# Patient Record
Sex: Female | Born: 1965 | Race: White | Hispanic: No | Marital: Married | Smoking: Never smoker
Health system: Southern US, Community
[De-identification: ages and names within clinical notes are randomized; demographics above are authoritative.]

## PROBLEM LIST (undated history)

## (undated) DIAGNOSIS — Z8619 Personal history of other infectious and parasitic diseases: Secondary | ICD-10-CM

## (undated) DIAGNOSIS — J449 Chronic obstructive pulmonary disease, unspecified: Secondary | ICD-10-CM

## (undated) DIAGNOSIS — R17 Unspecified jaundice: Secondary | ICD-10-CM

## (undated) DIAGNOSIS — K759 Inflammatory liver disease, unspecified: Secondary | ICD-10-CM

## (undated) DIAGNOSIS — IMO0001 Reserved for inherently not codable concepts without codable children: Secondary | ICD-10-CM

## (undated) DIAGNOSIS — D649 Anemia, unspecified: Secondary | ICD-10-CM

## (undated) DIAGNOSIS — F419 Anxiety disorder, unspecified: Secondary | ICD-10-CM

## (undated) DIAGNOSIS — K219 Gastro-esophageal reflux disease without esophagitis: Secondary | ICD-10-CM

## (undated) DIAGNOSIS — R51 Headache: Secondary | ICD-10-CM

## (undated) DIAGNOSIS — R011 Cardiac murmur, unspecified: Secondary | ICD-10-CM

## (undated) DIAGNOSIS — F32A Depression, unspecified: Secondary | ICD-10-CM

## (undated) DIAGNOSIS — E785 Hyperlipidemia, unspecified: Secondary | ICD-10-CM

## (undated) DIAGNOSIS — F329 Major depressive disorder, single episode, unspecified: Secondary | ICD-10-CM

## (undated) DIAGNOSIS — A389 Scarlet fever, uncomplicated: Secondary | ICD-10-CM

## (undated) DIAGNOSIS — J189 Pneumonia, unspecified organism: Secondary | ICD-10-CM

## (undated) DIAGNOSIS — Z9889 Other specified postprocedural states: Secondary | ICD-10-CM

## (undated) DIAGNOSIS — K760 Fatty (change of) liver, not elsewhere classified: Secondary | ICD-10-CM

## (undated) DIAGNOSIS — I10 Essential (primary) hypertension: Secondary | ICD-10-CM

## (undated) DIAGNOSIS — M1990 Unspecified osteoarthritis, unspecified site: Secondary | ICD-10-CM

## (undated) DIAGNOSIS — R112 Nausea with vomiting, unspecified: Secondary | ICD-10-CM

## (undated) DIAGNOSIS — R519 Headache, unspecified: Secondary | ICD-10-CM

## (undated) HISTORY — DX: Essential (primary) hypertension: I10

## (undated) HISTORY — DX: Fatty (change of) liver, not elsewhere classified: K76.0

## (undated) HISTORY — PX: APPENDECTOMY: SHX54

## (undated) HISTORY — DX: Hyperlipidemia, unspecified: E78.5

## (undated) HISTORY — DX: Depression, unspecified: F32.A

## (undated) HISTORY — DX: Chronic obstructive pulmonary disease, unspecified: J44.9

## (undated) HISTORY — DX: Gastro-esophageal reflux disease without esophagitis: K21.9

## (undated) HISTORY — DX: Major depressive disorder, single episode, unspecified: F32.9

## (undated) HISTORY — PX: KNEE SURGERY: SHX244

## (undated) HISTORY — PX: CHOLECYSTECTOMY: SHX55

## (undated) HISTORY — DX: Anemia, unspecified: D64.9

## (undated) HISTORY — DX: Anxiety disorder, unspecified: F41.9

---
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
## 1998-02-24 HISTORY — PX: DILATION AND CURETTAGE OF UTERUS: SHX78

## 1998-10-26 ENCOUNTER — Other Ambulatory Visit: Admission: RE | Admit: 1998-10-26 | Discharge: 1998-10-26 | Payer: Self-pay | Admitting: Obstetrics and Gynecology

## 1998-11-07 ENCOUNTER — Ambulatory Visit (HOSPITAL_COMMUNITY): Admission: RE | Admit: 1998-11-07 | Discharge: 1998-11-07 | Payer: Self-pay | Admitting: Obstetrics and Gynecology

## 1998-11-07 ENCOUNTER — Encounter (INDEPENDENT_AMBULATORY_CARE_PROVIDER_SITE_OTHER): Payer: Self-pay

## 2000-06-10 ENCOUNTER — Other Ambulatory Visit: Admission: RE | Admit: 2000-06-10 | Discharge: 2000-06-10 | Payer: Self-pay | Admitting: *Deleted

## 2000-06-23 ENCOUNTER — Ambulatory Visit (HOSPITAL_COMMUNITY): Admission: RE | Admit: 2000-06-23 | Discharge: 2000-06-23 | Payer: Self-pay | Admitting: *Deleted

## 2000-06-23 ENCOUNTER — Encounter: Payer: Self-pay | Admitting: *Deleted

## 2002-11-04 ENCOUNTER — Encounter: Payer: Self-pay | Admitting: Obstetrics and Gynecology

## 2002-11-04 ENCOUNTER — Ambulatory Visit (HOSPITAL_COMMUNITY): Admission: RE | Admit: 2002-11-04 | Discharge: 2002-11-04 | Payer: Self-pay | Admitting: Obstetrics and Gynecology

## 2005-04-11 ENCOUNTER — Encounter: Admission: RE | Admit: 2005-04-11 | Discharge: 2005-04-11 | Payer: Self-pay | Admitting: Obstetrics and Gynecology

## 2006-05-12 ENCOUNTER — Ambulatory Visit (HOSPITAL_COMMUNITY): Admission: RE | Admit: 2006-05-12 | Discharge: 2006-05-12 | Payer: Self-pay | Admitting: Obstetrics and Gynecology

## 2007-05-27 ENCOUNTER — Ambulatory Visit (HOSPITAL_COMMUNITY): Admission: RE | Admit: 2007-05-27 | Discharge: 2007-05-27 | Payer: Self-pay | Admitting: Obstetrics and Gynecology

## 2010-03-17 ENCOUNTER — Encounter: Payer: Self-pay | Admitting: Obstetrics and Gynecology

## 2010-04-15 ENCOUNTER — Other Ambulatory Visit: Payer: Self-pay | Admitting: Obstetrics and Gynecology

## 2010-04-15 DIAGNOSIS — Z1231 Encounter for screening mammogram for malignant neoplasm of breast: Secondary | ICD-10-CM

## 2010-05-03 ENCOUNTER — Ambulatory Visit
Admission: RE | Admit: 2010-05-03 | Discharge: 2010-05-03 | Disposition: A | Discharge status: Home / Self Care | Payer: BC Managed Care – PPO | Source: Ambulatory Visit | Attending: Obstetrics and Gynecology | Admitting: Obstetrics and Gynecology

## 2010-05-03 DIAGNOSIS — Z1231 Encounter for screening mammogram for malignant neoplasm of breast: Secondary | ICD-10-CM

## 2010-12-25 ENCOUNTER — Other Ambulatory Visit: Payer: Self-pay | Admitting: Obstetrics and Gynecology

## 2010-12-25 DIAGNOSIS — N6324 Unspecified lump in the left breast, lower inner quadrant: Secondary | ICD-10-CM

## 2011-01-08 ENCOUNTER — Ambulatory Visit
Admission: RE | Admit: 2011-01-08 | Discharge: 2011-01-08 | Disposition: A | Discharge status: Home / Self Care | Payer: BC Managed Care – PPO | Source: Ambulatory Visit | Attending: Obstetrics and Gynecology | Admitting: Obstetrics and Gynecology

## 2011-01-08 ENCOUNTER — Other Ambulatory Visit: Payer: Self-pay | Admitting: Obstetrics and Gynecology

## 2011-01-08 DIAGNOSIS — N6324 Unspecified lump in the left breast, lower inner quadrant: Secondary | ICD-10-CM

## 2011-07-03 DIAGNOSIS — K219 Gastro-esophageal reflux disease without esophagitis: Secondary | ICD-10-CM | POA: Insufficient documentation

## 2011-08-14 DIAGNOSIS — F32A Depression, unspecified: Secondary | ICD-10-CM | POA: Insufficient documentation

## 2011-10-02 ENCOUNTER — Other Ambulatory Visit (HOSPITAL_COMMUNITY): Payer: Self-pay | Admitting: Obstetrics and Gynecology

## 2011-10-02 DIAGNOSIS — Z1231 Encounter for screening mammogram for malignant neoplasm of breast: Secondary | ICD-10-CM

## 2011-10-16 ENCOUNTER — Ambulatory Visit (HOSPITAL_COMMUNITY): Payer: BC Managed Care – PPO

## 2013-03-07 ENCOUNTER — Other Ambulatory Visit: Payer: Self-pay

## 2013-03-07 DIAGNOSIS — Z1231 Encounter for screening mammogram for malignant neoplasm of breast: Secondary | ICD-10-CM

## 2013-03-28 ENCOUNTER — Ambulatory Visit
Admission: RE | Admit: 2013-03-28 | Discharge: 2013-03-28 | Disposition: A | Discharge status: Home / Self Care | Payer: Federal, State, Local not specified - PPO | Source: Ambulatory Visit

## 2013-03-28 DIAGNOSIS — Z1231 Encounter for screening mammogram for malignant neoplasm of breast: Secondary | ICD-10-CM

## 2013-04-01 ENCOUNTER — Other Ambulatory Visit: Payer: Self-pay | Admitting: Obstetrics and Gynecology

## 2013-04-01 DIAGNOSIS — R928 Other abnormal and inconclusive findings on diagnostic imaging of breast: Secondary | ICD-10-CM

## 2013-04-07 ENCOUNTER — Ambulatory Visit
Admission: RE | Admit: 2013-04-07 | Discharge: 2013-04-07 | Disposition: A | Discharge status: Home / Self Care | Payer: Federal, State, Local not specified - PPO | Source: Ambulatory Visit | Attending: Obstetrics and Gynecology | Admitting: Obstetrics and Gynecology

## 2013-04-07 DIAGNOSIS — R928 Other abnormal and inconclusive findings on diagnostic imaging of breast: Secondary | ICD-10-CM

## 2013-11-03 ENCOUNTER — Other Ambulatory Visit: Payer: Self-pay | Admitting: Obstetrics and Gynecology

## 2013-11-03 DIAGNOSIS — N6001 Solitary cyst of right breast: Secondary | ICD-10-CM

## 2013-11-10 ENCOUNTER — Ambulatory Visit
Admission: RE | Admit: 2013-11-10 | Discharge: 2013-11-10 | Disposition: A | Discharge status: Home / Self Care | Payer: Federal, State, Local not specified - PPO | Source: Ambulatory Visit | Attending: Obstetrics and Gynecology | Admitting: Obstetrics and Gynecology

## 2013-11-10 DIAGNOSIS — N6001 Solitary cyst of right breast: Secondary | ICD-10-CM

## 2014-02-28 DIAGNOSIS — G4710 Hypersomnia, unspecified: Secondary | ICD-10-CM | POA: Insufficient documentation

## 2014-02-28 DIAGNOSIS — R0683 Snoring: Secondary | ICD-10-CM | POA: Insufficient documentation

## 2014-02-28 DIAGNOSIS — R5383 Other fatigue: Secondary | ICD-10-CM | POA: Insufficient documentation

## 2014-07-19 ENCOUNTER — Other Ambulatory Visit: Payer: Self-pay | Admitting: Obstetrics and Gynecology

## 2014-07-19 DIAGNOSIS — N63 Unspecified lump in unspecified breast: Secondary | ICD-10-CM

## 2014-07-26 ENCOUNTER — Ambulatory Visit
Admission: RE | Admit: 2014-07-26 | Discharge: 2014-07-26 | Disposition: A | Discharge status: Home / Self Care | Payer: Federal, State, Local not specified - PPO | Source: Ambulatory Visit | Attending: Obstetrics and Gynecology | Admitting: Obstetrics and Gynecology

## 2014-07-26 DIAGNOSIS — N63 Unspecified lump in unspecified breast: Secondary | ICD-10-CM

## 2014-11-13 ENCOUNTER — Ambulatory Visit: Payer: Federal, State, Local not specified - PPO | Admitting: Internal Medicine

## 2014-12-15 ENCOUNTER — Other Ambulatory Visit (INDEPENDENT_AMBULATORY_CARE_PROVIDER_SITE_OTHER): Payer: Federal, State, Local not specified - PPO

## 2014-12-15 ENCOUNTER — Encounter: Payer: Self-pay | Admitting: Internal Medicine

## 2014-12-15 ENCOUNTER — Ambulatory Visit (INDEPENDENT_AMBULATORY_CARE_PROVIDER_SITE_OTHER): Payer: Federal, State, Local not specified - PPO | Admitting: Internal Medicine

## 2014-12-15 DIAGNOSIS — I10 Essential (primary) hypertension: Secondary | ICD-10-CM | POA: Diagnosis not present

## 2014-12-15 DIAGNOSIS — Z1211 Encounter for screening for malignant neoplasm of colon: Secondary | ICD-10-CM

## 2014-12-15 LAB — COMPREHENSIVE METABOLIC PANEL
ALBUMIN: 4.2 g/dL (ref 3.5–5.2)
ALT: 17 U/L (ref 0–35)
AST: 16 U/L (ref 0–37)
Alkaline Phosphatase: 70 U/L (ref 39–117)
BUN: 11 mg/dL (ref 6–23)
CHLORIDE: 103 meq/L (ref 96–112)
CO2: 29 meq/L (ref 19–32)
CREATININE: 0.73 mg/dL (ref 0.40–1.20)
Calcium: 9.5 mg/dL (ref 8.4–10.5)
GFR: 89.66 mL/min (ref >60.00)
GLUCOSE: 97 mg/dL (ref 70–99)
POTASSIUM: 4 meq/L (ref 3.5–5.1)
SODIUM: 142 meq/L (ref 135–145)
Total Bilirubin: 0.4 mg/dL (ref 0.2–1.2)
Total Protein: 7.7 g/dL (ref 6.0–8.3)

## 2014-12-15 LAB — LIPID PANEL
CHOL/HDL RATIO: 6
CHOLESTEROL: 235 mg/dL — AB (ref 0–200)
HDL: 40.3 mg/dL (ref >39.00)
LDL CALC: 164 mg/dL — AB (ref 0–99)
NonHDL: 195.14
TRIGLYCERIDES: 158 mg/dL — AB (ref 0.0–149.0)
VLDL: 31.6 mg/dL (ref 0.0–40.0)

## 2014-12-15 LAB — CBC
HEMATOCRIT: 39 % (ref 36.0–46.0)
HEMOGLOBIN: 12.4 g/dL (ref 12.0–15.0)
MCHC: 31.9 g/dL (ref 30.0–36.0)
MCV: 82.8 fl (ref 78.0–100.0)
Platelets: 315 10*3/uL (ref 150.0–400.0)
RBC: 4.71 Mil/uL (ref 3.87–5.11)
RDW: 17.1 % — AB (ref 11.5–15.5)
WBC: 9.9 10*3/uL (ref 4.0–10.5)

## 2014-12-15 LAB — HEMOGLOBIN A1C: HEMOGLOBIN A1C: 5.9 % (ref 4.6–6.5)

## 2014-12-15 LAB — TSH: TSH: 2.99 u[IU]/mL (ref 0.35–4.50)

## 2014-12-15 MED ORDER — SERTRALINE HCL 50 MG PO TABS: 50.0000 mg | ORAL_TABLET | Freq: Every day | ORAL | Status: DC

## 2014-12-15 NOTE — Progress Notes (Signed)
Pre visit review using our clinic review tool, if applicable. No additional management support is needed unless otherwise documented below in the visit note. 

## 2014-12-15 NOTE — Progress Notes (Signed)
   Subjective:    Patient ID: Christine Ramirez, female    DOB: Jun 21, 1965, 49 y.o.   MRN: [id number]  HPI The patient is a new 49 YO female coming in for her morbid obesity. This is limiting her ADLS (cannot tie her shoes) and her mobility (no energy to do anything and legs weak and not well able to support her). She has been told in the past that she had borderline sugars (have not been checked in almost a year). She does eat poorly and does not feel able to resist cravings for sodas and foods. She does work with limited access to food preparation and is forced to eat foods that are not good for her at work. Her husband has recently had cancer which caused a lot of stress and stress eating.   Review of Systems  Constitutional: Positive for activity change, appetite change and unexpected weight change. Negative for fever, chills and fatigue.  HENT: Negative.   Eyes: Negative.   Respiratory: Positive for shortness of breath. Negative for cough, chest tightness and wheezing.   Cardiovascular: Negative for chest pain, palpitations and leg swelling.  Gastrointestinal: Negative for abdominal pain, diarrhea, constipation, blood in stool and abdominal distention.  Musculoskeletal: Positive for myalgias, back pain, arthralgias and gait problem.  Skin: Negative.   Neurological: Negative.   Psychiatric/Behavioral: Negative.       Objective:   Physical Exam  Constitutional: She is oriented to person, place, and time. She appears well-developed and well-nourished.  Overweight  HENT:  Head: Normocephalic and atraumatic.  Eyes: EOM are normal.  Neck: Normal range of motion.  Cardiovascular: Normal rate and regular rhythm.   Pulmonary/Chest: Effort normal. No respiratory distress. She has no wheezes. She has no rales.  Decreased sounds due to habitus  Abdominal: Soft. Bowel sounds are normal. She exhibits no distension. There is no tenderness. There is no rebound.  Musculoskeletal: She exhibits no  edema.  Neurological: She is alert and oriented to person, place, and time. Coordination normal.  Skin: Skin is warm and dry.  Psychiatric: She has a normal mood and affect.   Filed Vitals:   12/15/14 1535  BP: 134/82  Pulse: 74  Temp: 98.1 F (36.7 C)  TempSrc: Oral  Resp: 20  Height: 5\' 1"  (1.549 m)  Weight: 296 lb 1.9 oz (134.319 kg)  SpO2: 98%      Assessment & Plan:

## 2014-12-15 NOTE — Patient Instructions (Signed)
We will check the blood work and have given you the information about the bariatric clinic. Their website is on the bottom of the page.

## 2014-12-15 NOTE — Assessment & Plan Note (Signed)
Currently controlled on lisinopril 5 mg daily and checking labs today. Adjust as needed.

## 2014-12-15 NOTE — Assessment & Plan Note (Signed)
Given information about the bariatric center at [facility]. Checking labs today but complicated by osteoarthritis and likely OSA (has not had sleep study but suspected, not treated), and at least impaired fasting sugars. She does not feel able to start the journey since she has so far to go. Talked to her about more activity to build up her strength. She has thought about bariatric surgery in the past.

## 2014-12-18 LAB — T4, FREE: Free T4: 0.68 ng/dL (ref 0.60–1.60)

## 2014-12-18 LAB — VITAMIN B12: Vitamin B-12: 133 pg/mL — ABNORMAL LOW (ref 211–911)

## 2014-12-27 ENCOUNTER — Telehealth: Payer: Self-pay | Admitting: Internal Medicine

## 2014-12-27 NOTE — Telephone Encounter (Signed)
Her referral was placed for colonoscopy at her visit and it can take several weeks to hear back about scheduling. We did check for thyroid conditions and they were normal. Would recommend taking over the counter B12 1000 mcg daily to help with low B12 level.

## 2014-12-27 NOTE — Telephone Encounter (Signed)
Patient requesting referral for a colonoscopy.  She is also wanting to know if Dr. Okey Duprerawford tested for Thyroid conditions and if so what the results were.  Also, Dr. Okey Duprerawford stated that her B12 was low. Does she need to do or take anything in regard to this or not worry about it at this time?

## 2014-12-27 NOTE — Telephone Encounter (Signed)
Patient aware and will wait to hear about her colonoscopy and will go pick up the OTC B12 .

## 2015-01-12 ENCOUNTER — Encounter: Payer: Self-pay | Admitting: Gastroenterology

## 2015-01-26 ENCOUNTER — Other Ambulatory Visit (INDEPENDENT_AMBULATORY_CARE_PROVIDER_SITE_OTHER): Payer: Federal, State, Local not specified - PPO

## 2015-01-26 ENCOUNTER — Encounter: Payer: Self-pay | Admitting: Internal Medicine

## 2015-01-26 ENCOUNTER — Ambulatory Visit (INDEPENDENT_AMBULATORY_CARE_PROVIDER_SITE_OTHER): Payer: Federal, State, Local not specified - PPO | Admitting: Internal Medicine

## 2015-01-26 VITALS — BP 126/82 | HR 79 | Temp 97.3°F | Resp 20 | Ht 62.0 in | Wt 302.2 lb

## 2015-01-26 DIAGNOSIS — J209 Acute bronchitis, unspecified: Secondary | ICD-10-CM | POA: Diagnosis not present

## 2015-01-26 DIAGNOSIS — R1011 Right upper quadrant pain: Secondary | ICD-10-CM

## 2015-01-26 LAB — CBC WITH DIFFERENTIAL/PLATELET
BASOS ABS: 0 10*3/uL (ref 0.0–0.1)
Basophils Relative: 0.4 % (ref 0.0–3.0)
EOS ABS: 0.2 10*3/uL (ref 0.0–0.7)
Eosinophils Relative: 2.5 % (ref 0.0–5.0)
HEMATOCRIT: 38.2 % (ref 36.0–46.0)
HEMOGLOBIN: 12.3 g/dL (ref 12.0–15.0)
LYMPHS PCT: 20.9 % (ref 12.0–46.0)
Lymphs Abs: 2 10*3/uL (ref 0.7–4.0)
MCHC: 32.3 g/dL (ref 30.0–36.0)
MCV: 82.7 fl (ref 78.0–100.0)
MONOS PCT: 7.1 % (ref 3.0–12.0)
Monocytes Absolute: 0.7 10*3/uL (ref 0.1–1.0)
NEUTROS ABS: 6.5 10*3/uL (ref 1.4–7.7)
Neutrophils Relative %: 69.1 % (ref 43.0–77.0)
Platelets: 330 10*3/uL (ref 150.0–400.0)
RBC: 4.61 Mil/uL (ref 3.87–5.11)
RDW: 17.2 % — ABNORMAL HIGH (ref 11.5–15.5)
WBC: 9.3 10*3/uL (ref 4.0–10.5)

## 2015-01-26 LAB — COMPREHENSIVE METABOLIC PANEL
ALBUMIN: 3.9 g/dL (ref 3.5–5.2)
ALT: 17 U/L (ref 0–35)
AST: 14 U/L (ref 0–37)
Alkaline Phosphatase: 65 U/L (ref 39–117)
BUN: 10 mg/dL (ref 6–23)
CALCIUM: 9.2 mg/dL (ref 8.4–10.5)
CHLORIDE: 105 meq/L (ref 96–112)
CO2: 27 mEq/L (ref 19–32)
CREATININE: 0.84 mg/dL (ref 0.40–1.20)
GFR: 76.22 mL/min (ref >60.00)
Glucose, Bld: 94 mg/dL (ref 70–99)
POTASSIUM: 4.2 meq/L (ref 3.5–5.1)
SODIUM: 139 meq/L (ref 135–145)
Total Bilirubin: 0.4 mg/dL (ref 0.2–1.2)
Total Protein: 7.4 g/dL (ref 6.0–8.3)

## 2015-01-26 LAB — URINALYSIS, ROUTINE W REFLEX MICROSCOPIC
Bilirubin Urine: NEGATIVE
KETONES UR: NEGATIVE
Leukocytes, UA: NEGATIVE
NITRITE: NEGATIVE
SPECIFIC GRAVITY, URINE: 1.02 (ref 1.000–1.030)
TOTAL PROTEIN, URINE-UPE24: NEGATIVE
URINE GLUCOSE: NEGATIVE
UROBILINOGEN UA: 0.2 (ref 0.0–1.0)
pH: 6 (ref 5.0–8.0)

## 2015-01-26 MED ORDER — DOXYCYCLINE HYCLATE 100 MG PO TABS: 100.0000 mg | ORAL_TABLET | Freq: Two times a day (BID) | ORAL | Status: DC

## 2015-01-26 NOTE — Progress Notes (Signed)
Pre visit review using our clinic review tool, if applicable. No additional management support is needed unless otherwise documented below in the visit note. 

## 2015-01-26 NOTE — Patient Instructions (Addendum)
   Test(s) ordered today. Your results will be released to MyChart (or called to you) after review, usually within 72hours after test completion. If any changes need to be made, you will be notified at that same time.  An antibiotic was sent to your pharmacy for possible bronchitis/pneumonia.  If your symptoms do not improve, call or return.   An ultrasound was ordered and we will call you to schedule.

## 2015-01-26 NOTE — Progress Notes (Signed)
Subjective:    Patient ID: Christine Ramirez, female    DOB: [birth year], 49 y.o.   MRN: [id number]  HPI She is here for an acute visit for abdominal pain, cough and shortness of breath.  Her abdominal pain started a month or so ago.  She noticed it more when she is more active and the past couple of days it has been more constant.  She denies radiation.  There is no relation to eating or type of food.  Nothing seems to help.   She has also been getting sob with activity.  She has chronic sob which she knows is likely related to her weight, but the past week it has been worse.  She has been coughing.   The cough is productive, but there is minimal phlegm.  She has heard some wheezing, but it is mild.  She has had bronchitis in the past and is concerned about the possibility of pneumonia, especially with the RUQ pain that could be at her lung base.       Medications and allergies reviewed with patient and not updated - she is currently also taking buprofen 800 mg daily to every other day for generalized pain.  Patient Active Problem List   Diagnosis Date Noted  . Morbid obesity (HCC) 12/15/2014  . Essential hypertension 12/15/2014    Current Outpatient Prescriptions on File Prior to Visit  Medication Sig Dispense Refill  . lisinopril (PRINIVIL,ZESTRIL) 5 MG tablet TAKE 1 TABLET BY MOUTH EVERY DAY    . sertraline (ZOLOFT) 50 MG tablet Take 1 tablet (50 mg total) by mouth daily. 90 tablet 3   No current facility-administered medications on file prior to visit.    Past Medical History  Diagnosis Date  . Depression   . Anxiety   . Anemia   . COPD (chronic obstructive pulmonary disease) (HCC)   . GERD (gastroesophageal reflux disease)   . Hyperlipidemia   . Hypertension     Past Surgical History  Procedure Laterality Date  . Appendectomy    . Knee surgery Right     Social History   Social History  . Marital Status: Married    Spouse Name: N/A  . Number of Children: N/A   . Years of Education: N/A   Social History Main Topics  . Smoking status: Never Smoker   . Smokeless tobacco: None  . Alcohol Use: No  . Drug Use: No  . Sexual Activity: Not Asked   Other Topics Concern  . None   Social History Narrative    Review of Systems  Constitutional: Positive for fatigue. Negative for fever and appetite change.  HENT: Positive for trouble swallowing (occasionally). Negative for congestion, ear pain, sinus pressure and sore throat.   Respiratory: Positive for cough, shortness of breath and wheezing.   Gastrointestinal: Positive for abdominal pain (dull, non radiated in RUQ) and blood in stool (likley hemorhoidal ). Negative for nausea, vomiting, diarrhea and constipation.       GERD 2-3 / month  Genitourinary: Negative for dysuria, frequency and hematuria.  Neurological: Positive for headaches. Negative for light-headedness.       Objective:   Filed Vitals:   01/26/15 1622  BP: 126/82  Pulse: 79  Temp: 97.3 F (36.3 C)  Resp: 20   Filed Weights   01/26/15 1622  Weight: 302 lb 4 oz (137.1 kg)   Body mass index is 55.27 kg/(m^2).   Physical Exam  Constitutional: She appears well-developed and well-nourished.  HENT:  Head: Normocephalic and atraumatic.  Right Ear: External ear normal.  Left Ear: External ear normal.  Mouth/Throat: Oropharynx is clear and moist.  Eyes: Conjunctivae are normal.  Neck: Neck supple. No tracheal deviation present. No thyromegaly present.  Cardiovascular: Normal rate, regular rhythm and normal heart sounds.   No murmur heard. Pulmonary/Chest: Effort normal and breath sounds normal. No stridor. No respiratory distress. She has no wheezes. She has no rales.  Moderate sob with exertion  - somewhat weight related  Abdominal: Soft. There is tenderness (RUQ). There is no rebound and no guarding.  Morbidly obese  Musculoskeletal: She exhibits edema (trace).  Lymphadenopathy:    She has no cervical adenopathy.         Assessment & Plan:   RUQ pain, sob (acute on chronic), wheeze, cough Her lung exam is normal and I do not think she has PNA, but she may have some bronchitis - we will start doxycyline today Her RUQ pain needs to be evaluated further  - she is mildly tender on exam Check urine for infection and blood to rule out UTI, kidney stone Check abdominal US to rule out fatty liver, gallstones or other liver pathology Check basic blood work Depending on how she feels after the medication and her above results we will determine if further evaluation is needed

## 2015-01-28 ENCOUNTER — Telehealth: Payer: Self-pay | Admitting: Internal Medicine

## 2015-01-28 DIAGNOSIS — R3129 Other microscopic hematuria: Secondary | ICD-10-CM

## 2015-01-28 LAB — URINE CULTURE

## 2015-01-28 NOTE — Telephone Encounter (Signed)
Let her know her urine shows some blood, but no obvious infection.  I would like her to give a repeat sample to recheck for blood.  (ordered).  Her kidney and liver tests are normal.  Her blood counts are normal.  Please see how she is feeling.

## 2015-01-29 NOTE — Telephone Encounter (Signed)
Called pt to inform. Pt will be coming in this week for UA. Pt is still having pain, she has scheduled the US for 02/08/15

## 2015-02-01 ENCOUNTER — Other Ambulatory Visit (INDEPENDENT_AMBULATORY_CARE_PROVIDER_SITE_OTHER): Payer: Federal, State, Local not specified - PPO

## 2015-02-01 DIAGNOSIS — R3129 Other microscopic hematuria: Secondary | ICD-10-CM

## 2015-02-01 LAB — URINALYSIS, ROUTINE W REFLEX MICROSCOPIC
BILIRUBIN URINE: NEGATIVE
Ketones, ur: NEGATIVE
NITRITE: NEGATIVE
PH: 6 (ref 5.0–8.0)
Specific Gravity, Urine: 1.025 (ref 1.000–1.030)
TOTAL PROTEIN, URINE-UPE24: NEGATIVE
URINE GLUCOSE: NEGATIVE
UROBILINOGEN UA: 0.2 (ref 0.0–1.0)

## 2015-02-02 LAB — URINE CULTURE: Colony Count: 30000

## 2015-02-04 ENCOUNTER — Telehealth: Payer: Self-pay | Admitting: Internal Medicine

## 2015-02-04 NOTE — Telephone Encounter (Signed)
Urine shows a little blood, it was not a great sample, but there was no obvious infection.  Any urinary symptoms now?  Is she still having the abdominal discomfort?

## 2015-02-05 NOTE — Telephone Encounter (Signed)
Spoke with pt. Pt stated that she has finished the antibiotic as of today and has an ultra sound scheduled for 02/08/15. Pt is still having abdominal pain. I instructed pt to call back if it became worse before her ultrasound appt.

## 2015-02-08 ENCOUNTER — Encounter (INDEPENDENT_AMBULATORY_CARE_PROVIDER_SITE_OTHER): Payer: Self-pay

## 2015-02-08 ENCOUNTER — Ambulatory Visit
Admission: RE | Admit: 2015-02-08 | Discharge: 2015-02-08 | Disposition: A | Discharge status: Home / Self Care | Payer: Federal, State, Local not specified - PPO | Source: Ambulatory Visit | Attending: Internal Medicine | Admitting: Internal Medicine

## 2015-02-08 DIAGNOSIS — R1011 Right upper quadrant pain: Secondary | ICD-10-CM

## 2015-02-09 ENCOUNTER — Telehealth: Payer: Self-pay | Admitting: Internal Medicine

## 2015-02-09 ENCOUNTER — Telehealth: Payer: Self-pay | Admitting: Emergency Medicine

## 2015-02-09 DIAGNOSIS — K8020 Calculus of gallbladder without cholecystitis without obstruction: Secondary | ICD-10-CM

## 2015-02-09 MED ORDER — HYDROCODONE-ACETAMINOPHEN 5-325 MG PO TABS: 1.0000 | ORAL_TABLET | ORAL | Status: DC | PRN

## 2015-02-09 NOTE — Telephone Encounter (Signed)
Pt aware that rx is ready for pick up  

## 2015-02-09 NOTE — Telephone Encounter (Signed)
Pt called into office okaying RX to be picked up by brother.

## 2015-02-09 NOTE — Telephone Encounter (Signed)
See result note.  Will refer to surgery - urgent referral given pain.  rx for vicodin printed.

## 2015-02-14 ENCOUNTER — Telehealth: Payer: Self-pay | Admitting: Emergency Medicine

## 2015-02-14 NOTE — Telephone Encounter (Signed)
Pt called regarding general surgery referral. I informed pt of appt date and time. Pt also requesting that a refill be sent in for her Lisinopril

## 2015-02-21 ENCOUNTER — Other Ambulatory Visit: Payer: Self-pay | Admitting: Emergency Medicine

## 2015-02-21 MED ORDER — LISINOPRIL 5 MG PO TABS: 5.0000 mg | ORAL_TABLET | Freq: Every day | ORAL | Status: DC

## 2015-03-01 ENCOUNTER — Telehealth: Payer: Self-pay

## 2015-03-01 NOTE — Telephone Encounter (Signed)
Plan on calling pt later in the morning.  If her wt is still up high enough to maintain BMI >50, would you like her to be a direct hospital case or OV first?  Thank you, Marylene Landngela

## 2015-03-01 NOTE — Telephone Encounter (Signed)
Office visit first would be good. thanks

## 2015-03-02 NOTE — Telephone Encounter (Signed)
Called pt.Informed pt we would need to schedule an office visit to discuss her colonoscopy due to elevated BMI.Pt states she has been having some medical problems with blood in her urine ,abd pain and she has gallstones. She is scheduled to see a surgeon next week to discuss surgery.  The pt wants to  take care of these other medical problems before scheduling a colonoscopy or office visit.She will call back to schedule an office visit with the doctor once she gets medical clearance.The pre-visit on 03/06/15 was cancelled.The pt understood.

## 2015-03-12 ENCOUNTER — Ambulatory Visit: Payer: Self-pay | Admitting: General Surgery

## 2015-03-12 NOTE — H&P (Signed)
Christine Ramirez 03/01/2015 9:52 AM [facility] Patient #: [id number] DOB: 08/10/1965 Married / Language: English / Race: White Female   History of Present Illness (Sheridan Hew M. Monroe Qin MD; 03/01/2015 10:24 AM) Patient words: GB.  The patient is a 49 year old female who presents with symptomatic choledocholithiasis. She is referred by Dr. Burns to discuss upper abdominal pain. She states that she has had several months of epigastric and right upper quadrant pain radiating to her back. It was initially intermittent but now it is more frequent and almost constant. It is now affecting her ability to work. She states she just hasn't felt good. She denies any fevers or chills. She denies any nausea or vomiting. She denies any jaundice. She has been taking some narcotics that her primary care physician gave her. Interestingly she states the pain is worse with activity. She does report that eating can also increase the discomfort. She also feels bloated. She has had a prior upper midline incision. She states that she had exploratory surgery when she was 50 years old because she had scarlet fever and hepatitis. She states that she had infection around her gallbladder at that time but they did not remove her gallbladder. They treated it with antibiotics. She does have shortness of breath with activity. She states that she probably has sleep apnea but was unable to attend the sleep study due to inclement weather   Problem List/Past Medical (Kevon Tench M Mikisha Roseland, MD; 03/01/2015 10:24 AM) SYMPTOMATIC CHOLELITHIASIS (K80.20)  Other Problems (Lucyann Romano M Jeani Fassnacht, MD; 03/01/2015 10:24 AM) Anxiety Disorder Arthritis Back Pain Cholelithiasis Chronic Obstructive Lung Disease Depression Gastroesophageal Reflux Disease Hepatitis High blood pressure Hypercholesterolemia Other disease, cancer, significant illness Sleep Apnea  Past Surgical History (Ammie Eversole, LPN; 03/01/2015  9:52 AM) Appendectomy Knee Surgery Right.  Diagnostic Studies History (Ammie Eversole, LPN; 03/01/2015 9:52 AM) Colonoscopy never Mammogram within last year Pap Smear 1-5 years ago  Allergies (Ammie Eversole, LPN; 03/01/2015 9:53 AM) No Known Drug Allergies01/06/2015  Medication History (Ammie Eversole, LPN; 03/01/2015 9:53 AM) Norco (5-325MG Tablet, Oral) Active. Lisinopril (5MG Tablet, Oral) Active. Zoloft (50MG Tablet, Oral) Active. Medications Reconciled  Social History (Ammie Eversole, LPN; 03/01/2015 9:52 AM) Alcohol use Occasional alcohol use. Caffeine use Carbonated beverages, Tea. No drug use Tobacco use Never smoker.  Family History (Ammie Eversole, LPN; 03/01/2015 9:52 AM) Alcohol Abuse Family Members In General. Arthritis Father, Mother. Bleeding disorder Family Members In General. Breast Cancer Family Members In General, Mother. Cancer Family Members In General. Melanoma Family Members In General, Mother.  Pregnancy / Birth History (Ammie Eversole, LPN; 03/01/2015 9:52 AM) Age at menarche 11 years. Gravida 1 Irregular periods Maternal age 31-35 Para 0    Review of Systems (Ammie Eversole LPN; 03/01/2015 9:52 AM) General Present- Appetite Loss, Chills, Fatigue and Night Sweats. Not Present- Fever, Weight Gain and Weight Loss. Skin Present- Dryness. Not Present- Change in Wart/Mole, Hives, Jaundice, New Lesions, Non-Healing Wounds, Rash and Ulcer. HEENT Present- Wears glasses/contact lenses. Not Present- Earache, Hearing Loss, Hoarseness, Nose Bleed, Oral Ulcers, Ringing in the Ears, Seasonal Allergies, Sinus Pain, Sore Throat, Visual Disturbances and Yellow Eyes. Respiratory Present- Snoring and Wheezing. Not Present- Bloody sputum, Chronic Cough and Difficulty Breathing. Cardiovascular Present- Leg Cramps, Shortness of Breath and Swelling of Extremities. Not Present- Chest Pain, Difficulty Breathing Lying Down, Palpitations and Rapid Heart  Rate. Gastrointestinal Present- Abdominal Pain, Bloating, Excessive gas, Gets full quickly at meals, Indigestion and Nausea. Not Present- Bloody Stool, Change in Bowel Habits, Chronic   diarrhea, Constipation, Difficulty Swallowing, Hemorrhoids, Rectal Pain and Vomiting. Female Genitourinary Present- Urgency. Not Present- Frequency, Nocturia, Painful Urination and Pelvic Pain. Musculoskeletal Present- Back Pain, Joint Pain, Joint Stiffness, Muscle Pain, Muscle Weakness and Swelling of Extremities. Neurological Present- Headaches, Numbness, Tingling and Weakness. Not Present- Decreased Memory, Fainting, Seizures, Tremor and Trouble walking. Psychiatric Not Present- Anxiety, Bipolar, Change in Sleep Pattern, Depression, Fearful and Frequent crying. Endocrine Not Present- Cold Intolerance, Excessive Hunger, Hair Changes, Heat Intolerance, Hot flashes and New Diabetes. Hematology Not Present- Easy Bruising, Excessive bleeding, Gland problems, HIV and Persistent Infections.  Vitals (Ammie Eversole LPN; 03/01/2015 9:52 AM) 03/01/2015 9:52 AM Weight: 303.2 lb Height: 61in Body Surface Area: 2.25 m Body Mass Index: 57.29 kg/m  Temp.: 97.6F(Oral)  Pulse: 72 (Regular)  BP: 124/74 (Sitting, Left Arm, Standard)       Physical Exam (Reily Ilic M. Deneisha Dade MD; 03/01/2015 10:21 AM) General Mental Status-Alert. General Appearance-Consistent with stated age. Hydration-Well hydrated. Voice-Normal. Note: morbidly obese   Head and Neck Head-normocephalic, atraumatic with no lesions or palpable masses. Trachea-midline. Thyroid Gland Characteristics - normal size and consistency.  Eye Eyeball - Bilateral-Extraocular movements intact. Sclera/Conjunctiva - Bilateral-No scleral icterus.  Chest and Lung Exam Chest and lung exam reveals -quiet, even and easy respiratory effort with no use of accessory muscles and on auscultation, normal breath sounds, no adventitious sounds and  normal vocal resonance. Inspection Chest Wall - Normal. Back - normal.  Breast - Did not examine.  Cardiovascular Cardiovascular examination reveals -normal heart sounds, regular rate and rhythm with no murmurs and normal pedal pulses bilaterally.  Abdomen Inspection Inspection of the abdomen reveals - No Hernias. Skin - Scar - Note: upper midline scar. Palpation/Percussion Palpation and Percussion of the abdomen reveal - Soft, Non Tender, No Rebound tenderness, No Rigidity (guarding) and No hepatosplenomegaly. Auscultation Auscultation of the abdomen reveals - Bowel sounds normal.  Peripheral Vascular Upper Extremity Palpation - Pulses bilaterally normal.  Neurologic Neurologic evaluation reveals -alert and oriented x 3 with no impairment of recent or remote memory. Mental Status-Normal.  Neuropsychiatric The patient's mood and affect are described as -normal. Judgment and Insight-insight is appropriate concerning matters relevant to self.  Musculoskeletal Normal Exam - Left-Upper Extremity Strength Normal and Lower Extremity Strength Normal. Normal Exam - Right-Upper Extremity Strength Normal and Lower Extremity Strength Normal.  Lymphatic Head & Neck  General Head & Neck Lymphatics: Bilateral - Description - Normal. Axillary - Did not examine. Femoral & Inguinal - Did not examine.    Assessment & Plan (Denzel Etienne M. Brennen Camper MD; 03/01/2015 10:24 AM) SYMPTOMATIC CHOLELITHIASIS (K80.20) Impression: I believe the patient's symptoms are consistent with gallbladder disease.  We discussed gallbladder disease. The patient was given educational material. We discussed non-operative and operative management. We discussed the signs & symptoms of acute cholecystitis  I discussed laparoscopic cholecystectomy with IOC in detail. The patient was given educational material as well as diagrams detailing the procedure. We discussed the risks and benefits of a laparoscopic  cholecystectomy including, but not limited to bleeding, infection, injury to surrounding structures such as the intestine or liver, bile leak, retained gallstones, need to convert to an open procedure, prolonged diarrhea, blood clots such as DVT, common bile duct injury, anesthesia risks, and possible need for additional procedures. We did discuss because of her prior upper midline incision that she is slightly higher risk for conversion to open as well as injury to surrounding structures. We discussed the typical post-operative recovery course. I explained that the likelihood of improvement   of their symptoms is good.  The patient has elected to proceed with surgery. I did encourage her to follow with her PCP about getting rescheduled for sleep study Current Plans You are being scheduled for surgery - Our schedulers will call you.  You should hear from our office's scheduling department within 5 working days about the location, date, and time of surgery. We try to make accommodations for patient's preferences in scheduling surgery, but sometimes the OR schedule or the surgeon's schedule prevents us from making those accommodations.  If you have not heard from our office ([phone]) in 5 working days, call the office and ask for your surgeon's nurse.  If you have other questions about your diagnosis, plan, or surgery, call the office and ask for your surgeon's nurse.  Pt Education - Pamphlet Given - Laparoscopic Gallbladder Surgery: discussed with patient and provided information. Pt Education - [facility] Laparosopic Post Op HCI ([place])  Shahir Karen M. Maximino Cozzolino, MD, FACS General, Bariatric, & Minimally Invasive Surgery [facility]  

## 2015-03-13 NOTE — Patient Instructions (Addendum)
DAISIE HAFT  03/13/2015   Your procedure is scheduled on: Monday 03/19/2015  Report to [facility] Main  Entrance take [place]  elevators to 3rd floor to  Short Stay Center at  0745  AM.  Call this number if you have problems the morning of surgery [phone]   Remember: ONLY 1 PERSON MAY GO WITH YOU TO SHORT STAY TO GET  READY MORNING OF YOUR SURGERY.   Do not eat food or drink liquids :After Midnight.     Take these medicines the morning of surgery with A SIP OF WATER: none                               You may not have any metal on your body including hair pins and              piercings  Do not wear jewelry, make-up, lotions, powders or perfumes, deodorant             Do not wear nail polish.  Do not shave  48 hours prior to surgery.              Men may shave face and neck.   Do not bring valuables to the hospital. [facility] IS NOT             RESPONSIBLE   FOR VALUABLES.  Contacts, dentures or bridgework may not be worn into surgery.  Leave suitcase in the car. After surgery it may be brought to your room.                   Please read over the following fact sheets you were given: _____________________________________________________________________             [facility] - Preparing for Surgery Before surgery, you can play an important role.  Because skin is not sterile, your skin needs to be as free of germs as possible.  You can reduce the number of germs on your skin by washing with CHG (chlorahexidine gluconate) soap before surgery.  CHG is an antiseptic cleaner which kills germs and bonds with the skin to continue killing germs even after washing. Please DO NOT use if you have an allergy to CHG or antibacterial soaps.  If your skin becomes reddened/irritated stop using the CHG and inform your nurse when you arrive at Short Stay. Do not shave (including legs and underarms) for at least 48 hours prior to the first CHG shower.  You may  shave your face/neck. Please follow these instructions carefully:  1.  Shower with CHG Soap the night before surgery and the  morning of Surgery.  2.  If you choose to wash your hair, wash your hair first as usual with your  normal  shampoo.  3.  After you shampoo, rinse your hair and body thoroughly to remove the  shampoo.                           4.  Use CHG as you would any other liquid soap.  You can apply chg directly  to the skin and wash                       Gently with a scrungie or clean washcloth.  5.  Apply the CHG Soap to your body ONLY FROM THE NECK DOWN.   Do not use on face/ open                           Wound or open sores. Avoid contact with eyes, ears mouth and genitals (private parts).                       Wash face,  Genitals (private parts) with your normal soap.             6.  Wash thoroughly, paying special attention to the area where your surgery  will be performed.  7.  Thoroughly rinse your body with warm water from the neck down.  8.  DO NOT shower/wash with your normal soap after using and rinsing off  the CHG Soap.                9.  Pat yourself dry with a clean towel.            10.  Wear clean pajamas.            11.  Place clean sheets on your bed the night of your first shower and do not  sleep with pets. Day of Surgery : Do not apply any lotions/deodorants the morning of surgery.  Please wear clean clothes to the hospital/surgery center.  FAILURE TO FOLLOW THESE INSTRUCTIONS MAY RESULT IN THE CANCELLATION OF YOUR SURGERY PATIENT SIGNATURE_________________________________  NURSE SIGNATURE__________________________________  ________________________________________________________________________

## 2015-03-14 ENCOUNTER — Encounter (HOSPITAL_COMMUNITY): Payer: Self-pay

## 2015-03-14 ENCOUNTER — Ambulatory Visit (HOSPITAL_COMMUNITY)
Admission: RE | Admit: 2015-03-14 | Discharge: 2015-03-14 | Disposition: A | Discharge status: Home / Self Care | Payer: Federal, State, Local not specified - PPO | Source: Ambulatory Visit | Attending: Anesthesiology | Admitting: Anesthesiology

## 2015-03-14 ENCOUNTER — Encounter (HOSPITAL_COMMUNITY)
Admission: RE | Admit: 2015-03-14 | Discharge: 2015-03-14 | Disposition: A | Discharge status: Home / Self Care | Payer: Federal, State, Local not specified - PPO | Source: Ambulatory Visit | Attending: General Surgery | Admitting: General Surgery

## 2015-03-14 DIAGNOSIS — R918 Other nonspecific abnormal finding of lung field: Secondary | ICD-10-CM | POA: Insufficient documentation

## 2015-03-14 DIAGNOSIS — R0602 Shortness of breath: Secondary | ICD-10-CM

## 2015-03-14 DIAGNOSIS — I10 Essential (primary) hypertension: Secondary | ICD-10-CM | POA: Diagnosis not present

## 2015-03-14 HISTORY — DX: Reserved for inherently not codable concepts without codable children: IMO0001

## 2015-03-14 HISTORY — DX: Personal history of other infectious and parasitic diseases: Z86.19

## 2015-03-14 HISTORY — DX: Pneumonia, unspecified organism: J18.9

## 2015-03-14 HISTORY — DX: Unspecified jaundice: R17

## 2015-03-14 HISTORY — DX: Inflammatory liver disease, unspecified: K75.9

## 2015-03-14 HISTORY — DX: Scarlet fever, uncomplicated: A38.9

## 2015-03-14 LAB — CBC
HCT: 35 % — ABNORMAL LOW (ref 36.0–46.0)
HEMOGLOBIN: 10.8 g/dL — AB (ref 12.0–15.0)
MCH: 26.5 pg (ref 26.0–34.0)
MCHC: 30.9 g/dL (ref 30.0–36.0)
MCV: 86 fL (ref 78.0–100.0)
Platelets: 280 10*3/uL (ref 150–400)
RBC: 4.07 MIL/uL (ref 3.87–5.11)
RDW: 15.3 % (ref 11.5–15.5)
WBC: 6.6 10*3/uL (ref 4.0–10.5)

## 2015-03-14 LAB — BASIC METABOLIC PANEL
ANION GAP: 8 (ref 5–15)
BUN: 9 mg/dL (ref 6–20)
CHLORIDE: 108 mmol/L (ref 101–111)
CO2: 25 mmol/L (ref 22–32)
CREATININE: 0.59 mg/dL (ref 0.44–1.00)
Calcium: 9.1 mg/dL (ref 8.9–10.3)
GFR calc non Af Amer: >60 mL/min (ref >60)
GLUCOSE: 94 mg/dL (ref 65–99)
Potassium: 4.1 mmol/L (ref 3.5–5.1)
Sodium: 141 mmol/L (ref 135–145)

## 2015-03-14 LAB — HCG, SERUM, QUALITATIVE: Preg, Serum: NEGATIVE

## 2015-03-14 NOTE — Progress Notes (Signed)
   03/14/15 1338  OBSTRUCTIVE SLEEP APNEA  Have you ever been diagnosed with sleep apnea through a sleep study? No  Do you snore loudly (loud enough to be heard through closed doors)?  1  Do you often feel tired, fatigued, or sleepy during the daytime (such as falling asleep during driving or talking to someone)? 1  Has anyone observed you stop breathing during your sleep? 0  Do you have, or are you being treated for high blood pressure? 1  BMI more than 35 kg/m2? 1  Age > 50 (1-yes) 0  Neck circumference greater than:Female 16 inches or larger, Female 17inches or larger? 1  Female Gender (Yes=1) 0  Obstructive Sleep Apnea Score 5  Score 5 or greater  Results sent to PCP

## 2015-03-16 NOTE — Anesthesia Preprocedure Evaluation (Addendum)
Anesthesia Evaluation  Patient identified by MRN, date of birth, ID band Patient awake    Reviewed: Allergy & Precautions, NPO status , Patient's Chart, lab work & pertinent test results  Airway Mallampati: II   Neck ROM: Full    Dental  (+) Teeth Intact, Dental Advisory Given,    Pulmonary neg pulmonary ROS, shortness of breath, COPD,    breath sounds clear to auscultation       Cardiovascular hypertension, Pt. on medications negative cardio ROS   Rhythm:Regular  EKG 02/2015 OK   Neuro/Psych Anxiety Depression negative neurological ROS  negative psych ROS   GI/Hepatic negative GI ROS, Neg liver ROS, GERD  ,LFT normal, albumin 3.9   Endo/Other  negative endocrine ROSMorbid obesityBMI 58  Renal/GU negative Renal ROS  negative genitourinary   Musculoskeletal negative musculoskeletal ROS (+)   Abdominal (+) + obese,   Peds negative pediatric ROS (+)  Hematology negative hematology ROS (+) 11/35   Anesthesia Other Findings   Reproductive/Obstetrics negative OB ROS                          Anesthesia Physical Anesthesia Plan  ASA: IV  Anesthesia Plan: General   Post-op Pain Management:    Induction: Intravenous, Rapid sequence and Cricoid pressure planned  Airway Management Planned: Oral ETT and Video Laryngoscope Planned  Additional Equipment:   Intra-op Plan:   Post-operative Plan: Extubation in OR  Informed Consent: I have reviewed the patients History and Physical, chart, labs and discussed the procedure including the risks, benefits and alternatives for the proposed anesthesia with the patient or authorized representative who has indicated his/her understanding and acceptance.     Plan Discussed with:   Anesthesia Plan Comments: (Modified RS Induction, Multi modal pain RX, limit narcotics, Ofirmev OK,  extubate sand recover 45 degree head up, nasal trumpet after induction)        Anesthesia Quick Evaluation

## 2015-03-18 DIAGNOSIS — G8929 Other chronic pain: Secondary | ICD-10-CM | POA: Insufficient documentation

## 2015-03-19 ENCOUNTER — Encounter: Payer: Federal, State, Local not specified - PPO | Admitting: Gastroenterology

## 2015-03-19 ENCOUNTER — Encounter (HOSPITAL_COMMUNITY)
Admission: RE | Disposition: A | Discharge status: Home / Self Care | Payer: Self-pay | Source: Ambulatory Visit | Attending: General Surgery

## 2015-03-19 ENCOUNTER — Ambulatory Visit (HOSPITAL_COMMUNITY): Payer: Federal, State, Local not specified - PPO | Admitting: Anesthesiology

## 2015-03-19 ENCOUNTER — Ambulatory Visit (HOSPITAL_COMMUNITY)
Admission: RE | Admit: 2015-03-19 | Discharge: 2015-03-19 | Disposition: A | Discharge status: Home / Self Care | Payer: Federal, State, Local not specified - PPO | Source: Ambulatory Visit | Attending: General Surgery | Admitting: General Surgery

## 2015-03-19 ENCOUNTER — Encounter (HOSPITAL_COMMUNITY): Payer: Self-pay | Admitting: *Deleted

## 2015-03-19 ENCOUNTER — Ambulatory Visit (HOSPITAL_COMMUNITY): Payer: Federal, State, Local not specified - PPO

## 2015-03-19 DIAGNOSIS — Z6841 Body Mass Index (BMI) 40.0 and over, adult: Secondary | ICD-10-CM | POA: Diagnosis not present

## 2015-03-19 DIAGNOSIS — J449 Chronic obstructive pulmonary disease, unspecified: Secondary | ICD-10-CM | POA: Diagnosis not present

## 2015-03-19 DIAGNOSIS — K660 Peritoneal adhesions (postprocedural) (postinfection): Secondary | ICD-10-CM | POA: Insufficient documentation

## 2015-03-19 DIAGNOSIS — K759 Inflammatory liver disease, unspecified: Secondary | ICD-10-CM | POA: Diagnosis not present

## 2015-03-19 DIAGNOSIS — E7800 Pure hypercholesterolemia, unspecified: Secondary | ICD-10-CM | POA: Insufficient documentation

## 2015-03-19 DIAGNOSIS — I10 Essential (primary) hypertension: Secondary | ICD-10-CM | POA: Insufficient documentation

## 2015-03-19 DIAGNOSIS — K760 Fatty (change of) liver, not elsewhere classified: Secondary | ICD-10-CM | POA: Insufficient documentation

## 2015-03-19 DIAGNOSIS — Z79891 Long term (current) use of opiate analgesic: Secondary | ICD-10-CM | POA: Diagnosis not present

## 2015-03-19 DIAGNOSIS — Q441 Other congenital malformations of gallbladder: Secondary | ICD-10-CM | POA: Insufficient documentation

## 2015-03-19 DIAGNOSIS — K219 Gastro-esophageal reflux disease without esophagitis: Secondary | ICD-10-CM | POA: Insufficient documentation

## 2015-03-19 DIAGNOSIS — F329 Major depressive disorder, single episode, unspecified: Secondary | ICD-10-CM | POA: Insufficient documentation

## 2015-03-19 DIAGNOSIS — G4730 Sleep apnea, unspecified: Secondary | ICD-10-CM | POA: Diagnosis not present

## 2015-03-19 DIAGNOSIS — M1990 Unspecified osteoarthritis, unspecified site: Secondary | ICD-10-CM | POA: Insufficient documentation

## 2015-03-19 DIAGNOSIS — Z419 Encounter for procedure for purposes other than remedying health state, unspecified: Secondary | ICD-10-CM

## 2015-03-19 DIAGNOSIS — K8010 Calculus of gallbladder with chronic cholecystitis without obstruction: Secondary | ICD-10-CM | POA: Diagnosis not present

## 2015-03-19 DIAGNOSIS — R1011 Right upper quadrant pain: Secondary | ICD-10-CM | POA: Diagnosis present

## 2015-03-19 DIAGNOSIS — Z79899 Other long term (current) drug therapy: Secondary | ICD-10-CM | POA: Insufficient documentation

## 2015-03-19 HISTORY — PX: CHOLECYSTECTOMY: SHX55

## 2015-03-19 SURGERY — LAPAROSCOPIC CHOLECYSTECTOMY WITH INTRAOPERATIVE CHOLANGIOGRAM
Anesthesia: General

## 2015-03-19 MED ORDER — MIDAZOLAM HCL 5 MG/5ML IJ SOLN
INTRAMUSCULAR | Status: DC | PRN
  Administered 2015-03-19: 2 mg via INTRAVENOUS

## 2015-03-19 MED ORDER — MORPHINE SULFATE (PF) 10 MG/ML IV SOLN: 1.0000 mg | INTRAVENOUS | Status: DC | PRN

## 2015-03-19 MED ORDER — ACETAMINOPHEN 325 MG PO TABS: 650.0000 mg | ORAL_TABLET | ORAL | Status: DC | PRN

## 2015-03-19 MED ORDER — FENTANYL CITRATE (PF) 250 MCG/5ML IJ SOLN
INTRAMUSCULAR | Status: AC
  Filled 2015-03-19: qty 5

## 2015-03-19 MED ORDER — LIDOCAINE HCL (CARDIAC) 20 MG/ML IV SOLN
INTRAVENOUS | Status: DC | PRN
  Administered 2015-03-19: 100 mg via INTRAVENOUS

## 2015-03-19 MED ORDER — 0.9 % SODIUM CHLORIDE (POUR BTL) OPTIME
TOPICAL | Status: DC | PRN
  Administered 2015-03-19: 1000 mL

## 2015-03-19 MED ORDER — OXYCODONE HCL 5 MG PO TABS: 5.0000 mg | ORAL_TABLET | ORAL | Status: DC | PRN

## 2015-03-19 MED ORDER — KETOROLAC TROMETHAMINE 30 MG/ML IJ SOLN
INTRAMUSCULAR | Status: AC
  Filled 2015-03-19: qty 1

## 2015-03-19 MED ORDER — FENTANYL CITRATE (PF) 100 MCG/2ML IJ SOLN
25.0000 ug | INTRAMUSCULAR | Status: DC | PRN
  Administered 2015-03-19: 50 ug via INTRAVENOUS

## 2015-03-19 MED ORDER — OXYCODONE HCL 5 MG PO TABS
5.0000 mg | ORAL_TABLET | ORAL | Status: DC | PRN
  Administered 2015-03-19: 5 mg via ORAL
  Filled 2015-03-19: qty 1

## 2015-03-19 MED ORDER — FENTANYL CITRATE (PF) 100 MCG/2ML IJ SOLN
INTRAMUSCULAR | Status: AC
  Filled 2015-03-19: qty 2

## 2015-03-19 MED ORDER — CHLORHEXIDINE GLUCONATE 4 % EX LIQD: 1.0000 "application " | Freq: Once | CUTANEOUS | Status: DC

## 2015-03-19 MED ORDER — HEPARIN SODIUM (PORCINE) 5000 UNIT/ML IJ SOLN
5000.0000 [IU] | Freq: Once | INTRAMUSCULAR | Status: AC
  Administered 2015-03-19: 5000 [IU] via SUBCUTANEOUS
  Filled 2015-03-19: qty 1

## 2015-03-19 MED ORDER — HEMOSTATIC AGENTS (NO CHARGE) OPTIME
TOPICAL | Status: DC | PRN
  Administered 2015-03-19: 1

## 2015-03-19 MED ORDER — SODIUM CHLORIDE 0.9 % IJ SOLN: 3.0000 mL | Freq: Two times a day (BID) | INTRAMUSCULAR | Status: DC

## 2015-03-19 MED ORDER — DEXAMETHASONE SODIUM PHOSPHATE 10 MG/ML IJ SOLN
INTRAMUSCULAR | Status: DC | PRN
  Administered 2015-03-19: 10 mg via INTRAVENOUS

## 2015-03-19 MED ORDER — ONDANSETRON HCL 4 MG/2ML IJ SOLN
INTRAMUSCULAR | Status: AC
  Filled 2015-03-19: qty 2

## 2015-03-19 MED ORDER — LIDOCAINE HCL (CARDIAC) 20 MG/ML IV SOLN
INTRAVENOUS | Status: AC
  Filled 2015-03-19: qty 5

## 2015-03-19 MED ORDER — SODIUM CHLORIDE 0.9 % IV SOLN: INTRAVENOUS | Status: DC

## 2015-03-19 MED ORDER — MEPERIDINE HCL 50 MG/ML IJ SOLN: 6.2500 mg | INTRAMUSCULAR | Status: DC | PRN

## 2015-03-19 MED ORDER — CEFOTETAN DISODIUM-DEXTROSE 2-2.08 GM-% IV SOLR
INTRAVENOUS | Status: AC
  Filled 2015-03-19: qty 50

## 2015-03-19 MED ORDER — ACETAMINOPHEN 10 MG/ML IV SOLN
INTRAVENOUS | Status: AC
  Filled 2015-03-19: qty 100

## 2015-03-19 MED ORDER — KETOROLAC TROMETHAMINE 30 MG/ML IJ SOLN
30.0000 mg | Freq: Once | INTRAMUSCULAR | Status: AC
  Administered 2015-03-19: 30 mg via INTRAVENOUS

## 2015-03-19 MED ORDER — SODIUM CHLORIDE 0.9 % IV SOLN: 250.0000 mL | INTRAVENOUS | Status: DC | PRN

## 2015-03-19 MED ORDER — SODIUM CHLORIDE 0.9 % IJ SOLN: 3.0000 mL | INTRAMUSCULAR | Status: DC | PRN

## 2015-03-19 MED ORDER — LACTATED RINGERS IV SOLN
INTRAVENOUS | Status: DC | PRN
  Administered 2015-03-19: 10:00:00 via INTRAVENOUS
  Administered 2015-03-19: 1000 mL

## 2015-03-19 MED ORDER — PROPOFOL 10 MG/ML IV BOLUS
INTRAVENOUS | Status: AC
  Filled 2015-03-19: qty 20

## 2015-03-19 MED ORDER — ROCURONIUM BROMIDE 100 MG/10ML IV SOLN
INTRAVENOUS | Status: DC | PRN
  Administered 2015-03-19: 10 mg via INTRAVENOUS
  Administered 2015-03-19: 50 mg via INTRAVENOUS

## 2015-03-19 MED ORDER — MIDAZOLAM HCL 2 MG/2ML IJ SOLN
INTRAMUSCULAR | Status: AC
  Filled 2015-03-19: qty 2

## 2015-03-19 MED ORDER — ACETAMINOPHEN 10 MG/ML IV SOLN
1000.0000 mg | Freq: Once | INTRAVENOUS | Status: AC
  Administered 2015-03-19: 1000 mg via INTRAVENOUS

## 2015-03-19 MED ORDER — GLYCOPYRROLATE 0.2 MG/ML IJ SOLN
INTRAMUSCULAR | Status: AC
  Filled 2015-03-19: qty 3

## 2015-03-19 MED ORDER — CEFOTETAN DISODIUM-DEXTROSE 2-2.08 GM-% IV SOLR
2.0000 g | INTRAVENOUS | Status: AC
  Administered 2015-03-19: 2 g via INTRAVENOUS

## 2015-03-19 MED ORDER — GLYCOPYRROLATE 0.2 MG/ML IJ SOLN
INTRAMUSCULAR | Status: DC | PRN
Start: 2015-03-19 — End: 2015-03-19
  Administered 2015-03-19: 0.6 mg via INTRAVENOUS

## 2015-03-19 MED ORDER — ACETAMINOPHEN 650 MG RE SUPP
650.0000 mg | RECTAL | Status: DC | PRN
  Filled 2015-03-19: qty 1

## 2015-03-19 MED ORDER — PROMETHAZINE HCL 25 MG/ML IJ SOLN: 6.2500 mg | INTRAMUSCULAR | Status: DC | PRN

## 2015-03-19 MED ORDER — FENTANYL CITRATE (PF) 100 MCG/2ML IJ SOLN
INTRAMUSCULAR | Status: DC | PRN
  Administered 2015-03-19 (×5): 50 ug via INTRAVENOUS

## 2015-03-19 MED ORDER — EPHEDRINE SULFATE 50 MG/ML IJ SOLN
INTRAMUSCULAR | Status: DC | PRN
  Administered 2015-03-19: 10 mg via INTRAVENOUS

## 2015-03-19 MED ORDER — PROPOFOL 10 MG/ML IV BOLUS
INTRAVENOUS | Status: DC | PRN
  Administered 2015-03-19: 200 mg via INTRAVENOUS

## 2015-03-19 MED ORDER — NEOSTIGMINE METHYLSULFATE 10 MG/10ML IV SOLN
INTRAVENOUS | Status: DC | PRN
  Administered 2015-03-19: 4 mg via INTRAVENOUS

## 2015-03-19 MED ORDER — BUPIVACAINE-EPINEPHRINE (PF) 0.25% -1:200000 IJ SOLN
INTRAMUSCULAR | Status: AC
  Filled 2015-03-19: qty 30

## 2015-03-19 MED ORDER — LACTATED RINGERS IR SOLN
Status: DC | PRN
  Administered 2015-03-19: 1000 mL

## 2015-03-19 MED ORDER — IOHEXOL 300 MG/ML  SOLN
INTRAMUSCULAR | Status: DC | PRN
  Administered 2015-03-19: 7 mL

## 2015-03-19 MED ORDER — BUPIVACAINE-EPINEPHRINE 0.25% -1:200000 IJ SOLN
INTRAMUSCULAR | Status: DC | PRN
  Administered 2015-03-19: 30 mL

## 2015-03-19 MED ORDER — ONDANSETRON HCL 4 MG/2ML IJ SOLN
INTRAMUSCULAR | Status: DC | PRN
  Administered 2015-03-19: 4 mg via INTRAVENOUS

## 2015-03-19 MED ORDER — ROCURONIUM BROMIDE 100 MG/10ML IV SOLN
INTRAVENOUS | Status: AC
  Filled 2015-03-19: qty 1

## 2015-03-19 MED ORDER — NEOSTIGMINE METHYLSULFATE 10 MG/10ML IV SOLN
INTRAVENOUS | Status: AC
  Filled 2015-03-19: qty 1

## 2015-03-19 SURGICAL SUPPLY — 45 items
ADH SKN CLS APL DERMABOND .7 (GAUZE/BANDAGES/DRESSINGS) ×1
APL SRG 38 LTWT LNG FL B (MISCELLANEOUS) ×1
APPLICATOR ARISTA FLEXITIP XL (MISCELLANEOUS) ×2 IMPLANT
APPLIER CLIP 5 13 M/L LIGAMAX5 (MISCELLANEOUS) ×2
APPLIER CLIP ROT 10 11.4 M/L (STAPLE)
APR CLP MED LRG 11.4X10 (STAPLE)
APR CLP MED LRG 5 ANG JAW (MISCELLANEOUS) ×1
BAG SPEC RTRVL 10 TROC 200 (ENDOMECHANICALS) ×1
CABLE HIGH FREQUENCY MONO STRZ (ELECTRODE) ×2 IMPLANT
CHLORAPREP W/TINT 26ML (MISCELLANEOUS) ×2 IMPLANT
CLIP APPLIE 5 13 M/L LIGAMAX5 (MISCELLANEOUS) ×1 IMPLANT
CLIP APPLIE ROT 10 11.4 M/L (STAPLE) IMPLANT
COVER MAYO STAND STRL (DRAPES) ×1 IMPLANT
DERMABOND ADVANCED (GAUZE/BANDAGES/DRESSINGS) ×1
DERMABOND ADVANCED .7 DNX12 (GAUZE/BANDAGES/DRESSINGS) ×1 IMPLANT
DEVICE TROCAR PUNCTURE CLOSURE (ENDOMECHANICALS) ×2 IMPLANT
DRAPE C-ARM 42X120 X-RAY (DRAPES) ×1 IMPLANT
DRAPE LAPAROSCOPIC ABDOMINAL (DRAPES) ×2 IMPLANT
ELECT PENCIL ROCKER SW 15FT (MISCELLANEOUS) ×2 IMPLANT
ELECT REM PT RETURN 9FT ADLT (ELECTROSURGICAL) ×2
ELECTRODE REM PT RTRN 9FT ADLT (ELECTROSURGICAL) ×1 IMPLANT
GLOVE BIOGEL M STRL SZ7.5 (GLOVE) ×2 IMPLANT
GLOVE BIOGEL PI IND STRL 7.0 (GLOVE) ×1 IMPLANT
GLOVE BIOGEL PI INDICATOR 7.0 (GLOVE) ×1
GOWN STRL REUS W/TWL XL LVL3 (GOWN DISPOSABLE) ×6 IMPLANT
HEMOSTAT ARISTA ABSORB 3G PWDR (MISCELLANEOUS) ×1 IMPLANT
HEMOSTAT SNOW SURGICEL 2X4 (HEMOSTASIS) ×2 IMPLANT
KIT BASIN OR (CUSTOM PROCEDURE TRAY) ×2 IMPLANT
L-HOOK LAP DISP 36CM (ELECTROSURGICAL) ×2
LHOOK LAP DISP 36CM (ELECTROSURGICAL) ×1 IMPLANT
MARKER SKIN DUAL TIP RULER LAB (MISCELLANEOUS) ×2 IMPLANT
NS IRRIG 1000ML POUR BTL (IV SOLUTION) ×2 IMPLANT
POUCH RETRIEVAL ECOSAC 10 (ENDOMECHANICALS) IMPLANT
POUCH RETRIEVAL ECOSAC 10MM (ENDOMECHANICALS) ×1
SCISSORS LAP 5X35 DISP (ENDOMECHANICALS) ×2 IMPLANT
SET IRRIG TUBING LAPAROSCOPIC (IRRIGATION / IRRIGATOR) ×2 IMPLANT
SLEEVE XCEL OPT CAN 5 100 (ENDOMECHANICALS) ×5 IMPLANT
SUT MNCRL AB 4-0 PS2 18 (SUTURE) ×2 IMPLANT
SUT VICRYL 0 UR6 27IN ABS (SUTURE) ×2 IMPLANT
TOWEL OR 17X26 10 PK STRL BLUE (TOWEL DISPOSABLE) ×2 IMPLANT
TOWEL OR NON WOVEN STRL DISP B (DISPOSABLE) ×2 IMPLANT
TRAY LAPAROSCOPIC (CUSTOM PROCEDURE TRAY) ×2 IMPLANT
TROCAR BLADELESS OPT 5 100 (ENDOMECHANICALS) ×2 IMPLANT
TROCAR XCEL BLUNT TIP 100MML (ENDOMECHANICALS) ×2 IMPLANT
TROCAR XCEL NON-BLD 11X100MML (ENDOMECHANICALS) IMPLANT

## 2015-03-19 NOTE — Anesthesia Postprocedure Evaluation (Signed)
Anesthesia Post Note  Patient: KURSTIN DIMARZO  Procedure(s) Performed: Procedure(s) (LRB): LAPAROSCOPIC CHOLECYSTECTOMY WITH INTRAOPERATIVE CHOLANGIOGRAM (N/A)  Patient location during evaluation: PACU Anesthesia Type: General Level of consciousness: awake and alert Pain management: pain level controlled Vital Signs Assessment: post-procedure vital signs reviewed and stable Respiratory status: spontaneous breathing, nonlabored ventilation, respiratory function stable and patient connected to nasal cannula oxygen Cardiovascular status: blood pressure returned to baseline and stable Postop Assessment: no signs of nausea or vomiting Anesthetic complications: no    Last Vitals:  Filed Vitals:   03/19/15 1220 03/19/15 1222  BP:    Pulse: 65 64  Temp:    Resp: 14 8    Last Pain:  Filed Vitals:   03/19/15 1222  PainSc: 4                  Rehanna Oloughlin

## 2015-03-19 NOTE — Discharge Instructions (Signed)
[facility] °LAPAROSCOPIC SURGERY: POST OP INSTRUCTIONS °Always review your discharge instruction sheet given to you by the facility where your surgery was performed. °IF YOU HAVE DISABILITY OR FAMILY LEAVE FORMS, YOU MUST BRING THEM TO THE OFFICE FOR PROCESSING.   °DO NOT GIVE THEM TO YOUR DOCTOR. ° °1. A prescription for pain medication may be given to you upon discharge.  Take your pain medication as prescribed, if needed.  If narcotic pain medicine is not needed, then you may take acetaminophen (Tylenol) or ibuprofen (Advil) as needed. °2. Take your usually prescribed medications unless otherwise directed. °3. If you need a refill on your pain medication, please contact your pharmacy.  They will contact our office to request authorization. Prescriptions will not be filled after 5pm or on week-ends. °4. You should follow a light diet the first few days after arrival home, such as soup and crackers, etc.  Be sure to include lots of fluids daily. °5. Most patients will experience some swelling and bruising in the area of the incisions.  Ice packs will help.  Swelling and bruising can take several days to resolve.  °6. It is common to experience some constipation if taking pain medication after surgery.  Increasing fluid intake and taking a stool softener (such as Colace) will usually help or prevent this problem from occurring.  A mild laxative (Milk of Magnesia or Miralax) should be taken according to package instructions if there are no bowel movements after 48 hours. °7.  If your surgeon used skin glue on the incision, you may shower in 24 hours.  The glue will flake off over the next 2-3 weeks.  Any sutures or staples will be removed at the office during your follow-up visit. °8. ACTIVITIES:  You may resume regular (light) daily activities beginning the next day--such as daily self-care, walking, climbing stairs--gradually increasing activities as tolerated.  You may have sexual  intercourse when it is comfortable.  Refrain from any heavy lifting or straining until approved by your doctor. °a. You may drive when you are no longer taking prescription pain medication, you can comfortably wear a seatbelt, and you can safely maneuver your car and apply brakes. °9. You should see your doctor in the office for a follow-up appointment approximately 2-3 weeks after your surgery.  Make sure that you call for this appointment within a day or two after you arrive home to insure a convenient appointment time. °10. OTHER INSTRUCTIONS:DO NOT LIFT, PUSH, OR PULL ANYTHING GREATER THAN 10 POUNDS FOR 2 WEEKS  °WHEN TO CALL YOUR DOCTOR: °1. Fever over 101.0 °2. Inability to urinate °3. Continued bleeding from incision. °4. Increased pain, redness, or drainage from the incision. °5. Increasing abdominal pain ° °The clinic staff is available to answer your questions during regular business hours.  Please don’t hesitate to call and ask to speak to one of the nurses for clinical concerns.  If you have a medical emergency, go to the nearest emergency room or call 911.  A surgeon from [facility] is always on call at the hospital. °[address], [address], [place], [place]  [address] ? [address] [address], [place], [place]   [address] °[phone] ? [phone] ? FAX [phone] °Web site: [email or web address] ° °

## 2015-03-19 NOTE — Anesthesia Procedure Notes (Signed)
Procedure Name: Intubation Date/Time: 03/19/2015 9:47 AM Performed by: Thornell Mule Pre-anesthesia Checklist: Patient identified, Emergency Drugs available, Suction available and Patient being monitored Patient Re-evaluated:Patient Re-evaluated prior to inductionOxygen Delivery Method: Circle System Utilized Preoxygenation: Pre-oxygenation with 100% oxygen Intubation Type: IV induction Ventilation: Mask ventilation without difficulty Laryngoscope Size: Miller and 3 Grade View: Grade I Tube type: Oral Tube size: 7.0 mm Number of attempts: 1 Airway Equipment and Method: Stylet and Oral airway Placement Confirmation: ETT inserted through vocal cords under direct vision,  positive ETCO2 and breath sounds checked- equal and bilateral Secured at: 20 cm Tube secured with: Tape Dental Injury: Teeth and Oropharynx as per pre-operative assessment

## 2015-03-19 NOTE — Transfer of Care (Signed)
Immediate Anesthesia Transfer of Care Note  Patient: Christine Ramirez  Procedure(s) Performed: Procedure(s): LAPAROSCOPIC CHOLECYSTECTOMY WITH INTRAOPERATIVE CHOLANGIOGRAM (N/A)  Patient Location: PACU  Anesthesia Type:General  Level of Consciousness: awake, alert  and oriented  Airway & Oxygen Therapy: Patient Spontanous Breathing and Patient connected to face mask oxygen  Post-op Assessment: Report given to RN and Post -op Vital signs reviewed and stable  Post vital signs: Reviewed and stable  Last Vitals:  Filed Vitals:   03/19/15 0752  BP: 131/73  Pulse: 81  Temp: 36.7 C  Resp: 18    Complications: No apparent anesthesia complications

## 2015-03-19 NOTE — Op Note (Signed)
Christine Ramirez [id number] Sep 15, 1965 03/19/2015  Laparoscopic Cholecystectomy with IOC Procedure Note  Indications: This patient presents with symptomatic gallbladder disease and will undergo laparoscopic cholecystectomy.  Pre-operative Diagnosis: symptomatic cholelithiasis  Post-operative Diagnosis: Same, fatty liver  Surgeon: Atilano Ina   Assistants: Luretha Murphy MD FACS  Anesthesia: General endotracheal anesthesia  Procedure Details  The patient was seen again in the Holding Room. The risks, benefits, complications, treatment options, and expected outcomes were discussed with the patient. The possibilities of reaction to medication, pulmonary aspiration, perforation of viscus, bleeding, recurrent infection, finding a normal gallbladder, the need for additional procedures, failure to diagnose a condition, the possible need to convert to an open procedure, and creating a complication requiring transfusion or operation were discussed with the patient. The likelihood of improving the patient's symptoms with return to their baseline status is good.  The patient and/or family concurred with the proposed plan, giving informed consent. The site of surgery properly noted. The patient was taken to Operating Room, identified as Christine Ramirez and the procedure verified as Laparoscopic Cholecystectomy with Intraoperative Cholangiogram. A Time Out was held and the above information confirmed. Antibiotic prophylaxis was administered.   Prior to the induction of general anesthesia, antibiotic prophylaxis was administered. General endotracheal anesthesia was then administered and tolerated well. After the induction, the abdomen was prepped with Chloraprep and draped in the sterile fashion. The patient was positioned in the supine position.  She had an old upper midline incision so therefore I decided to gain entry into the abdominal cavity using Optivew technique. Access to the abdomen was  achieved using a 5 mm 0 laparoscope thru a 5 mm trocar In the left upper Quadrant 2 fingerbreadths below the left subcostal margin using the Optiview technique. Pneumoperitoneum was smoothly established up to 15 mm of mercury. The laparoscope was advanced and the abdominal cavity was surveilled. There were omental adhesions in the upper midline.  sthetic agent was injected into the skin near the umbilicus and an incision made. We dissected down to the abdominal fascia with blunt dissection.  The fascia was incised vertically and we entered the peritoneal cavity bluntly.  Two 5-mm ports were placed in the right upper quadrant. All skin incisions were infiltrated with a local anesthetic agent before making the incision and placing the trocars.  The omentum was taken down bluntly and with endoshears. Then a 11 mm trocar was placed in the supraumbilical position in her old scar under direct visualization. She had a fatty liver.  We positioned the patient in reverse Trendelenburg, tilted slightly to the patient's left.  The gallbladder was identified, the fundus grasped and retracted cephalad. Adhesions were lysed bluntly and with the electrocautery where indicated, taking care not to injure any adjacent organs or viscus. The infundibulum was grasped and retracted laterally, exposing the peritoneum overlying the triangle of Calot. This was then divided and exposed in a blunt fashion. A critical view of the cystic duct and cystic artery was obtained.  The cystic duct was clearly identified and bluntly dissected circumferentially. The cystic duct was ligated with a clip distally.   An incision was made in the cystic duct and the [facility] cholangiogram catheter introduced. The catheter was secured using a clip. A cholangiogram was then obtained which showed good visualization of the distal and proximal biliary tree with no sign of filling defects or obstruction.  Contrast flowed easily into the duodenum. The catheter was  then removed.   The cystic duct was then ligated  with clips and divided. The cystic artery which had been identified & dissected free was ligated with clips and divided as well.   The gallbladder was dissected from the liver bed in retrograde fashion with the electrocautery. There was some spillage of bile from the gallbladder but no stones. The gallbladder was removed and placed in an Ecco sac.  The gallbladder and Ecco sac were then removed through the umbilical port site. The liver bed was irrigated and inspected. Hemostasis was achieved with the electrocautery. Copious irrigation was utilized and was repeatedly aspirated until clear. Arista was placed in the gallbladder fossa. The umbilical fascia was closed with 2 interrupted 0 vicryl sutures using the endoclose suture device. The LUQ was visualized and no evidence of injury to surrounding structures.     We again inspected the right upper quadrant for hemostasis.  The umbilical closure was inspected and there was no air leak and nothing trapped within the closure. Pneumoperitoneum was released as we removed the trocars.  4-0 Monocryl was used to close the skin.   Dermabond was applied. The patient was then extubated and brought to the recovery room in stable condition. Instrument, sponge, and needle counts were correct at closure and at the conclusion of the case.   Findings: Slightly intrahepatic gallbladder, omental adhesions in midline. +critical view; normal IOC; fatty liver  Estimated Blood Loss: Minimal         Drains: none         Specimens: Gallbladder           Complications: None; patient tolerated the procedure well.         Disposition: PACU - hemodynamically stable.         Condition: stable  Christine Ramirez. Christine Campanile, MD, FACS General, Bariatric, & Minimally Invasive Surgery [facility], [place]

## 2015-03-19 NOTE — H&P (View-Only) (Signed)
Johann CapersKimberly O. Ferd GlassingFarrell 03/01/2015 9:52 AM Location: [facility] Patient #: [id number] DOB: 1965-09-21 Married / Language: [place] / Race: White Female   History of Present Illness Minerva Areola(Alexsia Klindt M. Daishia Fetterly MD; 03/01/2015 10:24 AM) Patient words: GB.  The patient is a 50 year old female who presents with symptomatic choledocholithiasis. She is referred by Dr. Lawerance BachBurns to discuss upper abdominal pain. She states that she has had several months of epigastric and right upper quadrant pain radiating to her back. It was initially intermittent but now it is more frequent and almost constant. It is now affecting her ability to work. She states she just hasn't felt good. She denies any fevers or chills. She denies any nausea or vomiting. She denies any jaundice. She has been taking some narcotics that her primary care physician gave her. Interestingly she states the pain is worse with activity. She does report that eating can also increase the discomfort. She also feels bloated. She has had a prior upper midline incision. She states that she had exploratory surgery when she was 50 years old because she had scarlet fever and hepatitis. She states that she had infection around her gallbladder at that time but they did not remove her gallbladder. They treated it with antibiotics. She does have shortness of breath with activity. She states that she probably has sleep apnea but was unable to attend the sleep study due to inclement weather   Problem List/Past Medical Atilano Ina(Kedar Sedano M Kamani Magnussen, MD; 03/01/2015 10:24 AM) SYMPTOMATIC CHOLELITHIASIS (K80.20)  Other Problems Atilano Ina(Kathia Covington M Sha Burling, MD; 03/01/2015 10:24 AM) Anxiety Disorder Arthritis Back Pain Cholelithiasis Chronic Obstructive Lung Disease Depression Gastroesophageal Reflux Disease Hepatitis High blood pressure Hypercholesterolemia Other disease, cancer, significant illness Sleep Apnea  Past Surgical History (Ammie Eversole, LPN; 0/4/54091/06/2015  8:119:52 AM) Appendectomy Knee Surgery Right.  Diagnostic Studies History (Ammie Eversole, LPN; 9/1/47821/06/2015 9:569:52 AM) Colonoscopy never Mammogram within last year Pap Smear 1-5 years ago  Allergies (Ammie Eversole, LPN; 2/1/30861/06/2015 5:789:53 AM) No Known Drug Allergies01/06/2015  Medication History (Ammie Eversole, LPN; 4/6/96291/06/2015 5:289:53 AM) Norco (5-325MG  Tablet, Oral) Active. Lisinopril (5MG  Tablet, Oral) Active. Zoloft (50MG  Tablet, Oral) Active. Medications Reconciled  Social History (Ammie Eversole, LPN; 4/1/32441/06/2015 0:109:52 AM) Alcohol use Occasional alcohol use. Caffeine use Carbonated beverages, Tea. No drug use Tobacco use Never smoker.  Family History (Ammie Eversole, LPN; 2/7/25361/06/2015 6:449:52 AM) Alcohol Abuse Family Members In General. Arthritis Father, Mother. Bleeding disorder Family Members In General. Breast Cancer Family Members In General, Mother. Cancer Family Members In General. Melanoma Family Members In General, Mother.  Pregnancy / Birth History Deon Pilling(Ammie Eversole, LPN; 0/3/47421/06/2015 5:959:52 AM) Age at menarche 11 years. Gravida 1 Irregular periods Maternal age 51-35 Para 0    Review of Systems (Ammie Eversole LPN; 6/3/87561/06/2015 4:339:52 AM) General Present- Appetite Loss, Chills, Fatigue and Night Sweats. Not Present- Fever, Weight Gain and Weight Loss. Skin Present- Dryness. Not Present- Change in Wart/Mole, Hives, Jaundice, New Lesions, Non-Healing Wounds, Rash and Ulcer. HEENT Present- Wears glasses/contact lenses. Not Present- Earache, Hearing Loss, Hoarseness, Nose Bleed, Oral Ulcers, Ringing in the Ears, Seasonal Allergies, Sinus Pain, Sore Throat, Visual Disturbances and Yellow Eyes. Respiratory Present- Snoring and Wheezing. Not Present- Bloody sputum, Chronic Cough and Difficulty Breathing. Cardiovascular Present- Leg Cramps, Shortness of Breath and Swelling of Extremities. Not Present- Chest Pain, Difficulty Breathing Lying Down, Palpitations and Rapid Heart  Rate. Gastrointestinal Present- Abdominal Pain, Bloating, Excessive gas, Gets full quickly at meals, Indigestion and Nausea. Not Present- Bloody Stool, Change in Bowel Habits, Chronic  diarrhea, Constipation, Difficulty Swallowing, Hemorrhoids, Rectal Pain and Vomiting. Female Genitourinary Present- Urgency. Not Present- Frequency, Nocturia, Painful Urination and Pelvic Pain. Musculoskeletal Present- Back Pain, Joint Pain, Joint Stiffness, Muscle Pain, Muscle Weakness and Swelling of Extremities. Neurological Present- Headaches, Numbness, Tingling and Weakness. Not Present- Decreased Memory, Fainting, Seizures, Tremor and Trouble walking. Psychiatric Not Present- Anxiety, Bipolar, Change in Sleep Pattern, Depression, Fearful and Frequent crying. Endocrine Not Present- Cold Intolerance, Excessive Hunger, Hair Changes, Heat Intolerance, Hot flashes and New Diabetes. Hematology Not Present- Easy Bruising, Excessive bleeding, Gland problems, HIV and Persistent Infections.  Vitals (Ammie Eversole LPN; 03/01/1094 0:45 AM) 03/01/2015 9:52 AM Weight: 303.2 lb Height: 61in Body Surface Area: 2.25 m Body Mass Index: 57.29 kg/m  Temp.: 97.68F(Oral)  Pulse: 72 (Regular)  BP: 124/74 (Sitting, Left Arm, Standard)       Physical Exam Minerva Areola M. Jasiri Hanawalt MD; 03/01/2015 10:21 AM) General Mental Status-Alert. General Appearance-Consistent with stated age. Hydration-Well hydrated. Voice-Normal. Note: morbidly obese   Head and Neck Head-normocephalic, atraumatic with no lesions or palpable masses. Trachea-midline. Thyroid Gland Characteristics - normal size and consistency.  Eye Eyeball - Bilateral-Extraocular movements intact. Sclera/Conjunctiva - Bilateral-No scleral icterus.  Chest and Lung Exam Chest and lung exam reveals -quiet, even and easy respiratory effort with no use of accessory muscles and on auscultation, normal breath sounds, no adventitious sounds and  normal vocal resonance. Inspection Chest Wall - Normal. Back - normal.  Breast - Did not examine.  Cardiovascular Cardiovascular examination reveals -normal heart sounds, regular rate and rhythm with no murmurs and normal pedal pulses bilaterally.  Abdomen Inspection Inspection of the abdomen reveals - No Hernias. Skin - Scar - Note: upper midline scar. Palpation/Percussion Palpation and Percussion of the abdomen reveal - Soft, Non Tender, No Rebound tenderness, No Rigidity (guarding) and No hepatosplenomegaly. Auscultation Auscultation of the abdomen reveals - Bowel sounds normal.  Peripheral Vascular Upper Extremity Palpation - Pulses bilaterally normal.  Neurologic Neurologic evaluation reveals -alert and oriented x 3 with no impairment of recent or remote memory. Mental Status-Normal.  Neuropsychiatric The patient's mood and affect are described as -normal. Judgment and Insight-insight is appropriate concerning matters relevant to self.  Musculoskeletal Normal Exam - Left-Upper Extremity Strength Normal and Lower Extremity Strength Normal. Normal Exam - Right-Upper Extremity Strength Normal and Lower Extremity Strength Normal.  Lymphatic Head & Neck  General Head & Neck Lymphatics: Bilateral - Description - Normal. Axillary - Did not examine. Femoral & Inguinal - Did not examine.    Assessment & Plan Minerva Areola M. Iman Orourke MD; 03/01/2015 10:24 AM) SYMPTOMATIC CHOLELITHIASIS (K80.20) Impression: I believe the patient's symptoms are consistent with gallbladder disease.  We discussed gallbladder disease. The patient was given Agricultural engineer. We discussed non-operative and operative management. We discussed the signs & symptoms of acute cholecystitis  I discussed laparoscopic cholecystectomy with IOC in detail. The patient was given educational material as well as diagrams detailing the procedure. We discussed the risks and benefits of a laparoscopic  cholecystectomy including, but not limited to bleeding, infection, injury to surrounding structures such as the intestine or liver, bile leak, retained gallstones, need to convert to an open procedure, prolonged diarrhea, blood clots such as DVT, common bile duct injury, anesthesia risks, and possible need for additional procedures. We did discuss because of her prior upper midline incision that she is slightly higher risk for conversion to open as well as injury to surrounding structures. We discussed the typical post-operative recovery course. I explained that the likelihood of improvement  of their symptoms is good.  The patient has elected to proceed with surgery. I did encourage her to follow with her PCP about getting rescheduled for sleep study Current Plans You are being scheduled for surgery - Our schedulers will call you.  You should hear from our office's scheduling department within 5 working days about the location, date, and time of surgery. We try to make accommodations for patient's preferences in scheduling surgery, but sometimes the OR schedule or the surgeon's schedule prevents [place] from making those accommodations.  If you have not heard from our office [phone]) in 5 working days, call the office and ask for your surgeon's nurse.  If you have other questions about your diagnosis, plan, or surgery, call the office and ask for your surgeon's nurse.  Pt Education - Pamphlet Given - Laparoscopic Gallbladder Surgery: discussed with patient and provided information. Pt Education - [facility] Laparosopic Post Op HCI ([place])  [name]. Andrey Campanile, MD, FACS General, Bariatric, & Minimally Invasive Surgery [facility], [place]

## 2015-03-19 NOTE — Interval H&P Note (Signed)
History and Physical Interval Note:  03/19/2015 8:56 AM  Christine Ramirez  has presented today for surgery, with the diagnosis of SYMPTOMATIC CHOLELITHIASIS  The various methods of treatment have been discussed with the patient and family. After consideration of risks, benefits and other options for treatment, the patient has consented to  Procedure(s): LAPAROSCOPIC CHOLECYSTECTOMY WITH INTRAOPERATIVE CHOLANGIOGRAM (N/A) as a surgical intervention .  The patient's history has been reviewed, patient examined, no change in status, stable for surgery.  I have reviewed the patient's chart and labs.  Questions were answered to the patient's satisfaction.    [name]. Andrey Campanile, MD, FACS General, Bariatric, & Minimally Invasive Surgery [facility], [place]   [facility]

## 2015-03-20 ENCOUNTER — Encounter (HOSPITAL_COMMUNITY): Payer: Self-pay | Admitting: General Surgery

## 2015-03-22 ENCOUNTER — Encounter (HOSPITAL_COMMUNITY): Payer: Self-pay | Admitting: General Surgery

## 2015-05-28 ENCOUNTER — Other Ambulatory Visit (HOSPITAL_COMMUNITY): Payer: Self-pay | Admitting: General Surgery

## 2015-05-31 ENCOUNTER — Encounter: Payer: Self-pay | Admitting: Family

## 2015-05-31 ENCOUNTER — Ambulatory Visit (INDEPENDENT_AMBULATORY_CARE_PROVIDER_SITE_OTHER): Payer: Federal, State, Local not specified - PPO | Admitting: Family

## 2015-05-31 VITALS — BP 128/92 | HR 61 | Temp 98.5°F | Resp 18 | Ht 62.0 in | Wt 295.0 lb

## 2015-05-31 DIAGNOSIS — A084 Viral intestinal infection, unspecified: Secondary | ICD-10-CM | POA: Diagnosis not present

## 2015-05-31 NOTE — Progress Notes (Signed)
Subjective:    Patient ID: Christine Ramirez, female    DOB: March 26, 1965, 50 y.o.   MRN: [id number]  Chief Complaint  Patient presents with  . Emesis    has had vomiting and diarrhea since monday night     HPI:  Christine Ramirez is a 50 y.o. female who  has a past medical history of Depression; Anxiety; Anemia; COPD (chronic obstructive pulmonary disease) (HCC); GERD (gastroesophageal reflux disease); Hyperlipidemia; Hypertension; Shortness of breath dyspnea; Pneumonia; Scarlet fever; Hepatitis; Jaundice; and H/O typhoid fever. and presents today for an acute office visit.   This is a new problem. Associated symptom diarrhea started about 1 week ago that was adequately controlled with Immodium. About 4 days ago she started feeling "really sick" and started vomiting that same evening. Has not vomited in over 2 days and has had diarrhea that has improved starting this morning. Describes that her diarrhea has been green on occasion. Continues to have some abdominal discomfort which she relates to from vomiting. States that she has been "feverish".  Overall she feels much better today.   No Known Allergies   Current Outpatient Prescriptions on File Prior to Visit  Medication Sig Dispense Refill  . acetaminophen (TYLENOL) 500 MG tablet Take 500 mg by mouth every 6 (six) hours as needed.    Marland Kitchen lisinopril (PRINIVIL,ZESTRIL) 5 MG tablet Take 1 tablet (5 mg total) by mouth daily. (Patient taking differently: Take 5 mg by mouth at bedtime. ) 90 tablet 1  . sertraline (ZOLOFT) 50 MG tablet Take 1 tablet (50 mg total) by mouth daily. (Patient taking differently: Take 50 mg by mouth at bedtime. ) 90 tablet 3   No current facility-administered medications on file prior to visit.     Past Surgical History  Procedure Laterality Date  . Appendectomy    . Knee surgery Right   . Abdominal exploration surgery  1975    age 45 at [facility] procedure  . Dilation and curettage of uterus   2000  . Cholecystectomy N/A 03/19/2015    Procedure: LAPAROSCOPIC CHOLECYSTECTOMY WITH INTRAOPERATIVE CHOLANGIOGRAM;  Surgeon: Gaynelle Adu, MD;  Location: [facility];  Service: General;  Laterality: N/A;    Review of Systems  Constitutional: Positive for fatigue. Negative for fever (Feverish) and chills.  Respiratory: Negative for chest tightness and shortness of breath.   Cardiovascular: Negative for chest pain, palpitations and leg swelling.  Gastrointestinal: Positive for nausea and vomiting.  Neurological: Positive for dizziness and headaches.      Objective:    BP 128/92 mmHg  Pulse 61  Temp(Src) 98.5 F (36.9 C) (Oral)  Resp 18  Ht  (1.575 m)  Wt 295 lb (133.811 kg)  BMI 53.94 kg/m2  SpO2 96% Nursing note and vital signs reviewed.  Physical Exam  Constitutional: She is oriented to person, place, and time. She appears well-developed and well-nourished. No distress.  Cardiovascular: Normal rate, regular rhythm, normal heart sounds and intact distal pulses.   Pulmonary/Chest: Effort normal and breath sounds normal.  Abdominal: Soft. Bowel sounds are normal. She exhibits no distension and no mass. There is no tenderness. There is no rebound and no guarding.  Neurological: She is alert and oriented to person, place, and time.  Skin: Skin is warm and dry.  Psychiatric: She has a normal mood and affect. Her behavior is normal. Judgment and thought content normal.      Assessment & Plan:   Problem List Items Addressed This Visit  Digestive   Viral gastroenteritis - Primary    Symptoms and exam consistent with viral gastroenteritis. Continue nonalcoholic/non-caffeinated beverages and advance diet as tolerated. Follow-up if symptoms worsen or do not continue to improve.         I have discontinued Ms. Bergman's ibuprofen and oxyCODONE. I am also having her maintain her sertraline, lisinopril, and acetaminophen.   Follow-up: Return if symptoms worsen or fail to  improve.  Jeanine Luzalone, Oluwatosin Bracy, FNP

## 2015-05-31 NOTE — Patient Instructions (Signed)
Thank you for choosing [facility].  Summary/Instructions:  Continue to drink plenty of fluids and advance diet as tolerate.   If your symptoms worsen or fail to improve, please contact our office for further instruction, or in case of emergency go directly to the emergency room at the closest medical facility.   Viral Gastroenteritis Viral gastroenteritis is also known as stomach flu. This condition affects the stomach and intestinal tract. It can cause sudden diarrhea and vomiting. The illness typically lasts 3 to 8 days. Most people develop an immune response that eventually gets rid of the virus. While this natural response develops, the virus can make you quite ill. CAUSES  Many different viruses can cause gastroenteritis, such as rotavirus or noroviruses. You can catch one of these viruses by consuming contaminated food or water. You may also catch a virus by sharing utensils or other personal items with an infected person or by touching a contaminated surface. SYMPTOMS  The most common symptoms are diarrhea and vomiting. These problems can cause a severe loss of body fluids (dehydration) and a body salt (electrolyte) imbalance. Other symptoms may include:  Fever.  Headache.  Fatigue.  Abdominal pain. DIAGNOSIS  Your caregiver can usually diagnose viral gastroenteritis based on your symptoms and a physical exam. A stool sample may also be taken to test for the presence of viruses or other infections. TREATMENT  This illness typically goes away on its own. Treatments are aimed at rehydration. The most serious cases of viral gastroenteritis involve vomiting so severely that you are not able to keep fluids down. In these cases, fluids must be given through an intravenous line (IV). HOME CARE INSTRUCTIONS   Drink enough fluids to keep your urine clear or pale yellow. Drink small amounts of fluids frequently and increase the amounts as tolerated.  Ask your caregiver for specific  rehydration instructions.  Avoid:  Foods high in sugar.  Alcohol.  Carbonated drinks.  Tobacco.  Juice.  Caffeine drinks.  Extremely hot or cold fluids.  Fatty, greasy foods.  Too much intake of anything at one time.  Dairy products until 24 to 48 hours after diarrhea stops.  You may consume probiotics. Probiotics are active cultures of beneficial bacteria. They may lessen the amount and number of diarrheal stools in adults. Probiotics can be found in yogurt with active cultures and in supplements.  Wash your hands well to avoid spreading the virus.  Only take over-the-counter or prescription medicines for pain, discomfort, or fever as directed by your caregiver. Do not give aspirin to children. Antidiarrheal medicines are not recommended.  Ask your caregiver if you should continue to take your regular prescribed and over-the-counter medicines.  Keep all follow-up appointments as directed by your caregiver. SEEK IMMEDIATE MEDICAL CARE IF:   You are unable to keep fluids down.  You do not urinate at least once every 6 to 8 hours.  You develop shortness of breath.  You notice blood in your stool or vomit. This may look like coffee grounds.  You have abdominal pain that increases or is concentrated in one small area (localized).  You have persistent vomiting or diarrhea.  You have a fever.  The patient is a child younger than 3 months, and he or she has a fever.  The patient is a child older than 3 months, and he or she has a fever and persistent symptoms.  The patient is a child older than 3 months, and he or she has a fever and symptoms  suddenly get worse.  The patient is a baby, and he or she has no tears when crying. MAKE SURE YOU:   Understand these instructions.  Will watch your condition.  Will get help right away if you are not doing well or get worse.   This information is not intended to replace advice given to you by your health care provider.  Make sure you discuss any questions you have with your health care provider.   Document Released: 02/10/2005 Document Revised: 05/05/2011 Document Reviewed: 11/27/2010 Elsevier Interactive Patient Education [employer]2016 [employer] Inc.

## 2015-05-31 NOTE — Progress Notes (Signed)
Pre visit review using our clinic review tool, if applicable. No additional management support is needed unless otherwise documented below in the visit note. 

## 2015-05-31 NOTE — Assessment & Plan Note (Signed)
Symptoms and exam consistent with viral gastroenteritis. Continue nonalcoholic/non-caffeinated beverages and advance diet as tolerated. Follow-up if symptoms worsen or do not continue to improve.

## 2015-06-13 ENCOUNTER — Ambulatory Visit: Payer: Federal, State, Local not specified - PPO | Admitting: Dietician

## 2015-06-18 ENCOUNTER — Ambulatory Visit: Payer: Federal, State, Local not specified - PPO | Admitting: Internal Medicine

## 2015-06-18 DIAGNOSIS — Z0289 Encounter for other administrative examinations: Secondary | ICD-10-CM

## 2015-06-19 ENCOUNTER — Other Ambulatory Visit: Payer: Self-pay

## 2015-06-19 DIAGNOSIS — Z1231 Encounter for screening mammogram for malignant neoplasm of breast: Secondary | ICD-10-CM

## 2015-06-20 ENCOUNTER — Telehealth: Payer: Self-pay | Admitting: Internal Medicine

## 2015-06-20 NOTE — Telephone Encounter (Signed)
Patient no showed for 6 month fu on 4/24.  Please advise.  °

## 2015-06-21 NOTE — Telephone Encounter (Signed)
Fine

## 2015-06-22 ENCOUNTER — Encounter (HOSPITAL_COMMUNITY): Payer: Self-pay | Admitting: *Deleted

## 2015-06-22 ENCOUNTER — Inpatient Hospital Stay (HOSPITAL_COMMUNITY): Admit: 2015-06-22 | Payer: Federal, State, Local not specified - PPO

## 2015-06-22 ENCOUNTER — Encounter (HOSPITAL_COMMUNITY): Payer: Federal, State, Local not specified - PPO

## 2015-06-22 ENCOUNTER — Emergency Department (HOSPITAL_COMMUNITY)
Admission: EM | Admit: 2015-06-22 | Discharge: 2015-06-22 | Disposition: A | Discharge status: Home / Self Care | Payer: Federal, State, Local not specified - PPO | Attending: Emergency Medicine | Admitting: Emergency Medicine

## 2015-06-22 DIAGNOSIS — Z79899 Other long term (current) drug therapy: Secondary | ICD-10-CM | POA: Insufficient documentation

## 2015-06-22 DIAGNOSIS — M79604 Pain in right leg: Secondary | ICD-10-CM | POA: Diagnosis not present

## 2015-06-22 DIAGNOSIS — K219 Gastro-esophageal reflux disease without esophagitis: Secondary | ICD-10-CM | POA: Diagnosis not present

## 2015-06-22 DIAGNOSIS — E785 Hyperlipidemia, unspecified: Secondary | ICD-10-CM | POA: Insufficient documentation

## 2015-06-22 DIAGNOSIS — I10 Essential (primary) hypertension: Secondary | ICD-10-CM | POA: Diagnosis not present

## 2015-06-22 DIAGNOSIS — J449 Chronic obstructive pulmonary disease, unspecified: Secondary | ICD-10-CM | POA: Diagnosis not present

## 2015-06-22 DIAGNOSIS — F329 Major depressive disorder, single episode, unspecified: Secondary | ICD-10-CM | POA: Insufficient documentation

## 2015-06-22 LAB — BASIC METABOLIC PANEL
ANION GAP: 10 (ref 5–15)
BUN: 12 mg/dL (ref 6–20)
CO2: 26 mmol/L (ref 22–32)
Calcium: 9.1 mg/dL (ref 8.9–10.3)
Chloride: 107 mmol/L (ref 101–111)
Creatinine, Ser: 0.75 mg/dL (ref 0.44–1.00)
GFR calc Af Amer: >60 mL/min (ref >60)
Glucose, Bld: 127 mg/dL — ABNORMAL HIGH (ref 65–99)
POTASSIUM: 3.5 mmol/L (ref 3.5–5.1)
SODIUM: 143 mmol/L (ref 135–145)

## 2015-06-22 LAB — CBC WITH DIFFERENTIAL/PLATELET
BASOS ABS: 0 10*3/uL (ref 0.0–0.1)
BASOS PCT: 0 %
EOS PCT: 3 %
Eosinophils Absolute: 0.3 10*3/uL (ref 0.0–0.7)
HEMATOCRIT: 36.8 % (ref 36.0–46.0)
Hemoglobin: 11.6 g/dL — ABNORMAL LOW (ref 12.0–15.0)
LYMPHS PCT: 22 %
Lymphs Abs: 2 10*3/uL (ref 0.7–4.0)
MCH: 26.4 pg (ref 26.0–34.0)
MCHC: 31.5 g/dL (ref 30.0–36.0)
MCV: 83.8 fL (ref 78.0–100.0)
Monocytes Absolute: 0.6 10*3/uL (ref 0.1–1.0)
Monocytes Relative: 6 %
NEUTROS ABS: 6 10*3/uL (ref 1.7–7.7)
Neutrophils Relative %: 69 %
PLATELETS: 252 10*3/uL (ref 150–400)
RBC: 4.39 MIL/uL (ref 3.87–5.11)
RDW: 16.4 % — ABNORMAL HIGH (ref 11.5–15.5)
WBC: 8.8 10*3/uL (ref 4.0–10.5)

## 2015-06-22 LAB — I-STAT TROPONIN, ED: Troponin i, poc: 0 ng/mL (ref 0.00–0.08)

## 2015-06-22 LAB — D-DIMER, QUANTITATIVE: D-Dimer, Quant: <0.27 ug/mL-FEU (ref 0.00–0.50)

## 2015-06-22 NOTE — ED Notes (Addendum)
Pt states she is having pain behind her rt knee, Painful to walk, Concerned it may be a DVT, swelling noted bil feet

## 2015-06-22 NOTE — Discharge Instructions (Signed)

## 2015-06-22 NOTE — ED Provider Notes (Signed)
CSN: [id number]     Arrival date & time 06/22/15  1548 History   First MD Initiated Contact with Patient 06/22/15 1657     Chief Complaint  Patient presents with  . Leg Pain     The history is provided by the patient. No language interpreter was used.   Christine Ramirez is a 50 y.o. female who presents to the Emergency Department complaining of leg pain.  She reports 2 days of Pain into the right lower extremity. She has pain at the knee and shin. Pain is worse with ambulation. She has some cramping in her right calf. She has chronic shortness of breath that is slightly worse than baseline and a slight discomfort in her left chest. She had gallbladder surgery in January. Symptoms are mild to moderate, constant, worsening.Denies fevers, cough, abdominal pain, vomiting, diarrhea.  Past Medical History  Diagnosis Date  . Depression   . Anxiety   . Anemia   . COPD (chronic obstructive pulmonary disease) (HCC)   . GERD (gastroesophageal reflux disease)   . Hyperlipidemia   . Hypertension   . Shortness of breath dyspnea   . Pneumonia     in past  . Scarlet fever     age 89  . Hepatitis     age 48  . Jaundice     age 89  . H/O typhoid fever     age 82  . Fatty liver    Past Surgical History  Procedure Laterality Date  . Appendectomy    . Knee surgery Right   . Abdominal exploration surgery  1975    age 32 at [facility] procedure  . Dilation and curettage of uterus  2000  . Cholecystectomy N/A 03/19/2015    Procedure: LAPAROSCOPIC CHOLECYSTECTOMY WITH INTRAOPERATIVE CHOLANGIOGRAM;  Surgeon: Gaynelle Adu, MD;  Location: [facility];  Service: General;  Laterality: N/A;   Family History  Problem Relation Age of Onset  . Cancer Mother   . Melanoma Mother   . Arthritis Father   . Cancer Maternal Grandmother   . Heart disease Maternal Grandmother   . Cancer Maternal Grandfather   . Heart disease Maternal Grandfather   . Depression Paternal Grandmother   . Alcohol abuse  Paternal Grandfather    Social History  Substance Use Topics  . Smoking status: Never Smoker   . Smokeless tobacco: None  . Alcohol Use: 0.0 oz/week    0 Standard drinks or equivalent per week     Comment: rarely   OB History    No data available     Review of Systems  All other systems reviewed and are negative.     Allergies  Review of patient's allergies indicates no known allergies.  Home Medications   Prior to Admission medications   Medication Sig Start Date End Date Taking? Authorizing Provider  acetaminophen (TYLENOL) 500 MG tablet Take 1,000 mg by mouth every 6 (six) hours as needed for moderate pain.    Yes Historical Provider, MD  Cyanocobalamin (VITAMIN B 12 PO) Take 5,000 Units by mouth daily.   Yes Historical Provider, MD  lisinopril (PRINIVIL,ZESTRIL) 5 MG tablet Take 1 tablet (5 mg total) by mouth daily. Patient taking differently: Take 5 mg by mouth at bedtime.  02/21/15  Yes Myrlene Broker, MD  Probiotic Product (PROBIOTIC PO) Take 1 tablet by mouth daily.   Yes Historical Provider, MD  sertraline (ZOLOFT) 50 MG tablet Take 1 tablet (50 mg total) by mouth daily. Patient  taking differently: Take 50 mg by mouth at bedtime.  12/15/14  Yes Myrlene BrokerElizabeth A Crawford, MD   BP 114/67 mmHg  Pulse 73  Temp(Src) 98.2 F (36.8 C)  Resp 19  Ht 5\' 1"  (1.549 m)  Wt 300 lb (136.079 kg)  BMI 56.71 kg/m2  SpO2 98%  LMP 03/04/2015 Physical Exam  Constitutional: She is oriented to person, place, and time. She appears well-developed and well-nourished.  HENT:  Head: Normocephalic and atraumatic.  Cardiovascular: Normal rate and regular rhythm.   No murmur heard. Pulmonary/Chest: Effort normal and breath sounds normal. No respiratory distress.  Abdominal: Soft. There is no tenderness. There is no rebound and no guarding.  Musculoskeletal:  2+ DP pulses in bilateral lower extremities. No significant swelling to bilateral lower extremities. Mild right calf  tenderness.  Neurological: She is alert and oriented to person, place, and time.  Skin: Skin is warm and dry.  Psychiatric: She has a normal mood and affect. Her behavior is normal.  Nursing note and vitals reviewed.   ED Course  Procedures (including critical care time) Labs Review Labs Reviewed  BASIC METABOLIC PANEL - Abnormal; Notable for the following:    Glucose, Bld 127 (*)    All other components within normal limits  CBC WITH DIFFERENTIAL/PLATELET - Abnormal; Notable for the following:    Hemoglobin 11.6 (*)    RDW 16.4 (*)    All other components within normal limits  D-DIMER, QUANTITATIVE (NOT AT [facility])  I-STAT TROPOININ, ED    Imaging Review No results found. I have personally reviewed and evaluated these images and lab results as part of my medical decision-making.   EKG Interpretation   Date/Time:  Friday June 22 2015 17:36:50 EDT Ventricular Rate:  74 PR Interval:  156 QRS Duration: 91 QT Interval:  388 QTC Calculation: 430 R Axis:   45 Text Interpretation:  Sinus rhythm Low voltage, precordial leads Confirmed  by Lincoln Brighamees, Liz [phone]([id number]) on 06/22/2015 6:06:54 PM      MDM   Final diagnoses:  Right leg pain    Patient here for evaluation of leg pain and chronic shortness of breath. D-dimer is negative. She is low-risk for DVT and PE. No additional imaging oriented at this time. Returns of shortness of breath this is chronic in nature with no respiratory distress on examination. No evidence of pneumonia, CHF. Presentation is not consistent with ACS. Discussed home care for leg pain with anti-inflammatories, outpatient follow-up, return precautions.    Tilden FossaElizabeth Orry Sigl, MD 06/23/15 0020

## 2015-06-26 ENCOUNTER — Ambulatory Visit (HOSPITAL_COMMUNITY)
Admission: RE | Admit: 2015-06-26 | Discharge: 2015-06-26 | Disposition: A | Discharge status: Home / Self Care | Payer: Federal, State, Local not specified - PPO | Source: Ambulatory Visit | Attending: General Surgery | Admitting: General Surgery

## 2015-06-26 ENCOUNTER — Telehealth: Payer: Self-pay | Admitting: Internal Medicine

## 2015-06-26 ENCOUNTER — Ambulatory Visit (INDEPENDENT_AMBULATORY_CARE_PROVIDER_SITE_OTHER): Payer: Federal, State, Local not specified - PPO | Admitting: Cardiovascular Disease

## 2015-06-26 ENCOUNTER — Encounter: Payer: Self-pay | Admitting: Cardiovascular Disease

## 2015-06-26 VITALS — BP 130/80 | HR 75 | Ht 61.0 in | Wt 296.0 lb

## 2015-06-26 DIAGNOSIS — I10 Essential (primary) hypertension: Secondary | ICD-10-CM

## 2015-06-26 DIAGNOSIS — Z6841 Body Mass Index (BMI) 40.0 and over, adult: Secondary | ICD-10-CM | POA: Insufficient documentation

## 2015-06-26 DIAGNOSIS — Z01818 Encounter for other preprocedural examination: Secondary | ICD-10-CM | POA: Diagnosis not present

## 2015-06-26 NOTE — Telephone Encounter (Signed)
Called patient left vm to schedule an appt .  Placed paper on amy's desk

## 2015-06-26 NOTE — Patient Instructions (Signed)
Medication Instructions:  Your physician recommends that you continue on your current medications as directed. Please refer to the Current Medication list given to you today.   Labwork: None Ordered   Testing/Procedures: None Ordered   Follow-Up: Your physician recommends that you schedule a follow-up appointment in: as needed with Dr. Nahser   If you need a refill on your cardiac medications before your next appointment, please call your pharmacy.   Thank you for choosing [facility]! Artrice Kraker, RN [phone]    

## 2015-06-26 NOTE — Telephone Encounter (Signed)
Return Office visit is required, forwarding to [place] to schedule appointment  Thanks!

## 2015-06-26 NOTE — Telephone Encounter (Signed)
Patient walkedi n to drop off paperwork. She is requesting to get surgical clearance for bariatric surgery. Sending to dahlia for completion. Please call patient to advise.

## 2015-06-26 NOTE — Progress Notes (Signed)
Cardiology Office Note   Date:  06/26/2015   ID:  Christine Ramirez, DOB 11-07-1965, MRN [id number]  PCP:  Myrlene BrokerElizabeth A Crawford, MD  Cardiologist:   Elease HashimotoNahser, Deloris PingPhilip J, MD   Chief Complaint  Patient presents with  . New Evaluation    surgical clearance for weigh loss surgery  . Shortness of Breath   Problem List 1. Morbid obesity 2. Essential HTN 3. DOE  4.  . Hyperlipidemia 5. cholesctomy    History of Present Illness: Christine Ramirez is a 50 y.o. female who presents for pre - op visit prior to bariatric surgery .  No CP .  Went to the ER for leg pain on April 28.  Did not have a DVT Had some pain under her left breast -  Has not had any other chest pain   Able to climb up 1 flight of stairs - is limited by her knee pain .    Past Medical History  Diagnosis Date  . Depression   . Anxiety   . Anemia   . COPD (chronic obstructive pulmonary disease) (HCC)   . GERD (gastroesophageal reflux disease)   . Hyperlipidemia   . Hypertension   . Shortness of breath dyspnea   . Pneumonia     in past  . Scarlet fever     age 89  . Hepatitis     age 89  . Jaundice     age 89  . H/O typhoid fever     age 89  . Fatty liver     Past Surgical History  Procedure Laterality Date  . Appendectomy    . Knee surgery Right   . Abdominal exploration surgery  1975    age 68 [facility]  . Dilation and curettage of uterus  2000  . Cholecystectomy N/A 03/19/2015    Procedure: LAPAROSCOPIC CHOLECYSTECTOMY WITH INTRAOPERATIVE CHOLANGIOGRAM;  Surgeon: Gaynelle AduEric Wilson, MD;  Location: [facility];  Service: General;  Laterality: N/A;     Current Outpatient Prescriptions  Medication Sig Dispense Refill  . acetaminophen (TYLENOL) 500 MG tablet Take 1,000 mg by mouth every 6 (six) hours as needed for moderate pain.     . Cyanocobalamin (VITAMIN B 12 PO) Take 5,000 Units by mouth daily.    Marland Kitchen. lisinopril (PRINIVIL,ZESTRIL) 5 MG tablet Take 1 tablet (5 mg total) by mouth  daily. (Patient taking differently: Take 5 mg by mouth at bedtime. ) 90 tablet 1  . Probiotic Product (PROBIOTIC PO) Take 1 tablet by mouth daily.    . sertraline (ZOLOFT) 50 MG tablet Take 1 tablet (50 mg total) by mouth daily. (Patient taking differently: Take 50 mg by mouth at bedtime. ) 90 tablet 3   No current facility-administered medications for this visit.    Allergies:   Review of patient's allergies indicates no known allergies.    Social History:  The patient  reports that she has never smoked. She does not have any smokeless tobacco history on file. She reports that she drinks alcohol. She reports that she does not use illicit drugs.   Family History:  The patient's family history includes Alcohol abuse in her paternal grandfather; Arthritis in her father; Cancer in her maternal grandfather, maternal grandmother, and mother; Depression in her paternal grandmother; Heart disease in her maternal grandfather and maternal grandmother; Melanoma in her mother.    ROS:  Please see the history of present illness.    Review of Systems: Constitutional:  denies fever,  chills, diaphoresis, appetite change and fatigue.  She does have night sweats   HEENT: denies photophobia, eye pain, redness, hearing loss, ear pain, congestion, sore throat, rhinorrhea, sneezing, neck pain, neck stiffness and tinnitus.  Respiratory: admits to SOB, DOE,  Cardiovascular: denies chest pain, palpitations and leg swelling.  Gastrointestinal: denies nausea, vomiting, abdominal pain, diarrhea, constipation, blood in stool.  Genitourinary: denies dysuria, urgency, frequency, hematuria, flank pain and difficulty urinating.  Musculoskeletal: denies  myalgias, back pain, joint swelling, arthralgias and gait problem.   Skin: denies pallor, rash and wound.  Neurological: denies dizziness, seizures, syncope, weakness, light-headedness, numbness and headaches.   Hematological: denies adenopathy, easy bruising, personal  or family bleeding history.  Psychiatric/ Behavioral: denies suicidal ideation, mood changes, confusion, nervousness, sleep disturbance and agitation.       All other systems are reviewed and negative.    PHYSICAL EXAM: VS:  BP 130/80 mmHg  Pulse 75  Ht  (1.549 m)  Wt 296 lb (134.265 kg)  BMI 55.96 kg/m2  SpO2 97%  LMP 03/04/2015 , BMI Body mass index is 55.96 kg/(m^2). GEN: Well nourished, well developed, in no acute distress HEENT: normal Neck: no JVD, carotid bruits, or masses Cardiac: RRR; no murmurs, rubs, or gallops,no edema  Respiratory:  clear to auscultation bilaterally, normal work of breathing GI: soft, nontender, nondistended, + BS MS: no deformity or atrophy Skin: warm and dry, no rash Neuro:  Strength and sensation are intact Psych: normal   EKG:  EKG is not ordered today. The ekg ordered 06/22/15 :    demonstrates NSR at 74.  No ST or T wave changes.    Recent Labs: 12/15/2014: TSH 2.99 01/26/2015: ALT 17 06/22/2015: BUN 12; Creatinine, Ser 0.75; Hemoglobin 11.6*; Platelets 252; Potassium 3.5; Sodium 143    Lipid Panel    Component Value Date/Time   CHOL 235* 12/15/2014 1647   TRIG 158.0* 12/15/2014 1647   HDL 40.30 12/15/2014 1647   CHOLHDL 6 12/15/2014 1647   VLDL 31.6 12/15/2014 1647   LDLCALC 164* 12/15/2014 1647      Wt Readings from Last 3 Encounters:  06/26/15 296 lb (134.265 kg)  06/22/15 300 lb (136.079 kg)  05/31/15 295 lb (133.811 kg)      Other studies Reviewed: Additional studies/ records that were reviewed today include: . Review of the above records demonstrates:    ASSESSMENT AND PLAN:  Records from [place] [facility] were reviewed.   1.  Pre-op visit :   Has DOE which is likely due to her weight. I do not have any significant concerns that she has any cardiac disease. She is at low risk for her upcoming bariatric surgery . Will see her on an as needed basis   2. HTN:  Continue Lisinopril    Current  medicines are reviewed at length with the patient today.  The patient does not have concerns regarding medicines.  The following changes have been made:  no change  Labs/ tests ordered today include:  No orders of the defined types were placed in this encounter.     Disposition:   FU with me as needed      Nahser, Deloris Ping, MD  06/26/2015 10:05 AM    [facility] [address] [place], [place], [place]  [address] Phone: [phone]; Fax: [phone]

## 2015-07-13 ENCOUNTER — Ambulatory Visit (HOSPITAL_BASED_OUTPATIENT_CLINIC_OR_DEPARTMENT_OTHER): Payer: Federal, State, Local not specified - PPO | Attending: General Surgery | Admitting: Internal Medicine

## 2015-07-13 VITALS — Ht 61.0 in | Wt 300.0 lb

## 2015-07-13 DIAGNOSIS — R0683 Snoring: Secondary | ICD-10-CM

## 2015-07-13 DIAGNOSIS — G4733 Obstructive sleep apnea (adult) (pediatric): Secondary | ICD-10-CM | POA: Diagnosis not present

## 2015-07-18 ENCOUNTER — Encounter: Payer: Federal, State, Local not specified - PPO | Attending: General Surgery | Admitting: Skilled Nursing Facility1

## 2015-07-18 ENCOUNTER — Encounter: Payer: Self-pay | Admitting: Skilled Nursing Facility1

## 2015-07-18 DIAGNOSIS — Z6841 Body Mass Index (BMI) 40.0 and over, adult: Secondary | ICD-10-CM | POA: Diagnosis not present

## 2015-07-18 NOTE — Progress Notes (Signed)
  Pre-Op Assessment Visit:  Pre-Operative Sleeve Gastrectomy Surgery  Medical Nutrition Therapy:  Appt start time: 1500   End time:  1600.  Patient was seen on 07/18/2015 for Pre-Operative Nutrition Assessment. Assessment and letter of approval [facility] coordinator on 07/18/2015.  Pt states she Might wait until January 2018 to have the surgery. 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 Pt states she is not "big" on vegetables. Pt states she cut out regular soda 3 months ago.   Preferred Learning Style:   Auditory   Learning Readiness:   Ready  Handouts given during visit include:  Pre-Op Goals Bariatric Surgery Protein Shakes   During the appointment today the following Pre-Op Goals were reviewed with the patient: Maintain or lose weight as instructed by your surgeon Make healthy food choices Begin to limit portion sizes Limited concentrated sugars and fried foods Keep fat/sugar in the single digits per serving on   food labels Practice CHEWING your food  (aim for 30 chews per bite or until applesauce consistency) Practice not drinking 15 minutes before, during, and 30 minutes after each meal/snack Avoid all carbonated beverages  Avoid/limit caffeinated beverages  Avoid all sugar-sweetened beverages Consume 3 meals per day; eat every 3-5 hours Make a list of non-food related activities Aim for 64-100 ounces of FLUID daily  Aim for at least 60-80 grams of PROTEIN daily Look for a liquid protein source that contain ?15 g protein and ?5 g carbohydrate  (ex: shakes, drinks, shots)  Patient-Centered Goals: 10/10 specific/non-scale and confidence/importance scale 1-10  Demonstrated degree of understanding via:  Teach Back  Teaching Method Utilized:  Visual Auditory Hands on  Barriers to learning/adherence to lifestyle change:  None identified   Patient to call the [facility] to enroll in Pre-Op and Post-Op Nutrition Education when surgery date is scheduled.

## 2015-07-18 NOTE — Patient Instructions (Addendum)
Follow Pre-Op Goals Try Protein Shakes Call [facility] at [phone] when surgery is scheduled to enroll in Pre-Op Class  Things to remember:  Please always be honest with us. We want to support you!  If you have any questions or concerns in between appointments, please call or email Jennette BankerLiz, Leslie, or Jacki ConesLaurie.  The diet after surgery will be high protein and low in carbohydrate.  Vitamins and calcium need to be taken for the rest of your life.  Feel free to include support people in any classes or appointments.  -Try to set your alarm for every 90 minutes at work to get up from your desk   Supplement recommendations:  Complete" Multivitamin: Sleeve Gastrectomy and RYGB patients take a double dose of MVI. LAGB patients take single dose as it is written on the package. Vitamin must be liquid or chewable but not gummy. Examples of these include Flintstones Complete and Centrum Complete. If the vitamin is bariatric-specific, take 1 dose as it is already formulated for bariatric surgery patients. Examples of these are Bariatric Advantage, Celebrate, and Tyson FoodsWellesse. These can be found at the [facility] and/or online.     Calcium citrate: 1500 mg/day of Calcium citrate (also chewable or liquid) is recommended for all procedures. The body is only able to absorb 500-600 mg of Calcium at one time so 3 daily doses of 500 mg are recommended. Calcium doses must be taken a minimum of 2 hours apart. Additionally, Calcium must be taken 2 hours apart from iron-containing MVI. Examples of brands include Celebrate, Bariatric Advantage, and Wellesse. These brands must be purchased online or at the [facility]. Citracal Petites is the only Calcium citrate supplement found in general grocery stores and pharmacies. This is in tablet form and may be recommended for patients who do not tolerate chewable Calcium.  Continued or added Vitamin D supplementation based on individual needs.     Vitamin B12: 300-500 mcg/day for Sleeve Gastrectomy and RYGB. Optional for LAGB patients as stomach remains fully intact. Must be taken intramuscularly, sublingually, or inhaled nasally. Oral route is not recommended.

## 2015-07-19 DIAGNOSIS — F509 Eating disorder, unspecified: Secondary | ICD-10-CM | POA: Diagnosis not present

## 2015-07-25 ENCOUNTER — Other Ambulatory Visit (HOSPITAL_BASED_OUTPATIENT_CLINIC_OR_DEPARTMENT_OTHER): Payer: Self-pay

## 2015-07-25 DIAGNOSIS — R0683 Snoring: Secondary | ICD-10-CM

## 2015-07-25 DIAGNOSIS — G4733 Obstructive sleep apnea (adult) (pediatric): Secondary | ICD-10-CM

## 2015-07-28 NOTE — Procedures (Signed)
  Patient Name: Christine Ramirez, Christine Ramirez Study Date: 07/13/2015 Gender: Female D.O.B: 1965/10/17 Age (years): 49 Referring Provider: Gaynelle AduEric Wilson Height (inches): 61 Interpreting Physician: Jetty Duhamellinton Young MD, ABSM Weight (lbs): 300 RPSGT: Elaina Patteeeeriemer, Holly BMI: 57 MRN: [id number] Neck Size: 16.25 CLINICAL INFORMATION Sleep Study Type: NPSG Indication for sleep study: Obesity, Snoring Epworth Sleepiness Score: 9  SLEEP STUDY TECHNIQUE As per the AASM Manual for the Scoring of Sleep and Associated Events v2.3 (April 2016) with a hypopnea requiring 4% desaturations. The channels recorded and monitored were frontal, central and occipital EEG, electrooculogram (EOG), submentalis EMG (chin), nasal and oral airflow, thoracic and abdominal wall motion, anterior tibialis EMG, snore microphone, electrocardiogram, and pulse oximetry.  MEDICATIONS Patient's medications include: charted for review. Medications self-administered by patient during sleep study : sertraline, lisinopril  SLEEP ARCHITECTURE The study was initiated at 10:29:03 PM and ended at 4:45:10 AM. Sleep onset time was 2.7 minutes and the sleep efficiency was 83.3%. The total sleep time was 313.4 minutes. Stage REM latency was 288.5 minutes. The patient spent 13.72% of the night in stage N1 sleep, 70.17% in stage N2 sleep, 6.54% in stage N3 and 9.57% in REM. Alpha intrusion was absent. Supine sleep was 0.07%. Wake after sleep onset 60 minutes  RESPIRATORY PARAMETERS The overall apnea/hypopnea index (AHI) was 6.5 per hour. There were 3 total apneas, including 3 obstructive, 0 central and 0 mixed apneas. There were 31 hypopneas and 41 RERAs. The AHI during Stage REM sleep was 32.0 per hour. AHI while supine was 0.0 per hour. The mean oxygen saturation was 95.07%. The minimum SpO2 during sleep was 82.00%. Loud snoring was noted during this study.  CARDIAC DATA The 2 lead EKG demonstrated sinus rhythm. The mean heart rate was 75.14  beats per minute. Other EKG findings include: None.  LEG MOVEMENT DATA The total PLMS were 156 with a resulting PLMS index of 29.87. Associated arousal with leg movement index was 1.1 .  IMPRESSIONS - Mild obstructive sleep apnea occurred during this study (AHI = 6.5/h). - There were not enough early events to meet protocol requirements for split CPAP titration. - No significant central sleep apnea occurred during this study (CAI = 0.0/h). - Mild oxygen desaturation was noted during this study (Min O2 = 82.00%). - The patient snored with Loud snoring volume. - No cardiac abnormalities were noted during this study. - Moderate periodic limb movements of sleep occurred during the study. No significant associated arousals.  DIAGNOSIS - Obstructive Sleep Apnea (327.23 [G47.33 ICD-10])  RECOMMENDATIONS - Very mild obstructive sleep apnea. Return to discuss treatment options. - Avoid alcohol, sedatives and other CNS depressants that may worsen sleep apnea and disrupt normal sleep architecture. - Sleep hygiene should be reviewed to assess factors that may improve sleep quality. - Weight management and regular exercise should be initiated or continued if appropriate.  Waymon BudgeYOUNG,CLINTON D Diplomate, American Board of Sleep Medicine  ELECTRONICALLY SIGNED ON:  07/28/2015, 10:43 AM [facility] PH: [phone]   FX: [phone] ACCREDITED BY THE AMERICAN ACADEMY OF SLEEP MEDICINE

## 2015-07-31 ENCOUNTER — Ambulatory Visit: Payer: Federal, State, Local not specified - PPO

## 2015-08-28 ENCOUNTER — Other Ambulatory Visit: Payer: Self-pay | Admitting: Internal Medicine

## 2015-09-03 ENCOUNTER — Other Ambulatory Visit: Payer: Self-pay | Admitting: Internal Medicine

## 2015-09-03 ENCOUNTER — Ambulatory Visit
Admission: RE | Admit: 2015-09-03 | Discharge: 2015-09-03 | Disposition: A | Discharge status: Home / Self Care | Payer: Federal, State, Local not specified - PPO | Source: Ambulatory Visit

## 2015-09-03 DIAGNOSIS — Z1231 Encounter for screening mammogram for malignant neoplasm of breast: Secondary | ICD-10-CM

## 2015-09-05 DIAGNOSIS — F509 Eating disorder, unspecified: Secondary | ICD-10-CM | POA: Diagnosis not present

## 2015-09-10 ENCOUNTER — Encounter: Payer: Self-pay | Admitting: Internal Medicine

## 2015-09-10 ENCOUNTER — Ambulatory Visit (INDEPENDENT_AMBULATORY_CARE_PROVIDER_SITE_OTHER): Payer: Federal, State, Local not specified - PPO | Admitting: Internal Medicine

## 2015-09-10 VITALS — BP 110/70 | HR 72 | Temp 98.1°F | Resp 16 | Ht 61.0 in | Wt 298.1 lb

## 2015-09-10 DIAGNOSIS — M150 Primary generalized (osteo)arthritis: Secondary | ICD-10-CM | POA: Diagnosis not present

## 2015-09-10 DIAGNOSIS — I10 Essential (primary) hypertension: Secondary | ICD-10-CM | POA: Diagnosis not present

## 2015-09-10 DIAGNOSIS — M159 Polyosteoarthritis, unspecified: Secondary | ICD-10-CM

## 2015-09-10 DIAGNOSIS — M8949 Other hypertrophic osteoarthropathy, multiple sites: Secondary | ICD-10-CM

## 2015-09-10 NOTE — Progress Notes (Signed)
Pre visit review using our clinic review tool, if applicable. No additional management support is needed unless otherwise documented below in the visit note. 

## 2015-09-10 NOTE — Progress Notes (Signed)
   Subjective:    Patient ID: Christine Ramirez, female    DOB: March 13, 1965, 50 y.o.   MRN: [id number]  HPI Has tried dieting since 8120s (first at age 50), has had some limited success for a short time. She has complications with blood pressure and arthritis in her joints. She has tried [employer], low carb, [employer] watchers, more exercise. She is ready to do the surgery and wants to proceed.   Review of Systems  Constitutional: Positive for activity change, appetite change and unexpected weight change. Negative for fever, chills and fatigue.  HENT: Negative.   Eyes: Negative.   Respiratory: Positive for shortness of breath. Negative for cough, chest tightness and wheezing.   Cardiovascular: Negative for chest pain, palpitations and leg swelling.  Gastrointestinal: Negative for abdominal pain, diarrhea, constipation, blood in stool and abdominal distention.  Musculoskeletal: Positive for myalgias, back pain, arthralgias and gait problem.  Skin: Negative.   Neurological: Negative.   Psychiatric/Behavioral: Negative.       Objective:   Physical Exam  Constitutional: She is oriented to person, place, and time. She appears well-developed and well-nourished.  Overweight  HENT:  Head: Normocephalic and atraumatic.  Eyes: EOM are normal.  Neck: Normal range of motion.  Cardiovascular: Normal rate and regular rhythm.   Pulmonary/Chest: Effort normal. No respiratory distress. She has no wheezes. She has no rales.  Decreased sounds due to habitus  Abdominal: Soft. Bowel sounds are normal. She exhibits no distension. There is no tenderness. There is no rebound.  Musculoskeletal: She exhibits no edema.  Neurological: She is alert and oriented to person, place, and time. Coordination normal.  Skin: Skin is warm and dry.  Psychiatric: She has a normal mood and affect.   Filed Vitals:   09/10/15 1537  BP: 110/70  Pulse: 72  Temp: 98.1 F (36.7 C)  TempSrc: Oral  Resp: 16  Height: 5\' 1"   (1.549 m)  Weight: 298 lb 1.9 oz (135.226 kg)  SpO2: 98%      Assessment & Plan:

## 2015-09-10 NOTE — Patient Instructions (Signed)
We will get the letter done and sent to Dr. Tawana ScaleWilson's office within the next day.   Good luck with the weight loss!

## 2015-09-11 DIAGNOSIS — M1990 Unspecified osteoarthritis, unspecified site: Secondary | ICD-10-CM | POA: Insufficient documentation

## 2015-09-11 NOTE — Assessment & Plan Note (Signed)
BP at goal on her lisinopril. Continue to monitor.

## 2015-09-11 NOTE — Assessment & Plan Note (Signed)
Feel that this is medically necessary and will fill out papers for her for the surgery based on the information.

## 2015-09-11 NOTE — Assessment & Plan Note (Signed)
Taking OTC meds but if her weight is not improved she will need joint replacements in the future.

## 2015-09-13 ENCOUNTER — Telehealth: Payer: Self-pay | Admitting: Geriatric Medicine

## 2015-09-13 NOTE — Telephone Encounter (Signed)
Left message for patient to call back. I need to find out what she thinks her weight was in 2013 and 2014 in order to complete forms [facility].

## 2015-09-24 NOTE — Telephone Encounter (Signed)
Pt stated her weight 2013 was about 267 lb and 2014 was about 275 lb. Please give our office a call if this date and time is not good for you at [phone]. Please call pt back once its done.

## 2015-09-28 DIAGNOSIS — N938 Other specified abnormal uterine and vaginal bleeding: Secondary | ICD-10-CM | POA: Diagnosis not present

## 2015-10-02 NOTE — Telephone Encounter (Signed)
Completed form and [facility] to [facility] along with letter from Dr. Okey Duprerawford.

## 2015-10-16 ENCOUNTER — Ambulatory Visit: Payer: Federal, State, Local not specified - PPO | Admitting: Internal Medicine

## 2015-10-16 ENCOUNTER — Telehealth: Payer: Self-pay | Admitting: Internal Medicine

## 2015-10-16 DIAGNOSIS — N939 Abnormal uterine and vaginal bleeding, unspecified: Secondary | ICD-10-CM | POA: Diagnosis not present

## 2015-10-16 DIAGNOSIS — N938 Other specified abnormal uterine and vaginal bleeding: Secondary | ICD-10-CM | POA: Diagnosis not present

## 2015-10-16 NOTE — Telephone Encounter (Signed)
Patient called in to advise that she went to her obgyn for stomach pains. They have verified that it is nothing to do with obgyn related functions, and recommended that she be referred to gastro for possible colonoscopy. Last seen in July. Please advise.   She states that she had a biopsy at her obgyn, and took 1000 mg tylenol. She states that the pain on the left side is pretty severe.

## 2015-10-16 NOTE — Telephone Encounter (Signed)
Patient said she will call and schedule and appt to see Dr. Okey Duprerawford for the stomach pains.

## 2015-10-16 NOTE — Telephone Encounter (Signed)
I can refer to GI for colonoscopy but they are not going to be able to see her soon. Would recommend she can follow up with this office for the stomach pains since we have not seen her for this to get her some relief.

## 2015-11-09 DIAGNOSIS — M25562 Pain in left knee: Secondary | ICD-10-CM | POA: Diagnosis not present

## 2015-11-09 DIAGNOSIS — M25561 Pain in right knee: Secondary | ICD-10-CM | POA: Diagnosis not present

## 2015-11-27 ENCOUNTER — Other Ambulatory Visit: Payer: Self-pay | Admitting: Internal Medicine

## 2015-12-03 ENCOUNTER — Ambulatory Visit: Payer: Federal, State, Local not specified - PPO | Admitting: Skilled Nursing Facility1

## 2015-12-28 ENCOUNTER — Ambulatory Visit: Payer: Federal, State, Local not specified - PPO | Admitting: Skilled Nursing Facility1

## 2016-01-28 ENCOUNTER — Ambulatory Visit: Payer: Federal, State, Local not specified - PPO | Admitting: Skilled Nursing Facility1

## 2016-02-06 ENCOUNTER — Other Ambulatory Visit: Payer: Self-pay | Admitting: Internal Medicine

## 2016-02-28 ENCOUNTER — Encounter: Payer: Self-pay | Admitting: Internal Medicine

## 2016-02-28 ENCOUNTER — Ambulatory Visit (INDEPENDENT_AMBULATORY_CARE_PROVIDER_SITE_OTHER): Payer: Federal, State, Local not specified - PPO | Admitting: Internal Medicine

## 2016-02-28 VITALS — HR 88 | Temp 98.4°F | Resp 20 | Wt 296.0 lb

## 2016-02-28 DIAGNOSIS — K529 Noninfective gastroenteritis and colitis, unspecified: Secondary | ICD-10-CM | POA: Insufficient documentation

## 2016-02-28 DIAGNOSIS — A084 Viral intestinal infection, unspecified: Secondary | ICD-10-CM | POA: Diagnosis not present

## 2016-02-28 DIAGNOSIS — I10 Essential (primary) hypertension: Secondary | ICD-10-CM

## 2016-02-28 MED ORDER — CHOLESTYRAMINE LIGHT 4 GM/DOSE PO POWD: 1.0000 g | Freq: Two times a day (BID) | ORAL | 12 refills | Status: DC | PRN

## 2016-02-28 MED ORDER — DIPHENOXYLATE-ATROPINE 2.5-0.025 MG PO TABS: 1.0000 | ORAL_TABLET | Freq: Four times a day (QID) | ORAL | 0 refills | Status: DC | PRN

## 2016-02-28 MED ORDER — ONDANSETRON HCL 4 MG PO TABS: 4.0000 mg | ORAL_TABLET | Freq: Three times a day (TID) | ORAL | 1 refills | Status: DC | PRN

## 2016-02-28 NOTE — Assessment & Plan Note (Signed)
D/w pt, should still be ok for gastric sleeve later this year

## 2016-02-28 NOTE — Patient Instructions (Addendum)
Please take all new medication as prescribed - the zofran as needed for nausea, and the lomotil for dairrhea if needed  Please drink plenty of fluids in the next few days, and the urine color should lighten up  Please take all new medication as prescribed - the cholestryamine after the current illness is done, for the GB related diarrhea  Please continue all other medications as before, and refills have been done if requested.  Please have the pharmacy call with any other refills you may need.  Please keep your appointments with your specialists as you may have planned  You are given the work note  OK to continue plans for the gastric sleeve

## 2016-02-28 NOTE — Assessment & Plan Note (Signed)
Likely post CCX related, for bile salt sequestrant med,  to f/u any worsening symptoms or concerns

## 2016-02-28 NOTE — Assessment & Plan Note (Signed)
stable overall by history and exam, recent data reviewed with pt, and pt to continue medical treatment as before,  to f/u any worsening symptoms or concerns BP Readings from Last 3 Encounters:  09/10/15 110/70  06/26/15 130/80  06/22/15 114/67

## 2016-02-28 NOTE — Progress Notes (Signed)
Subjective:    Patient ID: Christine Ramirez, female    DOB: Sep 12, 1965, 51 y.o.   MRN: [id number]  HPI  Here with 4 days onset n/v/d with watery, non bloody diarrhea, crampy abd pains as well as soreness to anterior abd after vomiting so much, with low grade temp, but temp and vomiting much improved,  Still some nausea but able to get down whole bottles of pedialyte yesterday.  3 others in family with same illness.  Husband went back to work today, but she has been out all week, has no sick time but just cannot manage today due to weakness and still feeling poorly.  Food still seems to "go right through".  Pt denies chest pain, increased sob or doe, wheezing, orthopnea, PND, increased LE swelling, palpitations, dizziness or syncope.  Pt denies new neurological symptoms such as new headache, or facial or extremity weakness or numbness   Pt denies polydipsia, polyuria, though urine was darker colored this am.  Also incidentally has post CCX related recurring diarrhea, and has even had urgency and accidents at work related to this.  Asks for tx.  Does also have upcoming gastric sleeve surgury later this year, concerned this may make her ineligible.  No other new history Past Medical History:  Diagnosis Date  . Anemia   . Anxiety   . COPD (chronic obstructive pulmonary disease) (HCC)   . Depression   . Fatty liver   . GERD (gastroesophageal reflux disease)   . H/O typhoid fever    age 31  . Hepatitis    age 47  . Hyperlipidemia   . Hypertension   . Jaundice    age 73  . Pneumonia    in past  . Scarlet fever    age 50  . Shortness of breath dyspnea    Past Surgical History:  Procedure Laterality Date  . ABDOMINAL EXPLORATION SURGERY  1975   age 68 at [facility] procedure  . APPENDECTOMY    . CHOLECYSTECTOMY N/A 03/19/2015   Procedure: LAPAROSCOPIC CHOLECYSTECTOMY WITH INTRAOPERATIVE CHOLANGIOGRAM;  Surgeon: Gaynelle Adu, MD;  Location: [facility];  Service: General;  Laterality:  N/A;  . CHOLECYSTECTOMY    . DILATION AND CURETTAGE OF UTERUS  2000  . KNEE SURGERY Right     reports that she has never smoked. She does not have any smokeless tobacco history on file. She reports that she drinks alcohol. She reports that she does not use drugs. family history includes Alcohol abuse in her paternal grandfather; Arthritis in her father; Cancer in her maternal grandfather, maternal grandmother, and mother; Depression in her paternal grandmother; Heart disease in her maternal grandfather and maternal grandmother; Melanoma in her mother. No Known Allergies Current Outpatient Prescriptions on File Prior to Visit  Medication Sig Dispense Refill  . acetaminophen (TYLENOL) 500 MG tablet Take 1,000 mg by mouth every 6 (six) hours as needed for moderate pain.     . Cyanocobalamin (VITAMIN B 12 PO) Take 5,000 Units by mouth daily. Reported on 09/10/2015    . lisinopril (PRINIVIL,ZESTRIL) 5 MG tablet TAKE 1 TABLET BY MOUTH DAILY 90 tablet 1  . Probiotic Product (PROBIOTIC PO) Take 1 tablet by mouth daily. Reported on 09/10/2015    . sertraline (ZOLOFT) 50 MG tablet TAKE 1 TABLET BY MOUTH DAILY 90 tablet 3   No current facility-administered medications on file prior to visit.    Review of Systems  Constitutional: Negative for unusual diaphoresis or night sweats HENT: Negative for  ear swelling or discharge Eyes: Negative for worsening visual haziness  Respiratory: Negative for choking and stridor.   Gastrointestinal: Negative for distension or worsening eructation Genitourinary: Negative for retention or change in urine volume.  Musculoskeletal: Negative for other MSK pain or swelling Skin: Negative for color change and worsening wound Neurological: Negative for tremors and numbness other than noted  Psychiatric/Behavioral: Negative for decreased concentration or agitation other than above   All other system neg per pt    Objective:   Physical Exam Pulse 88   Temp 98.4 F (36.9  C) (Oral)   Resp 20   Wt 296 lb (134.3 kg)   LMP 03/04/2015 Comment: checked in OR before fluoro   SpO2 96%   BMI 55.93 kg/m  VS noted, fatigued, mild nauseus Constitutional: Pt appears in no apparent distress HENT: Head: NCAT.  Right Ear: External ear normal.  Left Ear: External ear normal.  Eyes: . Pupils are equal, round, and reactive to light. Conjunctivae and EOM are normal Bilat tm's with mild erythema.  Max sinus areas non tender.  Pharynx with mild erythema, no exudate Neck: Normal range of motion. Neck supple. No neck LA Cardiovascular: Normal rate and regular rhythm.   Pulmonary/Chest: Effort normal and breath sounds without rales or wheezing.  Abd:  Soft, ND, + BS with mild diffuse abd tender without guarding or rebound Neurological: Pt is alert. Not confused , motor grossly intact Skin: Skin is warm. No rash, no LE edema Psychiatric: Pt behavior is normal. No agitation.  No other new exam findings    Assessment & Plan:

## 2016-02-28 NOTE — Progress Notes (Signed)
Pre visit review using our clinic review tool, if applicable. No additional management support is needed unless otherwise documented below in the visit note. 

## 2016-02-28 NOTE — Progress Notes (Signed)
Letter done

## 2016-02-28 NOTE — Assessment & Plan Note (Signed)
High suspicion, somewhat improved today, but for zofran prn, lomotil prn, push fluids, tylenol, rest, and work note given

## 2016-04-30 ENCOUNTER — Other Ambulatory Visit: Payer: Self-pay | Admitting: General Surgery

## 2016-04-30 DIAGNOSIS — K760 Fatty (change of) liver, not elsewhere classified: Secondary | ICD-10-CM | POA: Diagnosis not present

## 2016-04-30 DIAGNOSIS — R7303 Prediabetes: Secondary | ICD-10-CM | POA: Diagnosis not present

## 2016-04-30 DIAGNOSIS — M170 Bilateral primary osteoarthritis of knee: Secondary | ICD-10-CM | POA: Diagnosis not present

## 2016-04-30 DIAGNOSIS — R5383 Other fatigue: Secondary | ICD-10-CM

## 2016-05-07 ENCOUNTER — Ambulatory Visit (HOSPITAL_COMMUNITY)
Admission: RE | Admit: 2016-05-07 | Discharge: 2016-05-07 | Disposition: A | Discharge status: Home / Self Care | Payer: Federal, State, Local not specified - PPO | Source: Ambulatory Visit | Attending: General Surgery | Admitting: General Surgery

## 2016-05-07 DIAGNOSIS — S0990XA Unspecified injury of head, initial encounter: Secondary | ICD-10-CM | POA: Diagnosis not present

## 2016-05-07 DIAGNOSIS — R5383 Other fatigue: Secondary | ICD-10-CM | POA: Diagnosis not present

## 2016-05-07 DIAGNOSIS — R51 Headache: Secondary | ICD-10-CM | POA: Diagnosis not present

## 2016-05-07 NOTE — Progress Notes (Signed)
OP routine adult EEG completed, results pending.

## 2016-05-07 NOTE — Procedures (Signed)
ELECTROENCEPHALOGRAM REPORT  Date of Study: 05/07/2016  Patient's Name: Collier FlowersKimberly O Kuiken MRN: [id number] Date of Birth: [birth year]  Referring Provider: Dr. Gaynelle AduEric Wilson  Clinical History: This is a 51 year old woman with head injury and headaches.  Medications: acetaminophen (TYLENOL) 500 MG tablet Take 1,000 mg by mouth every 6 (six) hours as needed for moderate pain.    Cyanocobalamin (VITAMIN B 12 PO) Take 5,000 Units by mouth daily. Reported on 09/10/2015   lisinopril (PRINIVIL,ZESTRIL) 5 MG tablet TAKE 1 TABLET BY MOUTH DAILY 90 tablet 1 Probiotic Product (PROBIOTIC PO) Take 1 tablet by mouth daily. Reported on 09/10/2015   sertraline (ZOLOFT) 50 MG tablet TAKE 1 TABLET BY MOUTH DAILY 90 tablet 3  Technical Summary: A multichannel digital EEG recording measured by the international 10-20 system with electrodes applied with paste and impedances below 5000 ohms performed in our laboratory with EKG monitoring in an awake and drowsy patient.  Hyperventilation and photic stimulation were performed.  The digital EEG was referentially recorded, reformatted, and digitally filtered in a variety of bipolar and referential montages for optimal display.    Description: The patient is awake and drowsy during the recording.  During maximal wakefulness, there is a symmetric, medium voltage 9 Hz posterior dominant rhythm that attenuates with eye opening.  The record is symmetric.  During drowsiness, there is an increase in theta slowing of the background.  Deeper stages of sleep were not seen.  Hyperventilation and photic stimulation did not elicit any abnormalities.  There were no epileptiform discharges or electrographic seizures seen.    EKG lead was unremarkable.  Impression: This awake and drowsy EEG is normal.    Clinical Correlation: A normal EEG does not exclude a clinical diagnosis of epilepsy. Clinical correlation is advised.   Patrcia DollyKaren Kasie Leccese, M.D.

## 2016-05-13 DIAGNOSIS — M940 Chondrocostal junction syndrome [Tietze]: Secondary | ICD-10-CM | POA: Diagnosis not present

## 2016-05-13 DIAGNOSIS — R05 Cough: Secondary | ICD-10-CM | POA: Diagnosis not present

## 2016-05-13 DIAGNOSIS — F430 Acute stress reaction: Secondary | ICD-10-CM | POA: Diagnosis not present

## 2016-05-16 ENCOUNTER — Ambulatory Visit (INDEPENDENT_AMBULATORY_CARE_PROVIDER_SITE_OTHER): Payer: Federal, State, Local not specified - PPO | Admitting: Adult Health

## 2016-05-16 VITALS — BP 128/84 | Temp 97.8°F | Ht 61.0 in | Wt 299.1 lb

## 2016-05-16 DIAGNOSIS — M545 Low back pain, unspecified: Secondary | ICD-10-CM

## 2016-05-16 MED ORDER — HYDROCODONE-HOMATROPINE 5-1.5 MG/5ML PO SYRP: 5.0000 mL | ORAL_SOLUTION | Freq: Three times a day (TID) | ORAL | 0 refills | Status: DC | PRN

## 2016-05-16 NOTE — Progress Notes (Signed)
Subjective:    Patient ID: Christine Ramirez, female    DOB: 1965/04/22, 51 y.o.   MRN: [id number]  HPI  51 year old female who  has a past medical history of Anemia; Anxiety; COPD (chronic obstructive pulmonary disease) (HCC); Depression; Fatty liver; GERD (gastroesophageal reflux disease); H/O typhoid fever; Hepatitis; Hyperlipidemia; Hypertension; Jaundice; Pneumonia; Scarlet fever; and Shortness of breath dyspnea.   She presents to the office today for the concern of pneumonia. She reports that she was seen at an [facility] [place] [facility] [facility] [place] and prescribed Doxycycline for pneumonia. Per patient " there x ray was down so they listened to my lungs and said that it sounded like pneumonia".   She feels as though her chest congestion has improved. Her biggest complaint today is that of continued back pain in the left lower back. She is not sleeping due to pain. She has had the pain for one week. She does feel like it is improving since starting the antibiotic   She continues to cough   She is using Tylenol one time a day and feels as though it helps a little bit.   Review of Systems See HPI   Past Medical History:  Diagnosis Date  . Anemia   . Anxiety   . COPD (chronic obstructive pulmonary disease) (HCC)   . Depression   . Fatty liver   . GERD (gastroesophageal reflux disease)   . H/O typhoid fever    age 89  . Hepatitis    age 89  . Hyperlipidemia   . Hypertension   . Jaundice    age 89  . Pneumonia    in past  . Scarlet fever    age 89  . Shortness of breath dyspnea     Social History   Social History  . Marital status: Married    Spouse name: N/A  . Number of children: N/A  . Years of education: N/A   Occupational History  . Not on file.   Social History Main Topics  . Smoking status: Never Smoker  . Smokeless tobacco: Not on file  . Alcohol use 0.0 oz/week     Comment: rarely  . Drug use: No  . Sexual activity: Not on file   Other Topics Concern  . Not on file    Social History Narrative  . No narrative on file    Past Surgical History:  Procedure Laterality Date  . ABDOMINAL EXPLORATION SURGERY  1975   age 89 [facility]  . APPENDECTOMY    . CHOLECYSTECTOMY N/A 03/19/2015   Procedure: LAPAROSCOPIC CHOLECYSTECTOMY WITH INTRAOPERATIVE CHOLANGIOGRAM;  Surgeon: Gaynelle AduEric Wilson, MD;  Location: [facility];  Service: General;  Laterality: N/A;  . CHOLECYSTECTOMY    . DILATION AND CURETTAGE OF UTERUS  2000  . KNEE SURGERY Right     Family History  Problem Relation Age of Onset  . Cancer Mother   . Melanoma Mother   . Arthritis Father   . Cancer Maternal Grandmother   . Heart disease Maternal Grandmother   . Cancer Maternal Grandfather   . Heart disease Maternal Grandfather   . Depression Paternal Grandmother   . Alcohol abuse Paternal Grandfather     No Known Allergies  Current Outpatient Prescriptions on File Prior to Visit  Medication Sig Dispense Refill  . acetaminophen (TYLENOL) 500 MG tablet Take 1,000 mg by mouth every 6 (six) hours as needed for moderate pain.     . cholestyramine light (PREVALITE) 4  GM/DOSE powder Take 0.5 packets (2 g total) by mouth 2 (two) times daily as needed. 239.4 g 12  . Cyanocobalamin (VITAMIN B 12 PO) Take 5,000 Units by mouth daily. Reported on 09/10/2015    . diphenoxylate-atropine (LOMOTIL) 2.5-0.025 MG tablet Take 1 tablet by mouth 4 (four) times daily as needed for diarrhea or loose stools. 40 tablet 0  . lisinopril (PRINIVIL,ZESTRIL) 5 MG tablet TAKE 1 TABLET BY MOUTH DAILY 90 tablet 1  . ondansetron (ZOFRAN) 4 MG tablet Take 1 tablet (4 mg total) by mouth every 8 (eight) hours as needed for nausea or vomiting. 40 tablet 1  . Probiotic Product (PROBIOTIC PO) Take 1 tablet by mouth daily. Reported on 09/10/2015    . sertraline (ZOLOFT) 50 MG tablet TAKE 1 TABLET BY MOUTH DAILY 90 tablet 3   No current facility-administered medications on file prior to visit.     BP 128/84   Temp  97.8 F (36.6 C) (Oral)   Ht 5\' 1"  (1.549 m)   Wt 299 lb 1.6 oz (135.7 kg)   LMP 03/04/2015   BMI 56.51 kg/m        Objective:   Physical Exam  Constitutional: She is oriented to person, place, and time. She appears well-developed and well-nourished. No distress.  Cardiovascular: Normal rate, regular rhythm, normal heart sounds and intact distal pulses.  Exam reveals no gallop.   No murmur heard. Pulmonary/Chest: Effort normal and breath sounds normal. No respiratory distress. She has no wheezes. She has no rales. She exhibits no tenderness.  Lungs sounds clear. Slight decreased in left lower quadrant but moving air easily.   Musculoskeletal: Normal range of motion. She exhibits no edema or tenderness.  No pain with palpation to back   Neurological: She is alert and oriented to person, place, and time.  Skin: Skin is warm and dry. No rash noted. She is not diaphoretic. No erythema. No pallor.  Psychiatric: She has a normal mood and affect. Her behavior is normal. Judgment and thought content normal.  Nursing note and vitals reviewed.     Assessment & Plan:  1. Acute left-sided low back pain without sciatica - Does not seem to be muscular but possible could be costochondritis? Doubt PE. Probable pain from pneumonia? Will give hycodan cough syrup and advised to take tylenol. Getting a chest x ray would not change how I would treat her for pneumonia  - HYDROcodone-homatropine (HYCODAN) 5-1.5 MG/5ML syrup; Take 5 mLs by mouth every 8 (eight) hours as needed for cough.  Dispense: 120 mL; Refill: 0 - Follow up with PCP if no improvement   Shirline Frees, NP

## 2016-05-19 ENCOUNTER — Encounter: Payer: Federal, State, Local not specified - PPO | Attending: General Surgery | Admitting: Skilled Nursing Facility1

## 2016-05-19 ENCOUNTER — Encounter: Payer: Self-pay | Admitting: Skilled Nursing Facility1

## 2016-05-19 DIAGNOSIS — Z713 Dietary counseling and surveillance: Secondary | ICD-10-CM | POA: Insufficient documentation

## 2016-05-19 DIAGNOSIS — E669 Obesity, unspecified: Secondary | ICD-10-CM

## 2016-05-19 DIAGNOSIS — Z6841 Body Mass Index (BMI) 40.0 and over, adult: Secondary | ICD-10-CM | POA: Diagnosis not present

## 2016-05-19 NOTE — Progress Notes (Signed)
Appt start time: 4:30 end time:  6:25 Assessment:   1st SWL Appointment.   Start Wt at [facility]: 295 pounds Wt: 294.4  BMI: 55.6  Pt is getting sleeve gastrectomy. Pt states that she likes comfort foods. She likes to eat carbs. Pt states that she just overcame pneumonia. Husband survived colon cancer and currently undergoing biopsies with lung specialist which has caused stress and very busy lifestyle with working and attending appts with husband. Pt discontinued previous weight loss surgery program due to not having enough leave-time at work. Pt has a temporary increase in Zoloft to 75 and says it will increase to 100 once current prescription is completed.  Pt states that she hasn't been eating much during the day. Crackers, nabs, ginger ale. Drinks Strawberry twist a lot. Doesn't like water. Recently been eating out a lot. Husband is supportive but eats larger portions. Pt states that she is pulling away from sugar. Pt states that she used to drink sodas throughout the day, but not anymore. Pt states that she drinks Premier shakes at times. Pt states that since having gallbladder removed Jan 2017 she sometimes has to go to restroom suddenly due to diarrhea. Pt states that she has meds that she takes as needed for this.   [employer] 3 months of visits. Wants to do the surgery as soon as possible.   Preferred Learning Style:   Auditory  Visual  Hands on  Learning Readiness:   Contemplating  Ready  Change in progress  MEDICATIONS: Zoloft   DIETARY INTAKE:  24-hr recall:  B ( AM): scrambled/boiled eggs, 2 small pcs of bacon, 2 pcs of white bread Snk ( AM): none L ( PM): skipped or sandwich  Snk ( PM): none D ( PM): chicken breast, meatloaf, canned green beans, chicken bouilon cubes, steamed asparagus,  Snk ( PM): none Beverages: Strawberry twist, unsweetened tea,   Usual physical activity: walking and recently joined the [employer] and does occasional water aerobics  Diet  to Follow: 2000 calories 225 g carbohydrates 150 g protein 56 g fat   Nutritional Diagnosis:  [place]-3.3 Overweight/obesity related to past poor dietary habits and physical inactivity as evidenced by patient w/ recent sleeve gastrectomy surgery following dietary guidelines for continued weight loss.    Intervention:  Nutrition counseling for upcoming Bariatric Surgery. Goals:  - Substitute water with unsweetened almond milk as an option for smoothies - Eating smaller meals throughout the day - Can use PB2 or Jiff peanut butter powder - Try Dannon Light and Fit Greek yogurt as snack option - Walk with your husband after work 3 times a week  Teaching Method Utilized:  Armed forces training and education officerVisual Auditory Hands on  Handouts given during visit include:  Protein shake options  Pre-op goals  Barriers to learning/adherence to lifestyle change: busy lifestyle, recently having to accompany husband to multiple doctors appts for biopsies  Demonstrated degree of understanding via:  Teach Back   Monitoring/Evaluation:  Dietary intake, exercise, and body weight prn.

## 2016-05-19 NOTE — Patient Instructions (Addendum)
-   Substitute water with unsweetened almond milk as an option for smoothies - Eating smaller meals throughout the day - Can use PB2 or Jiff peanut butter powder - Try Dannon Light and Fit Greek yogurt as snack option - Walk with your husband after work 3 times a week

## 2016-06-06 DIAGNOSIS — Z01818 Encounter for other preprocedural examination: Secondary | ICD-10-CM | POA: Diagnosis not present

## 2016-06-17 ENCOUNTER — Encounter: Payer: Federal, State, Local not specified - PPO | Attending: General Surgery | Admitting: Registered"

## 2016-06-17 ENCOUNTER — Encounter: Payer: Self-pay | Admitting: Registered"

## 2016-06-17 DIAGNOSIS — Z6841 Body Mass Index (BMI) 40.0 and over, adult: Secondary | ICD-10-CM | POA: Insufficient documentation

## 2016-06-17 DIAGNOSIS — Z713 Dietary counseling and surveillance: Secondary | ICD-10-CM | POA: Diagnosis not present

## 2016-06-17 NOTE — Progress Notes (Signed)
Appt start time: 4:45 end time: 5:15 Assessment: 2nd SWL Appointment   Start Wt at [facility]: 295 Wt: 292.0 BMI: 55.17  Pt arrives having lost 2.4 lbs from last visit. Pt states that she loves carbs and really hungry for dinner. Pt states that she rarely takes breaks during work and eats lunch at times. Pt states her husband does not have cancer nor lymphoma but still undergoing testing. Pt states she has been stressed during the last month with health concerns for husband. Pt reports she has cut out sugar from diet. Pt states she wants to be below 290 lbs by next visit.   Pt states she needs 1 more visit with [place] before surgery.   Preferred Learning Style:   No preference indicated   Learning Readiness:   Contemplating  Ready  Change in progress  MEDICATIONS: See medication list   DIETARY INTAKE:  24-hr recall:  B ( AM): apple cinnamon instant oatmeal  Snk ( AM): none  L ( PM): protein shake Snk ( PM): none D ( PM): chicken breast, pasta, asparagus Snk ( PM): none Beverages: protein shakes, Twist, unsweetened tea  Usual physical activity: walking  Diet to Follow: 2000 calories 225 g carbohydrates 150 g protein 56 g fat   Nutritional Diagnosis:  [place]-3.3 Overweight/obesity related to past poor dietary habits and physical inactivity as evidenced by patient w/ recent sleeve gastrectomy surgery following dietary guidelines for continued weight loss.    Intervention:  Nutrition counseling for upcoming Bariatric Surgery.  Goals:  - Add in snacks between 3 meals: greek yogurt, apples and peanut butter, nuts. - Physical activity 30 min/day 5 days/wk of walking and arm exercises.  Teaching Method Utilized:  Visual Auditory  Handouts given during visit include:  Arm chair exercises  Barriers to learning/adherence to lifestyle change: work-life balance  Demonstrated degree of understanding via:  Teach Back   Monitoring/Evaluation:  Dietary intake, exercise, and body  weight in 1 month(s).

## 2016-06-17 NOTE — Patient Instructions (Addendum)
-   Add in snacks between 3 meals: greek yogurt, apples and peanut butter, nuts.  - Physical activity 30 min/day 5 days/wk of walking and arm exercises.

## 2016-07-02 DIAGNOSIS — F509 Eating disorder, unspecified: Secondary | ICD-10-CM | POA: Diagnosis not present

## 2016-07-17 ENCOUNTER — Encounter: Payer: Federal, State, Local not specified - PPO | Attending: General Surgery | Admitting: Registered"

## 2016-07-17 ENCOUNTER — Encounter: Payer: Self-pay | Admitting: Registered"

## 2016-07-17 ENCOUNTER — Other Ambulatory Visit: Payer: Self-pay | Admitting: Gastroenterology

## 2016-07-17 DIAGNOSIS — Z713 Dietary counseling and surveillance: Secondary | ICD-10-CM | POA: Insufficient documentation

## 2016-07-17 DIAGNOSIS — E669 Obesity, unspecified: Secondary | ICD-10-CM

## 2016-07-17 DIAGNOSIS — Z6841 Body Mass Index (BMI) 40.0 and over, adult: Secondary | ICD-10-CM | POA: Insufficient documentation

## 2016-07-17 NOTE — Progress Notes (Signed)
Appt start time: 3:55 end time: 4:32 Assessment: 3rd (final) SWL Appointment   Start Wt at [facility]: 295 Wt: 287.5 BMI: 54.32   Pt arrives having lost 4.5 lbs since previous visit. Pt states she does not want to see 290 or 300 lbs again. Pt states she likes to decrease weight by 10 lb increments. Pt states she wants to be less than 280 lbs next time. Pt states she knows she is not getting enough fluids (54 oz) a day. Pt states she went to a new doctor who prescribed Prevalite to be added to drinks to lower cholesterol. Pt states she has not been taking it.  Pt reports she parks further away at work increasing her walking to 5620min/workday. Pt states she has not had any soda or carbonation since our last visit.    Preferred Learning Style:   No preference indicated   Learning Readiness:   Contemplating  Ready  Change in progress  MEDICATIONS: See medication list   DIETARY INTAKE:  24-hr recall:  B ( AM): apple cinnamon instant oatmeal or protein shake Snk ( AM): none  L ( PM): protein shake Snk ( PM): none D ( PM): grilled chicken breast, steamed asparagus (w/ butter and parmesan cheese) or broccoli, potatoes, rice Snk ( PM): none Beverages: protein shakes, Twist, decaf unsweetened tea  Usual physical activity: walking  Diet to Follow: 2000 calories 225 g carbohydrates 150 g protein 56 g fat   Nutritional Diagnosis:  [place]-3.3 Overweight/obesity related to past poor dietary habits and physical inactivity as evidenced by patient w/ upcoming sleeve gastrectomy surgery following dietary guidelines for continued weight loss.    Intervention:  Nutrition counseling for upcoming Bariatric Surgery.  Goals:  - Aim to get at least 64 oz of fluid a day. Use 8 oz or 16 oz bottles.  - Use MIO drops for water when going out to eat at restaurants. Keep drops in purse.  - Great job with achieving goals and making progress!  Teaching Method Utilized:  Visual Auditory  Handouts given  during visit include:  none  Barriers to learning/adherence to lifestyle change: work-life balance  Demonstrated degree of understanding via:  Teach Back   Monitoring/Evaluation:  Dietary intake, exercise, and body weight prn.

## 2016-07-17 NOTE — Patient Instructions (Addendum)
-   Aim to get at least 64 oz of fluid a day. Use 8 oz or 16 oz bottles.   - Use MIO drops for water when going out to eat at restaurants. Keep drops in purse.   - Great job with achieving goals and making progress!

## 2016-07-23 ENCOUNTER — Encounter (HOSPITAL_COMMUNITY): Payer: Self-pay | Admitting: *Deleted

## 2016-07-23 NOTE — Progress Notes (Signed)
Pt denies SOB, chest pain, and being under the care of a cardiologist.Pt denies having a stress test , echo and cardiac cath. Pt denies having an EKG and chest x ray within the last year. Pt denies having recent labs. Pt made aware to stop taking Aspirin, vitamins and herbal medications. Do not take any NSAIDs ie: Ibuprofen, Advil, Naproxen, BC and Goody Powder or any medication containing Aspirin. Pt verbalized understanding of all pre-op instructions.

## 2016-07-24 ENCOUNTER — Ambulatory Visit (HOSPITAL_COMMUNITY): Payer: Federal, State, Local not specified - PPO | Admitting: Anesthesiology

## 2016-07-24 ENCOUNTER — Encounter (HOSPITAL_COMMUNITY)
Admission: RE | Disposition: A | Discharge status: Home / Self Care | Payer: Self-pay | Source: Ambulatory Visit | Attending: Gastroenterology

## 2016-07-24 ENCOUNTER — Encounter (HOSPITAL_COMMUNITY): Payer: Self-pay | Admitting: *Deleted

## 2016-07-24 ENCOUNTER — Ambulatory Visit (HOSPITAL_COMMUNITY)
Admission: RE | Admit: 2016-07-24 | Discharge: 2016-07-24 | Disposition: A | Discharge status: Home / Self Care | Payer: Federal, State, Local not specified - PPO | Source: Ambulatory Visit | Attending: Gastroenterology | Admitting: Gastroenterology

## 2016-07-24 DIAGNOSIS — J449 Chronic obstructive pulmonary disease, unspecified: Secondary | ICD-10-CM | POA: Diagnosis not present

## 2016-07-24 DIAGNOSIS — F329 Major depressive disorder, single episode, unspecified: Secondary | ICD-10-CM | POA: Insufficient documentation

## 2016-07-24 DIAGNOSIS — F419 Anxiety disorder, unspecified: Secondary | ICD-10-CM | POA: Insufficient documentation

## 2016-07-24 DIAGNOSIS — Z8261 Family history of arthritis: Secondary | ICD-10-CM | POA: Insufficient documentation

## 2016-07-24 DIAGNOSIS — Z9889 Other specified postprocedural states: Secondary | ICD-10-CM | POA: Diagnosis not present

## 2016-07-24 DIAGNOSIS — E785 Hyperlipidemia, unspecified: Secondary | ICD-10-CM | POA: Insufficient documentation

## 2016-07-24 DIAGNOSIS — Z79899 Other long term (current) drug therapy: Secondary | ICD-10-CM | POA: Diagnosis not present

## 2016-07-24 DIAGNOSIS — K219 Gastro-esophageal reflux disease without esophagitis: Secondary | ICD-10-CM | POA: Diagnosis not present

## 2016-07-24 DIAGNOSIS — Z808 Family history of malignant neoplasm of other organs or systems: Secondary | ICD-10-CM | POA: Diagnosis not present

## 2016-07-24 DIAGNOSIS — Z9049 Acquired absence of other specified parts of digestive tract: Secondary | ICD-10-CM | POA: Diagnosis not present

## 2016-07-24 DIAGNOSIS — Z1211 Encounter for screening for malignant neoplasm of colon: Secondary | ICD-10-CM

## 2016-07-24 DIAGNOSIS — D649 Anemia, unspecified: Secondary | ICD-10-CM | POA: Diagnosis not present

## 2016-07-24 DIAGNOSIS — I10 Essential (primary) hypertension: Secondary | ICD-10-CM | POA: Insufficient documentation

## 2016-07-24 DIAGNOSIS — K640 First degree hemorrhoids: Secondary | ICD-10-CM | POA: Diagnosis not present

## 2016-07-24 DIAGNOSIS — M1990 Unspecified osteoarthritis, unspecified site: Secondary | ICD-10-CM | POA: Diagnosis not present

## 2016-07-24 DIAGNOSIS — Z8249 Family history of ischemic heart disease and other diseases of the circulatory system: Secondary | ICD-10-CM | POA: Insufficient documentation

## 2016-07-24 DIAGNOSIS — Z811 Family history of alcohol abuse and dependence: Secondary | ICD-10-CM | POA: Insufficient documentation

## 2016-07-24 HISTORY — DX: Headache, unspecified: R51.9

## 2016-07-24 HISTORY — DX: Other specified postprocedural states: Z98.890

## 2016-07-24 HISTORY — DX: Headache: R51

## 2016-07-24 HISTORY — PX: COLONOSCOPY WITH PROPOFOL: SHX5780

## 2016-07-24 HISTORY — DX: Other specified postprocedural states: R11.2

## 2016-07-24 HISTORY — DX: Cardiac murmur, unspecified: R01.1

## 2016-07-24 SURGERY — COLONOSCOPY WITH PROPOFOL
Anesthesia: Monitor Anesthesia Care

## 2016-07-24 MED ORDER — PHENYLEPHRINE HCL 10 MG/ML IJ SOLN
INTRAMUSCULAR | Status: DC | PRN
  Administered 2016-07-24: 80 ug via INTRAVENOUS

## 2016-07-24 MED ORDER — LACTATED RINGERS IV SOLN
INTRAVENOUS | Status: DC
  Administered 2016-07-24: 09:00:00 via INTRAVENOUS

## 2016-07-24 MED ORDER — MIDAZOLAM HCL 5 MG/5ML IJ SOLN
INTRAMUSCULAR | Status: DC | PRN
  Administered 2016-07-24: 2 mg via INTRAVENOUS

## 2016-07-24 MED ORDER — PROPOFOL 500 MG/50ML IV EMUL
INTRAVENOUS | Status: DC | PRN
  Administered 2016-07-24: 50 ug/kg/min via INTRAVENOUS

## 2016-07-24 MED ORDER — LIDOCAINE HCL (CARDIAC) 20 MG/ML IV SOLN
INTRAVENOUS | Status: DC | PRN
  Administered 2016-07-24: 40 mg via INTRATRACHEAL

## 2016-07-24 MED ORDER — FENTANYL CITRATE (PF) 100 MCG/2ML IJ SOLN: 25.0000 ug | INTRAMUSCULAR | Status: DC | PRN

## 2016-07-24 MED ORDER — FENTANYL CITRATE (PF) 100 MCG/2ML IJ SOLN
INTRAMUSCULAR | Status: DC | PRN
  Administered 2016-07-24: 50 ug via INTRAVENOUS

## 2016-07-24 SURGICAL SUPPLY — 22 items

## 2016-07-24 NOTE — Anesthesia Preprocedure Evaluation (Signed)
Anesthesia Evaluation  Patient identified by MRN, date of birth, ID band Patient awake    Reviewed: Allergy & Precautions, NPO status , reviewed documented beta blocker date and time   Airway Mallampati: II  TM Distance: >3 FB     Dental   Pulmonary shortness of breath, pneumonia, COPD,    breath sounds clear to auscultation       Cardiovascular hypertension, + Valvular Problems/Murmurs  Rhythm:Regular Rate:Normal     Neuro/Psych  Headaches,    GI/Hepatic GERD  ,(+) Hepatitis -  Endo/Other    Renal/GU negative Renal ROS     Musculoskeletal   Abdominal   Peds  Hematology   Anesthesia Other Findings   Reproductive/Obstetrics                             Anesthesia Physical Anesthesia Plan  ASA: III  Anesthesia Plan: MAC   Post-op Pain Management:    Induction: Intravenous  Airway Management Planned: Simple Face Mask  Additional Equipment:   Intra-op Plan:   Post-operative Plan:   Informed Consent: I have reviewed the patients History and Physical, chart, labs and discussed the procedure including the risks, benefits and alternatives for the proposed anesthesia with the patient or authorized representative who has indicated his/her understanding and acceptance.   Dental advisory given  Plan Discussed with: CRNA and Anesthesiologist  Anesthesia Plan Comments:         Anesthesia Quick Evaluation

## 2016-07-24 NOTE — Op Note (Signed)
[facility] Patient Name: Christine Ramirez Procedure Date : 07/24/2016 MRN: [id number] Attending MD: Lear Ng , MD Date of Birth: Jul 05, 1965 CSN: [id number] Age: 51 Admit Type: Outpatient Procedure:                Colonoscopy Indications:              Screening for colorectal malignant neoplasm, This                            is the patient's first colonoscopy Providers:                Lear Ng, MD, Burtis Junes, RN, Corliss Parish, Technician Referring MD:              Medicines:                Propofol per Anesthesia, Monitored Anesthesia Care Complications:            No immediate complications. Estimated Blood Loss:     Estimated blood loss: none. Procedure:                Pre-Anesthesia Assessment:                           - Prior to the procedure, a History and Physical                            was performed, and patient medications and                            allergies were reviewed. The patient's tolerance of                            previous anesthesia was also reviewed. The risks                            and benefits of the procedure and the sedation                            options and risks were discussed with the patient.                            All questions were answered, and informed consent                            was obtained. Prior Anticoagulants: The patient has                            taken no previous anticoagulant or antiplatelet                            agents. ASA Grade Assessment: III - A patient with  severe systemic disease. After reviewing the risks                            and benefits, the patient was deemed in                            satisfactory condition to undergo the procedure.                           After obtaining informed consent, the colonoscope                            was passed under direct vision. Throughout the                   procedure, the patient's blood pressure, pulse, and                            oxygen saturations were monitored continuously. The                            EC-[id number] ([id number]) scope was introduced through                            the anus and advanced to the the cecum, identified                            by appendiceal orifice and ileocecal valve. The                            colonoscopy was performed without difficulty. The                            patient tolerated the procedure well. The quality                            of the bowel preparation was fair and fair but                            repeated irrigation led to a good and adequate                            prep. The terminal ileum, the appendiceal orifice                            and the rectum were photographed. Scope In: 9:36:31 AM Scope Out: 4:54:09 AM Scope Withdrawal Time: 0 hours 5 minutes 8 seconds  Total Procedure Duration: 0 hours 10 minutes 23 seconds  Findings:      The perianal and digital rectal examinations were normal.      Internal hemorrhoids were found during retroflexion. The hemorrhoids       were small and Grade I (internal hemorrhoids that do not prolapse).      The exam was otherwise normal throughout the examined colon.      The terminal ileum appeared  normal. Impression:               - Preparation of the colon was fair.                           - Internal hemorrhoids.                           - The examined portion of the ileum was normal.                           - No specimens collected. Moderate Sedation:      N/A - MAC procedure Recommendation:           - Patient has a contact number available for                            emergencies. The signs and symptoms of potential                            delayed complications were discussed with the                            patient. Return to normal activities tomorrow.                            Written discharge  instructions were provided to the                            patient.                           - Resume previous diet.                           - Repeat colonoscopy in 10 years for screening                            purposes.                           - Continue present medications. Procedure Code(s):        --- Professional ---                           [id number], Colonoscopy, flexible; diagnostic, including                            collection of specimen(s) by brushing or washing,                            when performed (separate procedure) Diagnosis Code(s):        --- Professional ---                           Z12.11, Encounter for screening for malignant  neoplasm of colon                           K64.0, First degree hemorrhoids CPT copyright 2016 American Medical Association. All rights reserved. The codes documented in this report are preliminary and upon coder review may  be revised to meet current compliance requirements. Lear Ng, MD 07/24/2016 9:54:09 AM This report has been signed electronically. Number of Addenda: 0

## 2016-07-24 NOTE — H&P (Signed)
Primary Care Physician:  Myrlene Broker, MD Primary Gastroenterologist:  Dr. Bosie Clos  Reason for Visit: Screening  HPI: Christine Ramirez is a 51 y.o. female here today for a screening colonoscopy due to her age and workup of planned bariatric surgery. Has recurrent diarrhea since her GB surgery and uses Lomotil prn. Has occasional solid stools. Occasional blood with wiping. Has not taken a bile acid-sequestrant such as Cholestyramine since her GB surgery. Denies rectal bleeding.  Past Medical History:  Diagnosis Date  . Anemia   . Anxiety   . COPD (chronic obstructive pulmonary disease) (HCC)   . Depression   . Fatty liver   . GERD (gastroesophageal reflux disease)   . H/O typhoid fever    age 36  . Headache   . Heart murmur    as a child  . Hepatitis    age 31  . Hyperlipidemia   . Hypertension   . Jaundice    age 51  . Pneumonia    in past  . PONV (postoperative nausea and vomiting)   . Scarlet fever    age 80  . Shortness of breath dyspnea     Past Surgical History:  Procedure Laterality Date  . ABDOMINAL EXPLORATION SURGERY  1975   age 50 at [facility] procedure  . APPENDECTOMY    . CHOLECYSTECTOMY N/A 03/19/2015   Procedure: LAPAROSCOPIC CHOLECYSTECTOMY WITH INTRAOPERATIVE CHOLANGIOGRAM;  Surgeon: Gaynelle Adu, MD;  Location: [facility];  Service: General;  Laterality: N/A;  . CHOLECYSTECTOMY    . DILATION AND CURETTAGE OF UTERUS  2000  . KNEE SURGERY Right     Prior to Admission medications   Medication Sig Start Date End Date Taking? Authorizing Provider  acetaminophen (TYLENOL) 500 MG tablet Take 1,000 mg by mouth every 6 (six) hours as needed for moderate pain.    Yes [provider]  lisinopril (PRINIVIL,ZESTRIL) 5 MG tablet TAKE 1 TABLET BY MOUTH DAILY 02/06/16  Yes Myrlene Broker, MD  sertraline (ZOLOFT) 100 MG tablet Take 100 mg by mouth daily.   Yes [provider]  cholestyramine light (PREVALITE) 4 GM/DOSE  powder Take 0.5 packets (2 g total) by mouth 2 (two) times daily as needed. 02/28/16   Corwin Levins, MD  Cyanocobalamin (VITAMIN B 12 PO) Take 5,000 Units by mouth daily. Reported on 09/10/2015    [provider]  diphenoxylate-atropine (LOMOTIL) 2.5-0.025 MG tablet Take 1 tablet by mouth 4 (four) times daily as needed for diarrhea or loose stools. 02/28/16   Corwin Levins, MD  HYDROcodone-homatropine [facility]) 5-1.5 MG/5ML syrup Take 5 mLs by mouth every 8 (eight) hours as needed for cough. 05/16/16   Nafziger, Kandee Keen, NP  ondansetron (ZOFRAN) 4 MG tablet Take 1 tablet (4 mg total) by mouth every 8 (eight) hours as needed for nausea or vomiting. 02/28/16   Corwin Levins, MD  Probiotic Product (PROBIOTIC PO) Take 1 tablet by mouth daily. Reported on 09/10/2015    [provider]  sertraline (ZOLOFT) 50 MG tablet TAKE 1 TABLET BY MOUTH DAILY Patient not taking: Reported on 06/17/2016 11/27/15   Myrlene Broker, MD    Scheduled Meds: Continuous Infusions: . lactated ringers 10 mL/hr at 07/24/16 0844   PRN Meds:.fentaNYL (SUBLIMAZE) injection  Allergies as of 06/09/2016  . (No Known Allergies)    Family History  Problem Relation Age of Onset  . Cancer Mother   . Melanoma Mother   . Arthritis Father   . Cancer Maternal  Grandmother   . Heart disease Maternal Grandmother   . Cancer Maternal Grandfather   . Heart disease Maternal Grandfather   . Depression Paternal Grandmother   . Alcohol abuse Paternal Grandfather     Social History   Social History  . Marital status: Married    Spouse name: N/A  . Number of children: N/A  . Years of education: N/A   Occupational History  . Not on file.   Social History Main Topics  . Smoking status: Never Smoker  . Smokeless tobacco: Never Used  . Alcohol use 0.0 oz/week     Comment: rarely  . Drug use: No  . Sexual activity: Not on file   Other Topics Concern  . Not on file   Social History Narrative  . No narrative on  file    Review of Systems: All negative except as stated above in HPI.  Physical Exam: Vital signs: Vitals:   07/24/16 0812  BP: (!) 110/52  Pulse: 66  Resp: 18  Temp: 98.4 F (36.9 C)     General:   Alert,  Obese,pleasant and cooperative in NAD HEENT: anicteric sclera, oropharynx clear Neck: supple, nontender Lungs:  Clear throughout to auscultation.   No wheezes, crackles, or rhonchi. No acute distress. Heart:  Regular rate and rhythm; no murmurs, clicks, rubs,  or gallops. Abdomen: epigastric tenderness with guarding, soft, nondistended, +BS, obese, midline surgical scar  Rectal:  Deferred Ext: no edema  GI:  Lab Results: No results for input(s): WBC, HGB, HCT, PLT in the last 72 hours. BMET No results for input(s): NA, K, CL, CO2, GLUCOSE, BUN, CREATININE, CALCIUM in the last 72 hours. LFT No results for input(s): PROT, ALBUMIN, AST, ALT, ALKPHOS, BILITOT, BILIDIR, IBILI in the last 72 hours. PT/INR No results for input(s): LABPROT, INR in the last 72 hours.   Studies/Results: No results found.  Impression/Plan: 51 yo with intermittent diarrhea here for a screening colonoscopy. Informed consent obtained. Proceed with colonoscopy.    LOS: 0 days   Ahlayah Tarkowski C.  07/24/2016, 9:11 AM  Pager [phone]  AFTER 5 pm or on weekends please call [phone]

## 2016-07-24 NOTE — Transfer of Care (Signed)
Immediate Anesthesia Transfer of Care Note  Patient: LAURISA SAHAKIAN  Procedure(s) Performed: Procedure(s): COLONOSCOPY WITH PROPOFOL (N/A)  Patient Location: Endoscopy Unit  Anesthesia Type:MAC  Level of Consciousness: awake, alert  and oriented  Airway & Oxygen Therapy: Patient Spontanous Breathing and Patient connected to nasal cannula oxygen  Post-op Assessment: Report given to RN, Post -op Vital signs reviewed and stable and Patient moving all extremities X 4  Post vital signs: Reviewed and stable  Last Vitals:  Vitals:   07/24/16 0812 07/24/16 0953  BP: (!) 110/52 (!) 110/52  Pulse: 66   Resp: 18 16  Temp: 36.9 C 36.5 C    Last Pain:  Vitals:   07/24/16 0953  TempSrc: Oral         Complications: No apparent anesthesia complications

## 2016-07-24 NOTE — Interval H&P Note (Signed)
History and Physical Interval Note:  07/24/2016 9:23 AM  [name]  has presented today for surgery, with the diagnosis of SCREENING  The various methods of treatment have been discussed with the patient and family. After consideration of risks, benefits and other options for treatment, the patient has consented to  Procedure(s): COLONOSCOPY WITH PROPOFOL (N/A) as a surgical intervention .  The patient's history has been reviewed, patient examined, no change in status, stable for surgery.  I have reviewed the patient's chart and labs.  Questions were answered to the patient's satisfaction.     Julie Nay C.

## 2016-07-24 NOTE — Discharge Instructions (Addendum)
° °  Colonoscopy, Adult, Care After This sheet gives you information about how to care for yourself after your procedure. Your health care provider may also give you more specific instructions. If you have problems or questions, contact your health care provider. What can I expect after the procedure? After the procedure, it is common to have:  A small amount of blood in your stool for 24 hours after the procedure.  Some gas.  Mild abdominal cramping or bloating.  Follow these instructions at home: General instructions   For the first 24 hours after the procedure: ? Do not drive or use machinery. ? Do not sign important documents. ? Do not drink alcohol. ? Do your regular daily activities at a slower pace than normal. ? Eat soft, easy-to-digest foods. ? Rest often.  Take over-the-counter or prescription medicines only as told by your health care provider.  It is up to you to get the results of your procedure. Ask your health care provider, or the department performing the procedure, when your results will be ready. Relieving cramping and bloating  Try walking around when you have cramps or feel bloated.  Apply heat to your abdomen as told by your health care provider. Use a heat source that your health care provider recommends, such as a moist heat pack or a heating pad. ? Place a towel between your skin and the heat source. ? Leave the heat on for 20-30 minutes. ? Remove the heat if your skin turns bright red. This is especially important if you are unable to feel pain, heat, or cold. You may have a greater risk of getting burned. Eating and drinking  Drink enough fluid to keep your urine clear or pale yellow.  Resume your normal diet as instructed by your health care provider. Avoid heavy or fried foods that are hard to digest.  Avoid drinking alcohol for as long as instructed by your health care provider. Contact a health care provider if:  You have blood in your stool  2-3 days after the procedure. Get help right away if:  You have more than a small spotting of blood in your stool.  You pass large blood clots in your stool.  Your abdomen is swollen.  You have nausea or vomiting.  You have a fever.  You have increasing abdominal pain that is not relieved with medicine. This information is not intended to replace advice given to you by your health care provider. Make sure you discuss any questions you have with your health care provider. Document Released: 09/25/2003 Document Revised: 11/05/2015 Document Reviewed: 04/24/2015 Elsevier Interactive Patient Education  [employer]2018 Elsevier Inc.   Repeat colonoscopy in 10 years.

## 2016-07-24 NOTE — Anesthesia Postprocedure Evaluation (Signed)
Anesthesia Post Note  Patient: SHAWNEEQUA BALDRIDGE  Procedure(s) Performed: Procedure(s) (LRB): COLONOSCOPY WITH PROPOFOL (N/A)  Patient location during evaluation: PACU Anesthesia Type: MAC Level of consciousness: awake Pain management: pain level controlled Vital Signs Assessment: post-procedure vital signs reviewed and stable Respiratory status: spontaneous breathing Cardiovascular status: stable Anesthetic complications: no       Last Vitals:  Vitals:   07/24/16 0812 07/24/16 0953  BP: (!) 110/52 (!) 110/52  Pulse: 66   Resp: 18 16  Temp: 36.9 C 36.5 C    Last Pain:  Vitals:   07/24/16 0953  TempSrc: Oral                 Deontray Hunnicutt

## 2016-07-25 ENCOUNTER — Encounter (HOSPITAL_COMMUNITY): Payer: Self-pay | Admitting: Gastroenterology

## 2016-08-04 ENCOUNTER — Encounter: Payer: Federal, State, Local not specified - PPO | Attending: General Surgery | Admitting: Skilled Nursing Facility1

## 2016-08-04 DIAGNOSIS — E669 Obesity, unspecified: Secondary | ICD-10-CM

## 2016-08-04 DIAGNOSIS — Z6841 Body Mass Index (BMI) 40.0 and over, adult: Secondary | ICD-10-CM | POA: Diagnosis not present

## 2016-08-04 DIAGNOSIS — Z713 Dietary counseling and surveillance: Secondary | ICD-10-CM | POA: Insufficient documentation

## 2016-08-05 NOTE — Progress Notes (Signed)
  Pre-Operative Nutrition Class:  Appt start time: 9090   End time:  1830.  Patient was seen on 08/04/16 for Pre-Operative Bariatric Surgery Education at [facility].   Surgery date:  Surgery type: Sleeve Gastrectomy Start weight at [facility]: 295 lb Weight today: 287.6 lb  TANITA  BODY COMP RESULTS     BMI (kg/m^2)    Fat Mass (lbs)    Fat Free Mass (lbs)    Total Body Water (lbs)    Samples given per MNT protocol. Patient educated on appropriate usage: Bariatric Advantage Multivitamin Lot # [id number] Exp: 06/19  Celebrate Vitamins Multivitamin Lot # [id number] Exp: 06/19  Celebrate Vitamins Calcium Citrate Lot # [id number] Exp: 04/19  Renee Pain Protein Shake Lot # [id number] Exp: 12/28/16  The following the learning objectives were met by the patient during this course:  Identify Pre-Op Dietary Goals and will begin 2 weeks pre-operatively  Identify appropriate sources of fluids and proteins   State protein recommendations and appropriate sources pre and post-operatively  Identify Post-Operative Dietary Goals and will follow for 2 weeks post-operatively  Identify appropriate multivitamin and calcium sources  Describe the need for physical activity post-operatively and will follow MD recommendations  State when to call healthcare provider regarding medication questions or post-operative complications  Handouts given during class include:  Pre-Op Bariatric Surgery Diet Handout  Protein Shake Handout  Post-Op Bariatric Surgery Nutrition Handout  BELT Program Information Flyer  Support Group Information Flyer  [facility] Bariatric Supplements Price List  Follow-Up Plan: Patient will follow-up at [facility] 2 weeks post operatively for diet advancement per MD.

## 2016-08-11 ENCOUNTER — Other Ambulatory Visit: Payer: Self-pay | Admitting: Internal Medicine

## 2016-08-12 DIAGNOSIS — F509 Eating disorder, unspecified: Secondary | ICD-10-CM | POA: Diagnosis not present

## 2016-08-13 ENCOUNTER — Encounter: Payer: Self-pay | Admitting: Internal Medicine

## 2016-08-13 ENCOUNTER — Other Ambulatory Visit: Payer: Self-pay | Admitting: Internal Medicine

## 2016-08-14 MED ORDER — SERTRALINE HCL 100 MG PO TABS: 100.0000 mg | ORAL_TABLET | Freq: Every day | ORAL | 0 refills | Status: DC

## 2016-08-14 MED ORDER — DIPHENOXYLATE-ATROPINE 2.5-0.025 MG PO TABS: 1.0000 | ORAL_TABLET | Freq: Four times a day (QID) | ORAL | 0 refills | Status: DC | PRN

## 2016-08-14 NOTE — Telephone Encounter (Signed)
Done hardcopy to Shirron  

## 2016-08-14 NOTE — Telephone Encounter (Signed)
Faxed

## 2016-11-09 ENCOUNTER — Other Ambulatory Visit: Payer: Self-pay | Admitting: Internal Medicine

## 2017-01-04 DIAGNOSIS — S63502A Unspecified sprain of left wrist, initial encounter: Secondary | ICD-10-CM | POA: Diagnosis not present

## 2017-01-07 DIAGNOSIS — M25532 Pain in left wrist: Secondary | ICD-10-CM | POA: Diagnosis not present

## 2017-02-09 ENCOUNTER — Other Ambulatory Visit: Payer: Self-pay | Admitting: Internal Medicine

## 2017-03-10 ENCOUNTER — Other Ambulatory Visit: Payer: Self-pay | Admitting: Internal Medicine

## 2017-03-17 DIAGNOSIS — M5416 Radiculopathy, lumbar region: Secondary | ICD-10-CM | POA: Insufficient documentation

## 2017-04-29 DIAGNOSIS — M1711 Unilateral primary osteoarthritis, right knee: Secondary | ICD-10-CM | POA: Diagnosis not present

## 2017-04-29 DIAGNOSIS — M25561 Pain in right knee: Secondary | ICD-10-CM | POA: Diagnosis not present

## 2017-04-29 DIAGNOSIS — M1712 Unilateral primary osteoarthritis, left knee: Secondary | ICD-10-CM | POA: Diagnosis not present

## 2017-05-05 DIAGNOSIS — M5417 Radiculopathy, lumbosacral region: Secondary | ICD-10-CM | POA: Diagnosis not present

## 2017-05-05 DIAGNOSIS — M545 Low back pain: Secondary | ICD-10-CM | POA: Diagnosis not present

## 2017-05-09 DIAGNOSIS — M5416 Radiculopathy, lumbar region: Secondary | ICD-10-CM | POA: Diagnosis not present

## 2017-05-14 DIAGNOSIS — M5416 Radiculopathy, lumbar region: Secondary | ICD-10-CM | POA: Diagnosis not present

## 2017-05-21 DIAGNOSIS — M545 Low back pain: Secondary | ICD-10-CM | POA: Diagnosis not present

## 2017-05-21 DIAGNOSIS — M5380 Other specified dorsopathies, site unspecified: Secondary | ICD-10-CM | POA: Diagnosis not present

## 2017-05-29 DIAGNOSIS — M5416 Radiculopathy, lumbar region: Secondary | ICD-10-CM | POA: Diagnosis not present

## 2017-06-01 DIAGNOSIS — M5416 Radiculopathy, lumbar region: Secondary | ICD-10-CM | POA: Diagnosis not present

## 2017-06-03 DIAGNOSIS — M5416 Radiculopathy, lumbar region: Secondary | ICD-10-CM | POA: Diagnosis not present

## 2017-06-30 DIAGNOSIS — M5126 Other intervertebral disc displacement, lumbar region: Secondary | ICD-10-CM | POA: Diagnosis not present

## 2017-06-30 DIAGNOSIS — M5416 Radiculopathy, lumbar region: Secondary | ICD-10-CM | POA: Diagnosis not present

## 2017-06-30 DIAGNOSIS — M5136 Other intervertebral disc degeneration, lumbar region: Secondary | ICD-10-CM | POA: Diagnosis not present

## 2017-07-02 DIAGNOSIS — M5416 Radiculopathy, lumbar region: Secondary | ICD-10-CM | POA: Diagnosis not present

## 2017-07-05 IMAGING — US US ABDOMEN COMPLETE
1 series · 13 of 25 positions shown · non-contrast
Comparison: None in PACs

CLINICAL DATA: Six months of right upper quadrant pain especially
with activity, no associated nausea or vomiting

EXAM:
ULTRASOUND ABDOMEN COMPLETE

[Series 1: us abdomen complete · 0.30mm/px · 13 of 73 slices shown]
[im 1/73]
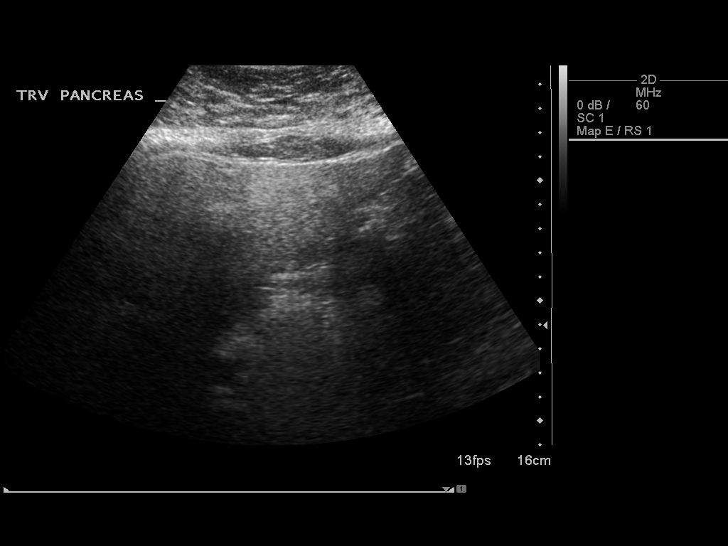
[im 7/73]
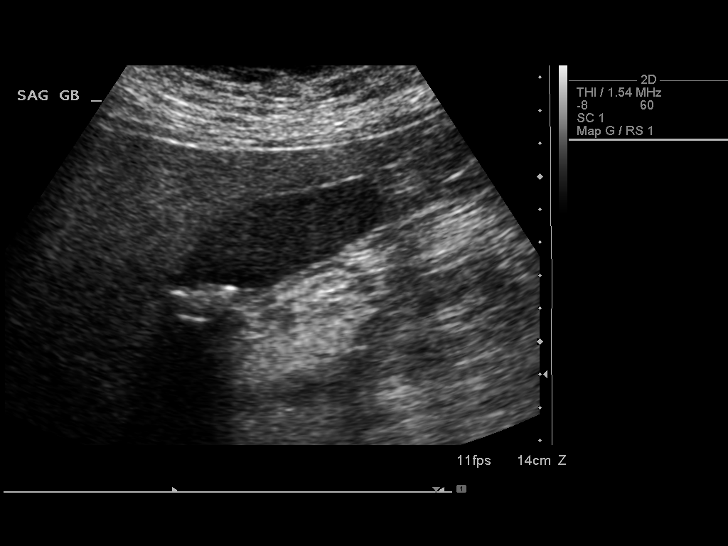
[im 13/73]
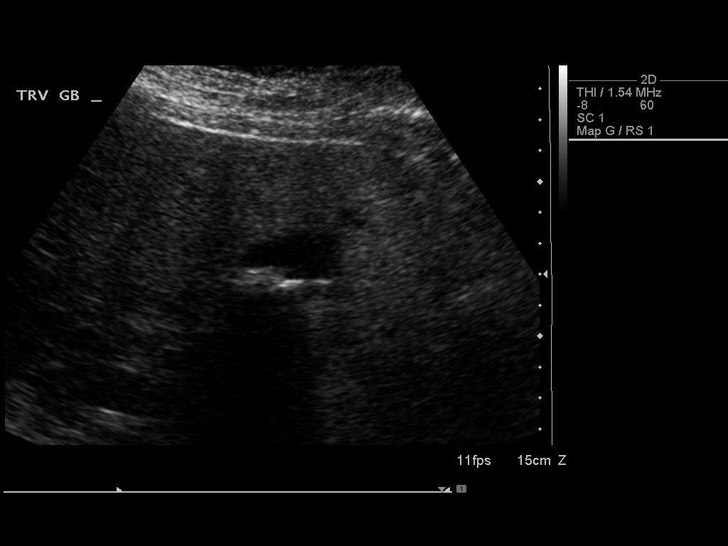
[im 19/73]
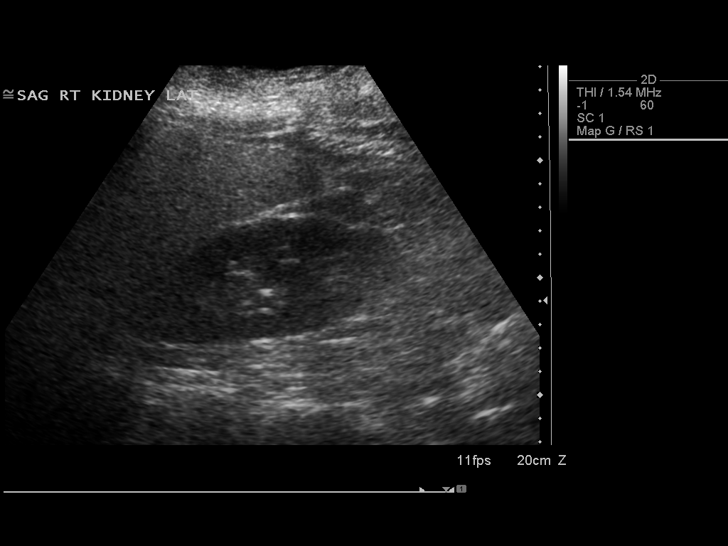
[im 25/73]
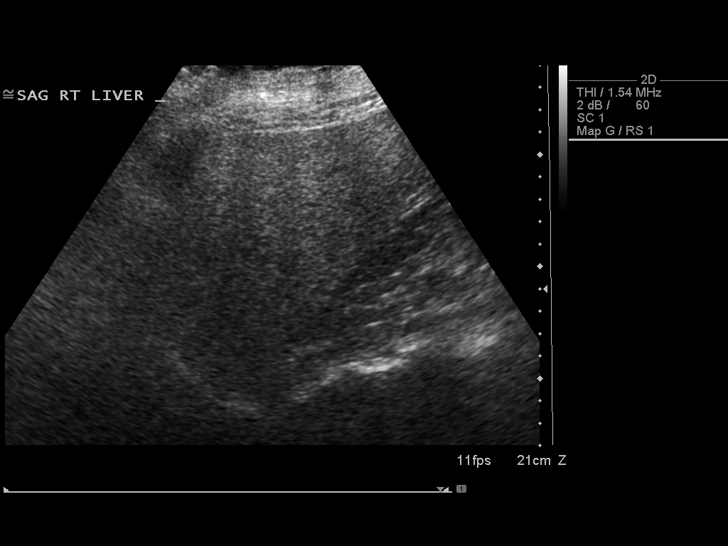
[im 31/73]
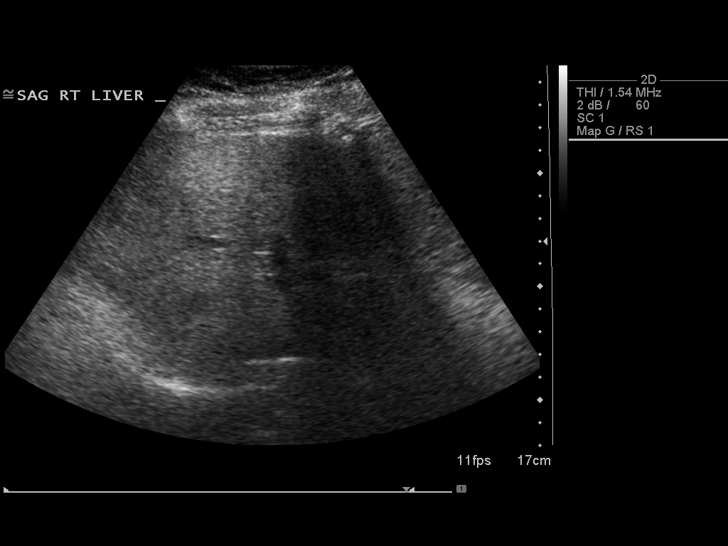
[im 37/73]
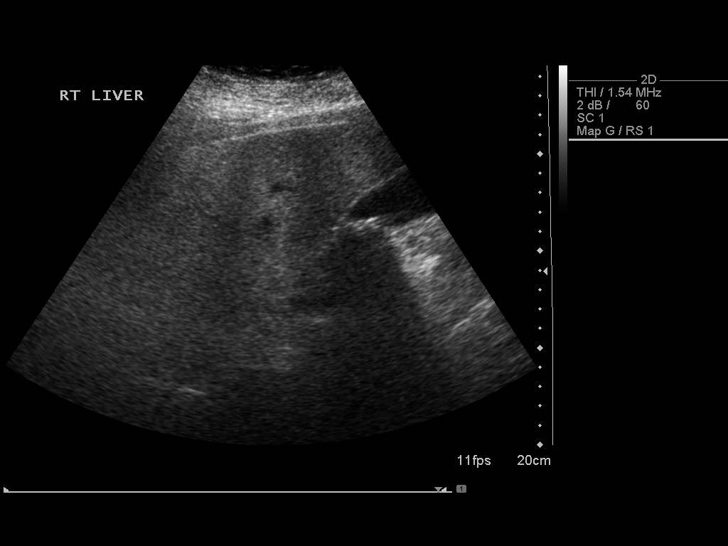
[im 43/73]
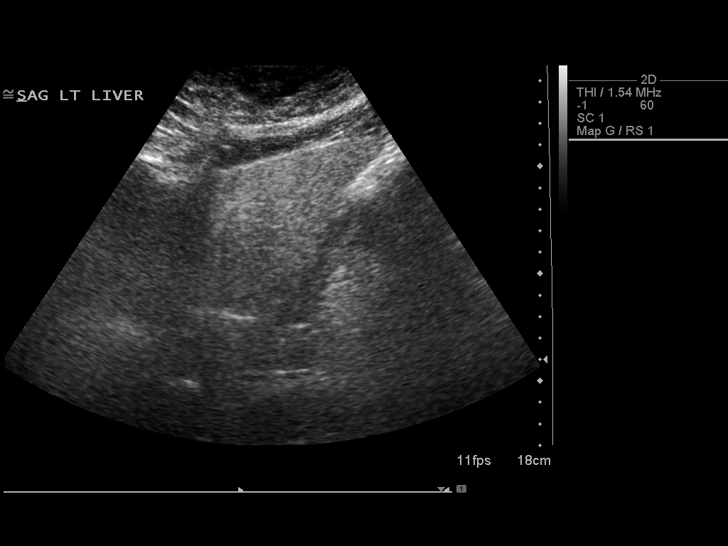
[im 49/73]
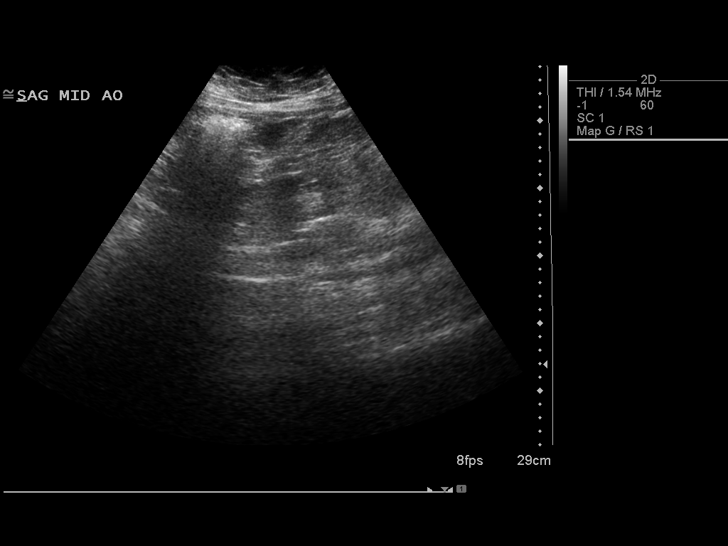
[im 55/73]
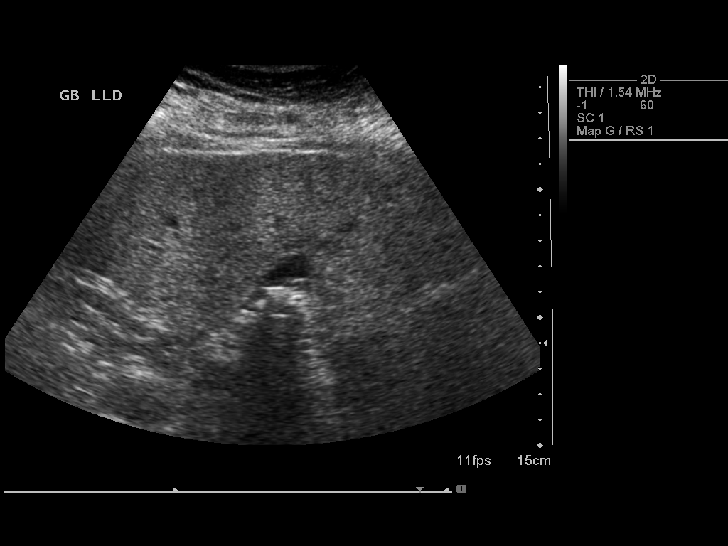
[im 61/73]
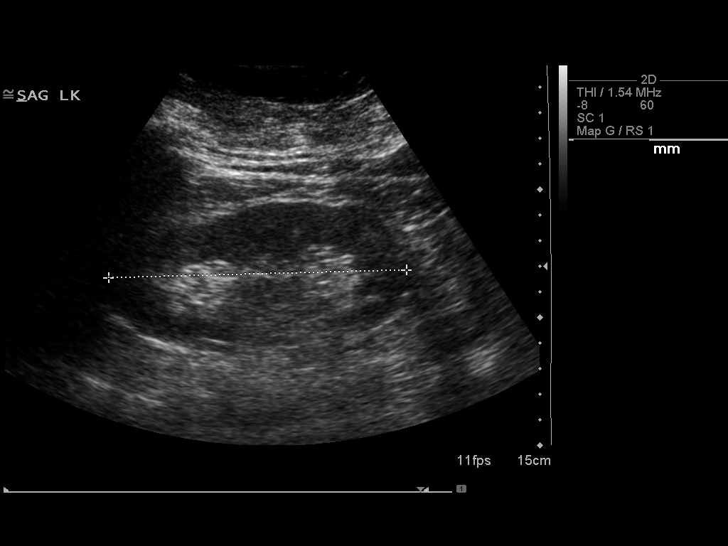
[im 67/73]
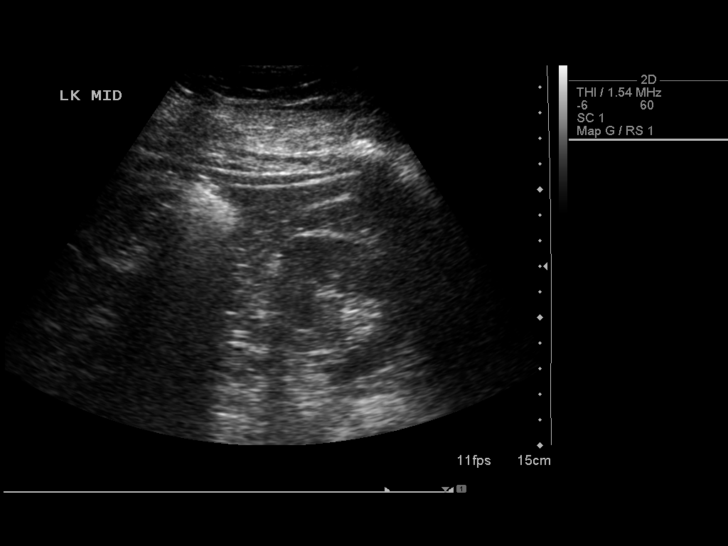
[im 73/73]
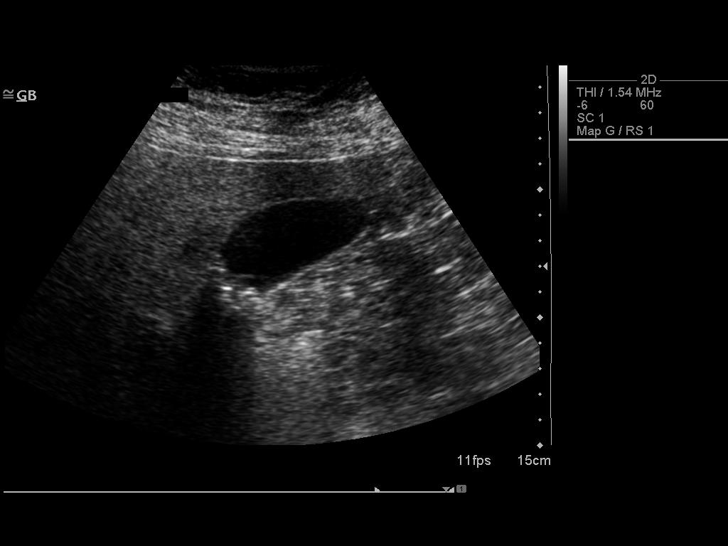

[13 of 25 positions shown; findings below may reference images not displayed]

FINDINGS: Gallbladder: The gallbladder is adequately distended. There are
multiple echogenic mobile shadowing stones. There is no gallbladder
wall thickening, pericholecystic fluid, or positive sonographic
Murphy's sign.

Common bile duct: Diameter: 3.6 mm

Liver: The hepatic echotexture is mildly heterogeneous. There is no
focal mass or ductal dilation. The surface contour of the liver is
normal.

IVC: Bowel gas obscured the inferior vena cava.

Pancreas: The pancreas was largely obscured by bowel gas.

Spleen: Size and appearance within normal limits.

Right Kidney: Length: 10.9 cm. Echogenicity within normal limits. No
mass or hydronephrosis visualized.

Left Kidney: Length: 12.6 cm. Echogenicity within normal limits. No
mass or hydronephrosis visualized.

Abdominal aorta: Bowel gas largely obscured the abdominal aorta.

Other findings: There is no ascites.
IMPRESSION: 1. Multiple mobile gallstones. No sonographic evidence of acute
cholecystitis.
2. Heterogeneous hepatic echotexture may reflect fatty infiltrative
change. There is no focal mass.
3. The kidneys are unremarkable. Obscuration of the pancreas,
abdominal aorta, and IVC due to bowel gas.

## 2017-07-08 ENCOUNTER — Other Ambulatory Visit (INDEPENDENT_AMBULATORY_CARE_PROVIDER_SITE_OTHER): Payer: Federal, State, Local not specified - PPO

## 2017-07-08 ENCOUNTER — Telehealth: Payer: Self-pay

## 2017-07-08 ENCOUNTER — Encounter: Payer: Self-pay | Admitting: Internal Medicine

## 2017-07-08 ENCOUNTER — Ambulatory Visit (INDEPENDENT_AMBULATORY_CARE_PROVIDER_SITE_OTHER): Payer: Federal, State, Local not specified - PPO | Admitting: Internal Medicine

## 2017-07-08 VITALS — BP 132/90 | HR 73 | Temp 98.1°F | Ht 61.0 in | Wt 290.0 lb

## 2017-07-08 DIAGNOSIS — Z0000 Encounter for general adult medical examination without abnormal findings: Secondary | ICD-10-CM | POA: Diagnosis not present

## 2017-07-08 DIAGNOSIS — K529 Noninfective gastroenteritis and colitis, unspecified: Secondary | ICD-10-CM | POA: Diagnosis not present

## 2017-07-08 DIAGNOSIS — M159 Polyosteoarthritis, unspecified: Secondary | ICD-10-CM

## 2017-07-08 DIAGNOSIS — M8949 Other hypertrophic osteoarthropathy, multiple sites: Secondary | ICD-10-CM

## 2017-07-08 DIAGNOSIS — M150 Primary generalized (osteo)arthritis: Secondary | ICD-10-CM

## 2017-07-08 DIAGNOSIS — I10 Essential (primary) hypertension: Secondary | ICD-10-CM | POA: Diagnosis not present

## 2017-07-08 LAB — LIPID PANEL
Cholesterol: 218 mg/dL — ABNORMAL HIGH (ref 0–200)
HDL: 53 mg/dL (ref >39.00)
LDL CALC: 136 mg/dL — AB (ref 0–99)
NonHDL: 165.14
TRIGLYCERIDES: 148 mg/dL (ref 0.0–149.0)
Total CHOL/HDL Ratio: 4
VLDL: 29.6 mg/dL (ref 0.0–40.0)

## 2017-07-08 LAB — HEMOGLOBIN A1C: HEMOGLOBIN A1C: 5.9 % (ref 4.6–6.5)

## 2017-07-08 LAB — COMPREHENSIVE METABOLIC PANEL
ALT: 16 U/L (ref 0–35)
AST: 13 U/L (ref 0–37)
Albumin: 4 g/dL (ref 3.5–5.2)
Alkaline Phosphatase: 51 U/L (ref 39–117)
BUN: 11 mg/dL (ref 6–23)
CALCIUM: 9.4 mg/dL (ref 8.4–10.5)
CHLORIDE: 102 meq/L (ref 96–112)
CO2: 28 mEq/L (ref 19–32)
Creatinine, Ser: 0.73 mg/dL (ref 0.40–1.20)
GFR: 88.76 mL/min (ref >60.00)
Glucose, Bld: 96 mg/dL (ref 70–99)
POTASSIUM: 4.7 meq/L (ref 3.5–5.1)
SODIUM: 139 meq/L (ref 135–145)
Total Bilirubin: 0.4 mg/dL (ref 0.2–1.2)
Total Protein: 7.5 g/dL (ref 6.0–8.3)

## 2017-07-08 LAB — CBC
HEMATOCRIT: 40.8 % (ref 36.0–46.0)
Hemoglobin: 13.4 g/dL (ref 12.0–15.0)
MCHC: 33 g/dL (ref 30.0–36.0)
MCV: 87.3 fl (ref 78.0–100.0)
Platelets: 264 10*3/uL (ref 150.0–400.0)
RBC: 4.67 Mil/uL (ref 3.87–5.11)
RDW: 16.3 % — ABNORMAL HIGH (ref 11.5–15.5)
WBC: 7 10*3/uL (ref 4.0–10.5)

## 2017-07-08 LAB — VITAMIN B12: VITAMIN B 12: 169 pg/mL — AB (ref 211–911)

## 2017-07-08 LAB — TSH: TSH: 1.72 u[IU]/mL (ref 0.35–4.50)

## 2017-07-08 MED ORDER — LISINOPRIL 5 MG PO TABS
5.0000 mg | ORAL_TABLET | Freq: Every day | ORAL | 1 refills | Status: DC
Start: 2017-07-08 — End: 2018-01-05

## 2017-07-08 MED ORDER — DIPHENOXYLATE-ATROPINE 2.5-0.025 MG PO TABS: 1.0000 | ORAL_TABLET | Freq: Four times a day (QID) | ORAL | 0 refills | Status: DC | PRN

## 2017-07-08 MED ORDER — DULOXETINE HCL 60 MG PO CPEP: 60.0000 mg | ORAL_CAPSULE | Freq: Every day | ORAL | 1 refills | Status: DC

## 2017-07-08 MED ORDER — ONDANSETRON HCL 4 MG PO TABS: 4.0000 mg | ORAL_TABLET | Freq: Three times a day (TID) | ORAL | 1 refills | Status: DC | PRN

## 2017-07-08 NOTE — Telephone Encounter (Signed)
Patient states that she brought up her leg and feet cramps that she gets on and off. States that she takes a spoon full of mustard because she heard that it helps but it gets bad sometimes. Is not sure if there is something that she could try or a medication that could work for it. Told patient I would send a message to PCP

## 2017-07-08 NOTE — Patient Instructions (Addendum)
[employer] is the book we talked about with Charlaine Dalton  The medicines that are for weight loss are saxena, contrave, belviq, qsymia.   We are checking the labs today.   We have sent in cymbalta to replace the setraline. This is 1 pill daily and may help more with the nerve pain as well as the mood.

## 2017-07-08 NOTE — Progress Notes (Signed)
   Subjective:    Patient ID: Christine Ramirez, female    DOB: 1966/02/14, 52 y.o.   MRN: [id number]  HPI The patient is a 52 YO female coming in for physical. Some concerns including weight, stress, and cramps in her feet.   PMH, [facility], social history reviewed and updated  Review of Systems  Constitutional: Positive for activity change and unexpected weight change.  HENT: Negative.   Eyes: Negative.   Respiratory: Positive for shortness of breath. Negative for cough and chest tightness.   Cardiovascular: Negative for chest pain, palpitations and leg swelling.  Gastrointestinal: Negative for abdominal distention, abdominal pain, constipation, diarrhea, nausea and vomiting.  Musculoskeletal: Positive for arthralgias and myalgias.  Skin: Negative.   Neurological: Negative.   Psychiatric/Behavioral: Negative.       Objective:   Physical Exam  Constitutional: She is oriented to person, place, and time. She appears well-developed and well-nourished.  HENT:  Head: Normocephalic and atraumatic.  Eyes: EOM are normal.  Neck: Normal range of motion.  Cardiovascular: Normal rate and regular rhythm.  Pulmonary/Chest: Effort normal and breath sounds normal. No respiratory distress. She has no wheezes. She has no rales.  Abdominal: Soft. Bowel sounds are normal. She exhibits no distension. There is no tenderness. There is no rebound.  Musculoskeletal: She exhibits no edema.  Neurological: She is alert and oriented to person, place, and time. Coordination normal.  Skin: Skin is warm and dry.  Psychiatric: She has a normal mood and affect.   Vitals:   07/08/17 1058  BP: 132/90  Pulse: 73  Temp: 98.1 F (36.7 C)  TempSrc: Oral  SpO2: 99%  Weight: 290 lb (131.5 kg)  Height:  (1.549 m)      Assessment & Plan:

## 2017-07-10 DIAGNOSIS — Z0000 Encounter for general adult medical examination without abnormal findings: Secondary | ICD-10-CM | POA: Insufficient documentation

## 2017-07-10 NOTE — Assessment & Plan Note (Signed)
Taking lisinopril 5 mg daily and checking CMP and adjust as needed. BP at goal.

## 2017-07-10 NOTE — Assessment & Plan Note (Signed)
She is struggling with her weight still and checking labs for any new complications. Complicated by OA and HTN.

## 2017-07-10 NOTE — Assessment & Plan Note (Signed)
Will switch zoloft to setraline to help with pains as well. Cramps in feet could be related to electrolytes so checking those. Adjust as needed.

## 2017-07-10 NOTE — Assessment & Plan Note (Signed)
Flu shot counseled. Tetanus up to date. Colonoscopy up to date. Mammogram up to date, pap smear with gyn. Counseled about sun safety and mole surveillance. Counseled about the dangers of distracted driving. Given 10 year screening recommendations.

## 2017-07-10 NOTE — Assessment & Plan Note (Signed)
Refill lomotil. This is chronic since gallbladder removal.

## 2017-07-13 NOTE — Telephone Encounter (Signed)
See result note.  

## 2017-07-16 DIAGNOSIS — M25561 Pain in right knee: Secondary | ICD-10-CM | POA: Diagnosis not present

## 2017-07-16 DIAGNOSIS — M170 Bilateral primary osteoarthritis of knee: Secondary | ICD-10-CM | POA: Diagnosis not present

## 2017-07-16 DIAGNOSIS — M5416 Radiculopathy, lumbar region: Secondary | ICD-10-CM | POA: Diagnosis not present

## 2017-07-16 DIAGNOSIS — Z6841 Body Mass Index (BMI) 40.0 and over, adult: Secondary | ICD-10-CM | POA: Diagnosis not present

## 2017-07-21 ENCOUNTER — Ambulatory Visit: Payer: Federal, State, Local not specified - PPO

## 2017-07-21 ENCOUNTER — Telehealth: Payer: Self-pay | Admitting: Internal Medicine

## 2017-07-21 DIAGNOSIS — E538 Deficiency of other specified B group vitamins: Secondary | ICD-10-CM

## 2017-07-21 MED ORDER — CYANOCOBALAMIN 1000 MCG/ML IJ SOLN
1000.0000 ug | Freq: Once | INTRAMUSCULAR | Status: AC
  Administered 2017-07-21: 1000 ug via INTRAMUSCULAR

## 2017-07-21 NOTE — Progress Notes (Signed)
See injection admin in other encounter

## 2017-07-21 NOTE — Telephone Encounter (Signed)
Patient has dropped of FMLA forms to be completed.  Patients last visit was 5/15 with Dr. Okey Dupre.  Patient is requesting the FMLA to allow patient time to take off for her b12 injections and any other appointments patient may need with Dr. Okey Dupre.  Placing in Brittany's box.

## 2017-07-22 NOTE — Progress Notes (Signed)
error 

## 2017-07-22 NOTE — Telephone Encounter (Signed)
Dr. Okey Dupre are you aware of this? Do you approve?  If so would you like for me to complete the forms, If so what would be the frequency?

## 2017-07-23 NOTE — Telephone Encounter (Signed)
She can give shots at home herself if this is causing too much of a burden off work to get them here, would she like to do that?

## 2017-07-23 NOTE — Telephone Encounter (Signed)
Call patient to inform her of the notes below, LVM for her to call back and inform if the B12 injections at home will work better for her.

## 2017-08-05 ENCOUNTER — Ambulatory Visit (INDEPENDENT_AMBULATORY_CARE_PROVIDER_SITE_OTHER): Payer: Federal, State, Local not specified - PPO

## 2017-08-05 DIAGNOSIS — E538 Deficiency of other specified B group vitamins: Secondary | ICD-10-CM

## 2017-08-05 MED ORDER — CYANOCOBALAMIN 1000 MCG/ML IJ SOLN
1000.0000 ug | Freq: Once | INTRAMUSCULAR | Status: AC
  Administered 2017-08-05: 1000 ug via INTRAMUSCULAR

## 2017-08-06 NOTE — Progress Notes (Signed)
Medical treatment/procedure(s) were performed by non-physician practitioner and as supervising physician I was immediately available for consultation/collaboration. I agree with above. Elizabeth A Crawford, MD  

## 2017-08-08 IMAGING — CR DG CHEST 2V
2 series · 2 of 2 positions shown · non-contrast
Comparison: No prior.

CLINICAL DATA: Gallbladder surgery.

EXAM:
CHEST  2 VIEW

[w chest pa]
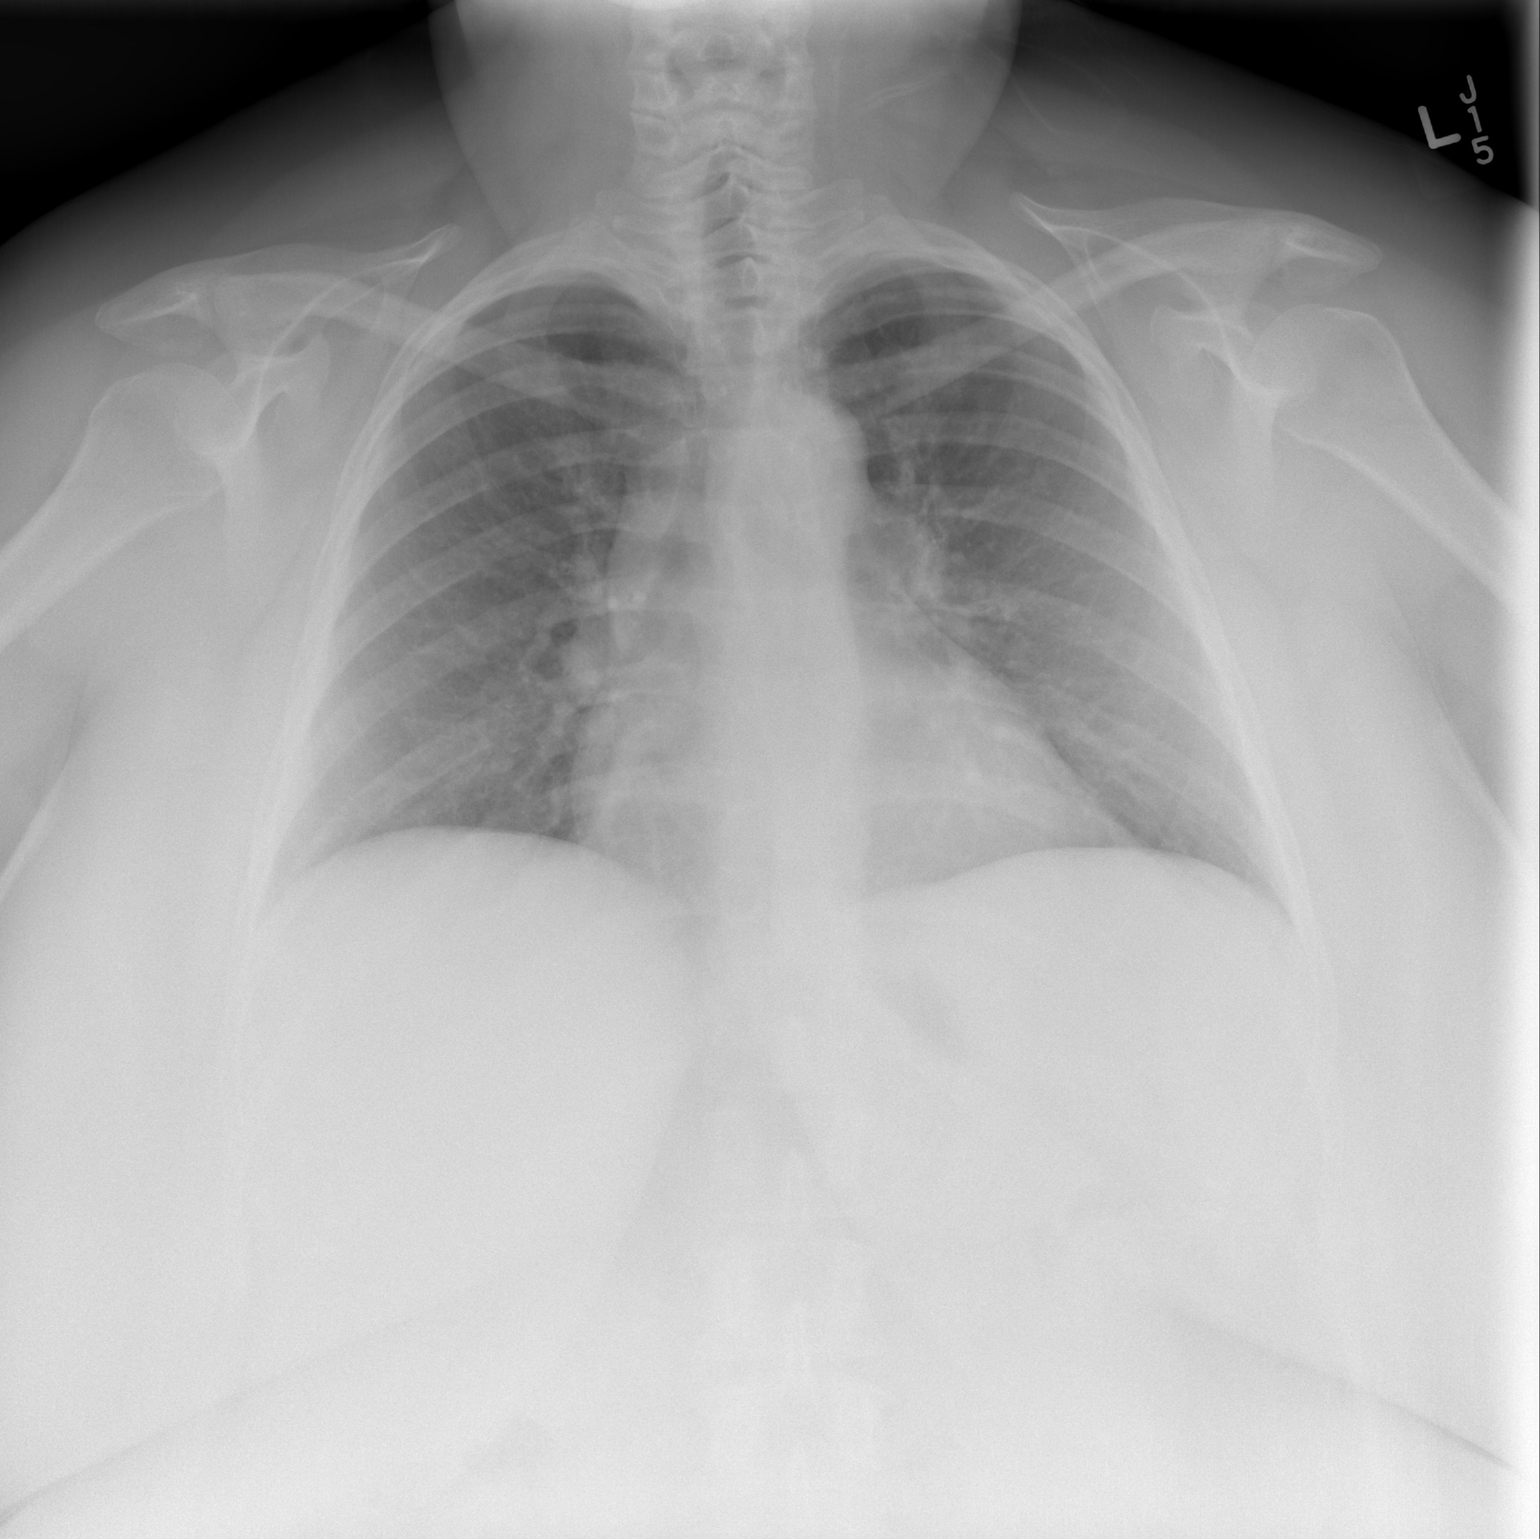

[w chest lat *]
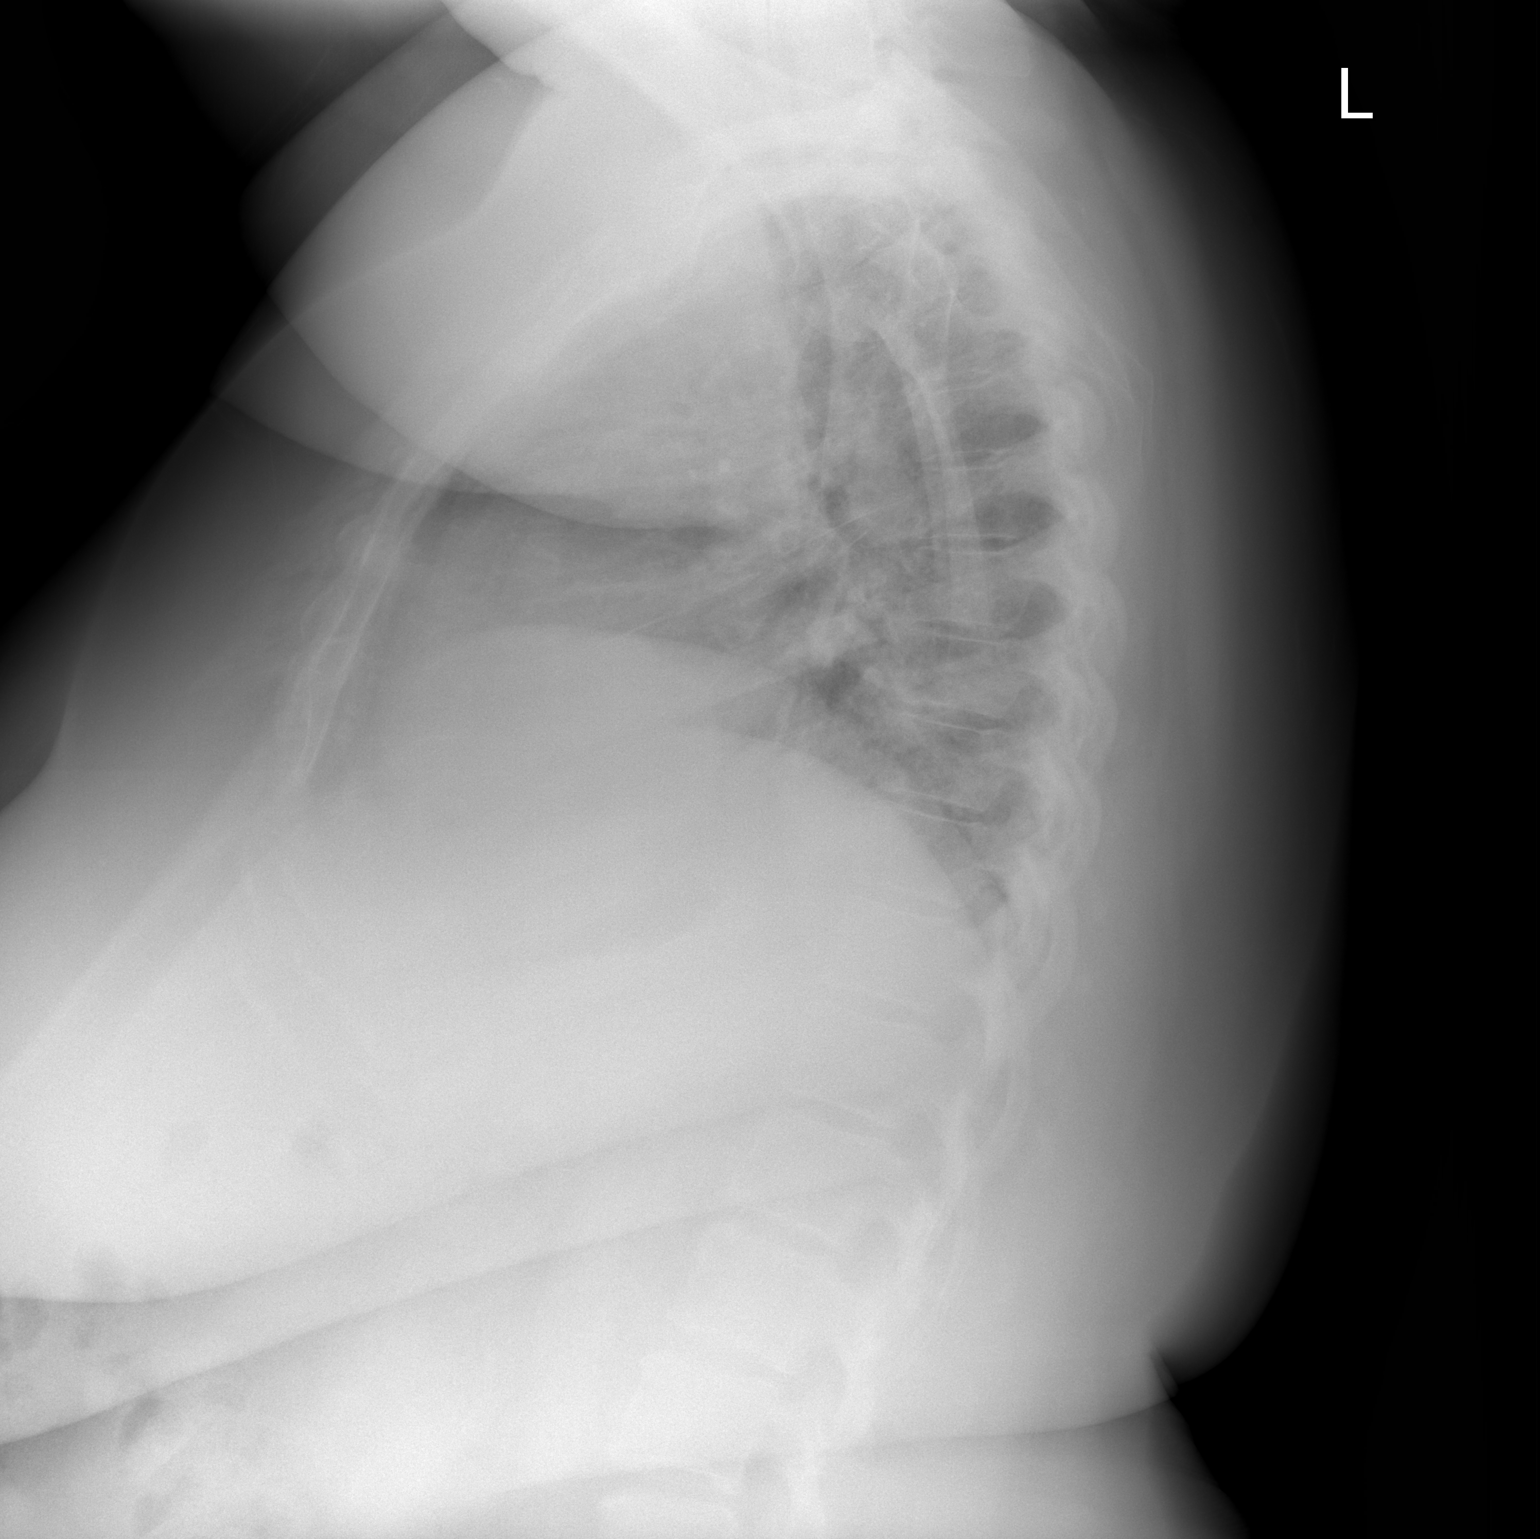

[2 of 2 positions shown; findings below may reference images not displayed]

FINDINGS: Mediastinum hilar structures normal. Low lung volumes. Heart size
normal. No pleural effusion or pneumothorax. No acute bony
abnormality.
IMPRESSION: Low lung volumes, otherwise negative chest.

## 2017-08-19 ENCOUNTER — Ambulatory Visit (INDEPENDENT_AMBULATORY_CARE_PROVIDER_SITE_OTHER): Payer: Federal, State, Local not specified - PPO

## 2017-08-19 DIAGNOSIS — E538 Deficiency of other specified B group vitamins: Secondary | ICD-10-CM

## 2017-08-19 MED ORDER — CYANOCOBALAMIN 1000 MCG/ML IJ SOLN
1000.0000 ug | Freq: Once | INTRAMUSCULAR | Status: AC
  Administered 2017-08-19: 1000 ug via INTRAMUSCULAR

## 2017-08-20 NOTE — Progress Notes (Signed)
Medical treatment/procedure(s) were performed by non-physician practitioner and as supervising physician I was immediately available for consultation/collaboration. I agree with above. Elizabeth A Crawford, MD  

## 2017-09-02 ENCOUNTER — Ambulatory Visit: Payer: Federal, State, Local not specified - PPO

## 2017-09-03 ENCOUNTER — Ambulatory Visit (INDEPENDENT_AMBULATORY_CARE_PROVIDER_SITE_OTHER): Payer: Federal, State, Local not specified - PPO | Admitting: *Deleted

## 2017-09-03 DIAGNOSIS — E538 Deficiency of other specified B group vitamins: Secondary | ICD-10-CM | POA: Diagnosis not present

## 2017-09-03 MED ORDER — CYANOCOBALAMIN 1000 MCG/ML IJ SOLN
1000.0000 ug | Freq: Once | INTRAMUSCULAR | Status: AC
  Administered 2017-09-03: 1000 ug via INTRAMUSCULAR

## 2017-09-03 NOTE — Progress Notes (Signed)
Medical treatment/procedure(s) were performed by non-physician practitioner and as supervising physician I was immediately available for consultation/collaboration. I agree with above. Anyely Cunning A Audrick Lamoureaux, MD  

## 2017-10-05 ENCOUNTER — Ambulatory Visit: Payer: Federal, State, Local not specified - PPO

## 2017-10-07 ENCOUNTER — Ambulatory Visit (INDEPENDENT_AMBULATORY_CARE_PROVIDER_SITE_OTHER): Payer: Federal, State, Local not specified - PPO | Admitting: *Deleted

## 2017-10-07 DIAGNOSIS — E538 Deficiency of other specified B group vitamins: Secondary | ICD-10-CM | POA: Diagnosis not present

## 2017-10-07 MED ORDER — CYANOCOBALAMIN 1000 MCG/ML IJ SOLN
1000.0000 ug | Freq: Once | INTRAMUSCULAR | Status: AC
  Administered 2017-10-07: 1000 ug via INTRAMUSCULAR

## 2017-10-08 NOTE — Progress Notes (Signed)
Medical treatment/procedure(s) were performed by non-physician practitioner and as supervising physician I was immediately available for consultation/collaboration. I agree with above. Rykker Coviello A Mckensi Redinger, MD  

## 2017-10-13 DIAGNOSIS — S83241D Other tear of medial meniscus, current injury, right knee, subsequent encounter: Secondary | ICD-10-CM | POA: Diagnosis not present

## 2017-10-13 DIAGNOSIS — M170 Bilateral primary osteoarthritis of knee: Secondary | ICD-10-CM | POA: Diagnosis not present

## 2017-10-16 DIAGNOSIS — M170 Bilateral primary osteoarthritis of knee: Secondary | ICD-10-CM | POA: Diagnosis not present

## 2017-10-20 DIAGNOSIS — M25561 Pain in right knee: Secondary | ICD-10-CM | POA: Diagnosis not present

## 2017-11-04 DIAGNOSIS — S83249A Other tear of medial meniscus, current injury, unspecified knee, initial encounter: Secondary | ICD-10-CM | POA: Insufficient documentation

## 2017-11-04 DIAGNOSIS — M1711 Unilateral primary osteoarthritis, right knee: Secondary | ICD-10-CM | POA: Diagnosis not present

## 2017-11-04 DIAGNOSIS — S83241A Other tear of medial meniscus, current injury, right knee, initial encounter: Secondary | ICD-10-CM | POA: Diagnosis not present

## 2017-11-04 DIAGNOSIS — M25561 Pain in right knee: Secondary | ICD-10-CM | POA: Diagnosis not present

## 2017-11-05 ENCOUNTER — Telehealth: Payer: Self-pay | Admitting: Internal Medicine

## 2017-11-05 NOTE — Telephone Encounter (Signed)
Placed in MD folder to sign  

## 2017-11-05 NOTE — Telephone Encounter (Signed)
Pt dropped off surgical clearance form at 5 pm on on 11/04/17 from [facility] for completion.  Please fax completed form to [phone].  Form put in Dr. Frutoso Chaserawford's box for pick up & completion.

## 2017-12-25 ENCOUNTER — Other Ambulatory Visit: Payer: Self-pay | Admitting: Internal Medicine

## 2018-01-04 ENCOUNTER — Ambulatory Visit: Payer: Self-pay | Admitting: Orthopedic Surgery

## 2018-01-05 ENCOUNTER — Other Ambulatory Visit: Payer: Self-pay | Admitting: Internal Medicine

## 2018-02-23 ENCOUNTER — Ambulatory Visit: Payer: Self-pay | Admitting: Orthopedic Surgery

## 2018-02-23 NOTE — H&P (Signed)
Christine Ramirez is an 52 y.o. female.   Chief Complaint: R knee pain HPI: Reported by patient. Reason for Visit: Diagnositc Results (right knee MRI) Context: The patient is months out from when symptoms began. Severity: pain level 2-3/10 Medications: The patient is not taking any medications for the knee  Past Medical History:  Diagnosis Date  . Anemia   . Anxiety   . COPD (chronic obstructive pulmonary disease) (HCC)   . Depression   . Fatty liver   . GERD (gastroesophageal reflux disease)   . H/O typhoid fever    age 48  . Headache   . Heart murmur    as a child  . Hepatitis    age 89  . Hyperlipidemia   . Hypertension   . Jaundice    age 89  . Pneumonia    in past  . PONV (postoperative nausea and vomiting)   . Scarlet fever    age 89  . Shortness of breath dyspnea     Past Surgical History:  Procedure Laterality Date  . ABDOMINAL EXPLORATION SURGERY  1975   age 89 [facility]  . APPENDECTOMY    . CHOLECYSTECTOMY N/A 03/19/2015   Procedure: LAPAROSCOPIC CHOLECYSTECTOMY WITH INTRAOPERATIVE CHOLANGIOGRAM;  Surgeon: Gaynelle AduEric Wilson, MD;  Location: [facility];  Service: General;  Laterality: N/A;  . CHOLECYSTECTOMY    . COLONOSCOPY WITH PROPOFOL N/A 07/24/2016   Procedure: COLONOSCOPY WITH PROPOFOL;  Surgeon: Charlott RakesSchooler, Vincent, MD;  [facility];  Service: Endoscopy;  Laterality: N/A;  . DILATION AND CURETTAGE OF UTERUS  2000  . KNEE SURGERY Right     Family History  Problem Relation Age of Onset  . Cancer Mother   . Melanoma Mother   . Arthritis Father   . Cancer Maternal Grandmother   . Heart disease Maternal Grandmother   . Cancer Maternal Grandfather   . Heart disease Maternal Grandfather   . Depression Paternal Grandmother   . Alcohol abuse Paternal Grandfather    Social History:  reports that she has never smoked. She has never used smokeless tobacco. She reports current alcohol use. She reports that she does not use  drugs.  Allergies: No Known Allergies  Medications: diphenoxylate-atropine 2.5 mg-0.025 mg tablet DULoxetine 60 mg capsule,delayed release gabapentin 300 mg capsule lisinopril 5 mg tablet mupirocin 2 % topical ointment ondansetron HCl 4 mg tablet sertraline 100 mg tablet Tylenol PM Extra Strength 25 mg-500 mg tablet  Review of Systems  Constitutional: Negative.   HENT: Negative.   Eyes: Negative.   Respiratory: Negative.   Cardiovascular: Negative.   Gastrointestinal: Negative.   Genitourinary: Negative.   Musculoskeletal: Positive for joint pain.  Skin: Negative.   Neurological: Negative.   Psychiatric/Behavioral: Negative.     Last menstrual period 07/23/2016. Physical Exam  Constitutional: She is oriented to person, place, and time.  obese  HENT:  Head: Normocephalic.  Eyes: Pupils are equal, round, and reactive to light.  Neck: Normal range of motion.  Cardiovascular: Normal rate.  Respiratory: Effort normal.  GI: Soft.  Musculoskeletal:     Comments: Patient is a 52 year old female.  Constitutional: General Appearance: healthy-appearing and NAD.  Gait and Station: Appearance: antalgic gait.  Cardiovascular System: Arterial Pulses Right: femoral normal, popliteal normal, dorsalis pedis normal, and posterior tibialis normal. Edema Right: no edema. Varicosities Right: no varicosities. Varicosities Left: no varicosities and capillary refill test normal.  Lymph Nodes: Inspection/Palpation Right: no inguinal LAD.  Knees: Inspection Right: no deformity and swelling.  Bony Palpation Right: no tenderness of the inferior pole patella, the superior pole patella, the tibial tubercle, the medial tibial plateau, Gerdy's tubercle, or the neck of fibula and tenderness of the lateral joint line and the medial joint line. Soft Tissue Palpation Right: no tenderness of the quadriceps tendon, the prepatellar bursa, the patellar tendon, the medial collateral ligament, the saphenous  nerve, the lateral collateral ligament, the infrapatellar tendon, or the common peroneal nerve. Active Range of Motion Right: limited. Passive Range of Motion RIght: limited. Stability Right: no laxity or ligamentous instability and anterior drawer sign negative, posterior drawer sign negative, and Lachman test negative. Special Tests Right: McMurray's test negative. Strength Right: no hamstring weakness or quadriceps weakness and flexion 5/5 and extension 5/5.  Skin: Right Lower Extremity: normal.  Neurologic: Ankle Reflex Right: normal (2). Knee Reflex Right: normal (2). Sensation on the Right: L2 normal, L3 normal, L4 normal, L5 normal, and S1 normal.  Psychiatric: Mood and Affect: active and alert and normal mood.  Neurological: She is alert and oriented to person, place, and time.  Skin: Skin is warm and dry.    MRI of the knee demonstrates complex tear of the medial meniscus with some extrusion. There is some edema in the tibial plateau.  Underlying osteoarthrosis of the knee that it is moderately severe.  Assessment/Plan Impression: R knee DJD, MMT  We discussed options total knee replacement. She is working on her BMI at this point it would preclude that.  Other options would be to consider a knee arthroscopy and partial medial meniscectomy and debridement for mechanical symptoms. Within discussed Visco supplementation.  Other option is just to live with her symptoms until she reduces her BMI.  Mutually agreed to proceed with an arthroscopic debridement.  I discussed the risk and benefits of knee arthroscopy including no changes in their symptoms worsening in their symptoms DVT, PE, anesthetic complications etc. Surgical possibilities include chondroplasty, microfracture, partial meniscectomy, plica excision etc. We also discussed the possible need for repeat arthroscopy in the future as well as possible continued treatment including corticosteroid injections and possible Visco  supplementation. In addition we discussed the possibility of even eventually required a total knee replacement if significant arthritis encountered. Also indicating that it is an outpatient procedure. 1-2 days on crutches. Postoperative DVT prophylaxis with aspirin if tolerated. Follow-up in the office in 2 weeks following the surgery. Possible consideration of formal of supervised physical therapy as well.  Plan R knee scope, debridement, partial medial meniscectomy  Dorothy SparkBISSELL, Lynze Reddy M., PA-C for Dr. Shelle IronBeane 02/23/2018, 2:27 PM

## 2018-03-10 ENCOUNTER — Encounter (HOSPITAL_COMMUNITY): Admission: RE | Payer: Self-pay | Source: Ambulatory Visit

## 2018-03-10 ENCOUNTER — Ambulatory Visit (HOSPITAL_COMMUNITY)
Admission: RE | Admit: 2018-03-10 | Payer: Federal, State, Local not specified - PPO | Source: Ambulatory Visit | Admitting: Specialist

## 2018-03-10 SURGERY — ARTHROSCOPY, KNEE, WITH MEDIAL MENISCECTOMY
Anesthesia: General | Laterality: Right

## 2018-06-18 ENCOUNTER — Other Ambulatory Visit: Payer: Self-pay | Admitting: Internal Medicine

## 2018-07-02 ENCOUNTER — Other Ambulatory Visit: Payer: Self-pay | Admitting: Internal Medicine

## 2018-07-14 DIAGNOSIS — Z1231 Encounter for screening mammogram for malignant neoplasm of breast: Secondary | ICD-10-CM | POA: Diagnosis not present

## 2018-07-14 DIAGNOSIS — Z01419 Encounter for gynecological examination (general) (routine) without abnormal findings: Secondary | ICD-10-CM | POA: Diagnosis not present

## 2018-07-14 DIAGNOSIS — R109 Unspecified abdominal pain: Secondary | ICD-10-CM | POA: Diagnosis not present

## 2018-07-14 DIAGNOSIS — Z118 Encounter for screening for other infectious and parasitic diseases: Secondary | ICD-10-CM | POA: Diagnosis not present

## 2018-07-14 DIAGNOSIS — Z6841 Body Mass Index (BMI) 40.0 and over, adult: Secondary | ICD-10-CM | POA: Diagnosis not present

## 2018-07-14 DIAGNOSIS — Z1151 Encounter for screening for human papillomavirus (HPV): Secondary | ICD-10-CM | POA: Diagnosis not present

## 2018-07-15 DIAGNOSIS — R1030 Lower abdominal pain, unspecified: Secondary | ICD-10-CM | POA: Diagnosis not present

## 2018-09-01 DIAGNOSIS — M25551 Pain in right hip: Secondary | ICD-10-CM | POA: Insufficient documentation

## 2018-09-03 DIAGNOSIS — M25552 Pain in left hip: Secondary | ICD-10-CM | POA: Diagnosis not present

## 2018-09-03 DIAGNOSIS — M1611 Unilateral primary osteoarthritis, right hip: Secondary | ICD-10-CM | POA: Diagnosis not present

## 2018-09-03 DIAGNOSIS — M533 Sacrococcygeal disorders, not elsewhere classified: Secondary | ICD-10-CM | POA: Diagnosis not present

## 2018-09-03 DIAGNOSIS — M25551 Pain in right hip: Secondary | ICD-10-CM | POA: Diagnosis not present

## 2018-09-10 ENCOUNTER — Other Ambulatory Visit: Payer: Self-pay | Admitting: Internal Medicine

## 2018-10-02 ENCOUNTER — Other Ambulatory Visit: Payer: Self-pay | Admitting: Internal Medicine

## 2018-10-09 ENCOUNTER — Other Ambulatory Visit: Payer: Self-pay | Admitting: Internal Medicine

## 2018-12-16 DIAGNOSIS — K080 Exfoliation of teeth due to systemic causes: Secondary | ICD-10-CM | POA: Diagnosis not present

## 2019-03-07 DIAGNOSIS — R5383 Other fatigue: Secondary | ICD-10-CM | POA: Diagnosis not present

## 2019-03-07 DIAGNOSIS — E611 Iron deficiency: Secondary | ICD-10-CM | POA: Diagnosis not present

## 2019-03-07 DIAGNOSIS — R635 Abnormal weight gain: Secondary | ICD-10-CM | POA: Diagnosis not present

## 2019-03-07 DIAGNOSIS — E559 Vitamin D deficiency, unspecified: Secondary | ICD-10-CM | POA: Diagnosis not present

## 2019-03-07 DIAGNOSIS — R7309 Other abnormal glucose: Secondary | ICD-10-CM | POA: Diagnosis not present

## 2019-03-07 DIAGNOSIS — E782 Mixed hyperlipidemia: Secondary | ICD-10-CM | POA: Diagnosis not present

## 2019-03-07 DIAGNOSIS — N951 Menopausal and female climacteric states: Secondary | ICD-10-CM | POA: Diagnosis not present

## 2019-03-09 DIAGNOSIS — Z6841 Body Mass Index (BMI) 40.0 and over, adult: Secondary | ICD-10-CM | POA: Diagnosis not present

## 2019-03-09 DIAGNOSIS — E559 Vitamin D deficiency, unspecified: Secondary | ICD-10-CM | POA: Diagnosis not present

## 2019-03-09 DIAGNOSIS — Z1331 Encounter for screening for depression: Secondary | ICD-10-CM | POA: Diagnosis not present

## 2019-03-09 DIAGNOSIS — E8881 Metabolic syndrome: Secondary | ICD-10-CM | POA: Diagnosis not present

## 2019-03-09 DIAGNOSIS — E611 Iron deficiency: Secondary | ICD-10-CM | POA: Diagnosis not present

## 2019-03-09 DIAGNOSIS — Z1339 Encounter for screening examination for other mental health and behavioral disorders: Secondary | ICD-10-CM | POA: Diagnosis not present

## 2019-03-16 DIAGNOSIS — Z6841 Body Mass Index (BMI) 40.0 and over, adult: Secondary | ICD-10-CM | POA: Diagnosis not present

## 2019-03-16 DIAGNOSIS — E782 Mixed hyperlipidemia: Secondary | ICD-10-CM | POA: Diagnosis not present

## 2019-03-24 DIAGNOSIS — Z6841 Body Mass Index (BMI) 40.0 and over, adult: Secondary | ICD-10-CM | POA: Diagnosis not present

## 2019-03-24 DIAGNOSIS — R7309 Other abnormal glucose: Secondary | ICD-10-CM | POA: Diagnosis not present

## 2019-04-07 DIAGNOSIS — Z6841 Body Mass Index (BMI) 40.0 and over, adult: Secondary | ICD-10-CM | POA: Diagnosis not present

## 2019-04-07 DIAGNOSIS — E8881 Metabolic syndrome: Secondary | ICD-10-CM | POA: Diagnosis not present

## 2019-05-26 DIAGNOSIS — M5417 Radiculopathy, lumbosacral region: Secondary | ICD-10-CM | POA: Diagnosis not present

## 2019-05-26 DIAGNOSIS — M545 Low back pain: Secondary | ICD-10-CM | POA: Diagnosis not present

## 2019-05-31 ENCOUNTER — Other Ambulatory Visit: Payer: Self-pay | Admitting: Orthopedic Surgery

## 2019-05-31 DIAGNOSIS — M545 Low back pain, unspecified: Secondary | ICD-10-CM

## 2019-05-31 DIAGNOSIS — G8929 Other chronic pain: Secondary | ICD-10-CM

## 2019-06-06 ENCOUNTER — Ambulatory Visit
Admission: RE | Admit: 2019-06-06 | Discharge: 2019-06-06 | Disposition: A | Discharge status: Home / Self Care | Payer: Federal, State, Local not specified - PPO | Source: Ambulatory Visit | Attending: Orthopedic Surgery | Admitting: Orthopedic Surgery

## 2019-06-06 ENCOUNTER — Other Ambulatory Visit: Payer: Self-pay

## 2019-06-06 DIAGNOSIS — M5116 Intervertebral disc disorders with radiculopathy, lumbar region: Secondary | ICD-10-CM | POA: Diagnosis not present

## 2019-06-06 DIAGNOSIS — G8929 Other chronic pain: Secondary | ICD-10-CM

## 2019-06-06 MED ORDER — IOPAMIDOL (ISOVUE-M 200) INJECTION 41%
1.0000 mL | Freq: Once | INTRAMUSCULAR | Status: AC
  Administered 2019-06-06: 1 mL via EPIDURAL

## 2019-06-06 MED ORDER — METHYLPREDNISOLONE ACETATE 40 MG/ML INJ SUSP (RADIOLOG
120.0000 mg | Freq: Once | INTRAMUSCULAR | Status: AC
  Administered 2019-06-06: 120 mg via EPIDURAL

## 2019-06-06 NOTE — Discharge Instructions (Signed)

## 2019-06-14 DIAGNOSIS — M545 Low back pain: Secondary | ICD-10-CM | POA: Diagnosis not present

## 2019-06-14 DIAGNOSIS — M5417 Radiculopathy, lumbosacral region: Secondary | ICD-10-CM | POA: Diagnosis not present

## 2019-08-25 DIAGNOSIS — M25562 Pain in left knee: Secondary | ICD-10-CM | POA: Diagnosis not present

## 2019-08-25 DIAGNOSIS — M25561 Pain in right knee: Secondary | ICD-10-CM | POA: Diagnosis not present

## 2019-08-25 DIAGNOSIS — M545 Low back pain: Secondary | ICD-10-CM | POA: Diagnosis not present

## 2019-08-25 DIAGNOSIS — M170 Bilateral primary osteoarthritis of knee: Secondary | ICD-10-CM | POA: Diagnosis not present

## 2019-08-26 DIAGNOSIS — R638 Other symptoms and signs concerning food and fluid intake: Secondary | ICD-10-CM | POA: Insufficient documentation

## 2020-01-09 ENCOUNTER — Ambulatory Visit (INDEPENDENT_AMBULATORY_CARE_PROVIDER_SITE_OTHER): Payer: Federal, State, Local not specified - PPO | Admitting: Internal Medicine

## 2020-01-09 ENCOUNTER — Other Ambulatory Visit: Payer: Self-pay

## 2020-01-09 ENCOUNTER — Encounter: Payer: Self-pay | Admitting: Internal Medicine

## 2020-01-09 VITALS — BP 126/78 | HR 84 | Temp 98.7°F | Ht 61.0 in | Wt 322.0 lb

## 2020-01-09 DIAGNOSIS — E538 Deficiency of other specified B group vitamins: Secondary | ICD-10-CM

## 2020-01-09 DIAGNOSIS — M8949 Other hypertrophic osteoarthropathy, multiple sites: Secondary | ICD-10-CM

## 2020-01-09 DIAGNOSIS — I10 Essential (primary) hypertension: Secondary | ICD-10-CM

## 2020-01-09 DIAGNOSIS — M159 Polyosteoarthritis, unspecified: Secondary | ICD-10-CM

## 2020-01-09 MED ORDER — NYSTATIN-TRIAMCINOLONE 100000-0.1 UNIT/GM-% EX OINT: 1.0000 "application " | TOPICAL_OINTMENT | Freq: Two times a day (BID) | CUTANEOUS | 11 refills | Status: DC

## 2020-01-09 NOTE — Progress Notes (Signed)
   Subjective:   Patient ID: Christine Ramirez, female    DOB: Dec 25, 1965, 54 y.o.   MRN: [id number]  HPI The patient is a 54 YO female coming in for follow up obesity (weight is up significantly since pandemic, is having a lot more mobility and pain problems now, she is wanting to pursue weight loss surgery and has several forms to be filled out for that, did process but did not get surgery several years ago, she is feeling very down about her weight and overall level of functioning which is her big motivator at this time) and blood pressure (taking lisinopril and BP at goal, had monitoring with labs at [facility] trying to lose weight without success, denies headaches or chest pains, denies missing doses) and arthritis (this is getting unbearable, she has fallen about 7 times in the recent past, not feeling stable in the bathroom and needing assistance there now, weight is up significantly in the last several years).   Review of Systems  Constitutional: Positive for activity change and fatigue.  HENT: Negative.   Eyes: Negative.   Respiratory: Positive for shortness of breath. Negative for cough and chest tightness.   Cardiovascular: Negative for chest pain, palpitations and leg swelling.  Gastrointestinal: Negative for abdominal distention, abdominal pain, constipation, diarrhea, nausea and vomiting.  Musculoskeletal: Positive for arthralgias, back pain and gait problem.  Skin: Negative.   Neurological: Positive for weakness.  Psychiatric/Behavioral: Negative.     Objective:  Physical Exam Constitutional:      Appearance: She is well-developed. She is obese.  HENT:     Head: Normocephalic and atraumatic.  Cardiovascular:     Rate and Rhythm: Normal rate and regular rhythm.  Pulmonary:     Effort: Pulmonary effort is normal. No respiratory distress.     Breath sounds: Normal breath sounds. No wheezing or rales.  Abdominal:     General: Bowel sounds are normal. There is no  distension.     Palpations: Abdomen is soft.     Tenderness: There is no abdominal tenderness. There is no rebound.  Musculoskeletal:        General: Tenderness present.     Cervical back: Normal range of motion.  Skin:    General: Skin is warm and dry.  Neurological:     Mental Status: She is alert and oriented to person, place, and time.     Coordination: Coordination abnormal.     Comments: Slow gait, slow sit to stand time     Vitals:   01/09/20 1610  BP: 126/78  Pulse: 84  Temp: 98.7 F (37.1 C)  TempSrc: Oral  SpO2: 99%  Weight: (!) 322 lb (146.1 kg)  Height: 5\' 1"  (1.549 m)    This visit occurred during the SARS-CoV-2 public health emergency.  Safety protocols were in place, including screening questions prior to the visit, additional usage of staff PPE, and extensive cleaning of exam room while observing appropriate contact time as indicated for disinfecting solutions.   Assessment & Plan:

## 2020-01-09 NOTE — Patient Instructions (Signed)
We will check the labs today and get you in with Dr. Gaynelle Adu

## 2020-01-10 ENCOUNTER — Telehealth: Payer: Self-pay | Admitting: Internal Medicine

## 2020-01-10 ENCOUNTER — Encounter: Payer: Self-pay | Admitting: Internal Medicine

## 2020-01-10 NOTE — Telephone Encounter (Signed)
1.Medication Requested: lisinopril (ZESTRIL) 5 MG tablet  2. Pharmacy (Name, [address], [place]): [id number] - [place], [place] - [phone] [address] Phone:  [phone]  Fax:  [phone]      3. On Med List: yes  4. Last Visit with PCP: 11.15.2021  5. Next visit date with PCP: N/A  Patient states she also talked to Dr. Okey Dupre about getting medication for acid reflux yesterday and she agreed

## 2020-01-11 MED ORDER — LISINOPRIL 5 MG PO TABS: 5.0000 mg | ORAL_TABLET | Freq: Every day | ORAL | 3 refills | Status: DC

## 2020-01-12 NOTE — Assessment & Plan Note (Signed)
Worsening overall with loss of stability and increased moderate to high fall risk. She is using some otc medications for pain as needed. She is aware that she will likely not be able to get knee surgery until weight loss surgery. We talked about losing weight to help avoid further damage to joints.

## 2020-01-12 NOTE — Assessment & Plan Note (Signed)
BP at goal on lisinopril. Checking CMP and adjust as needed.  

## 2020-01-12 NOTE — Assessment & Plan Note (Signed)
Weight is up significantly and she is pursuing weight loss surgery with Gaynelle Adu. Forms filled out and faxed for her to assist with this process. She has tried many diets and diet plans without success of losing weight and now her mobility is poor and this is limiting her activity level.

## 2020-01-17 ENCOUNTER — Other Ambulatory Visit: Payer: Self-pay | Admitting: Internal Medicine

## 2020-01-17 NOTE — Telephone Encounter (Signed)
   Patient reports she has no medication remaining Requesting refill today

## 2020-01-18 ENCOUNTER — Other Ambulatory Visit (INDEPENDENT_AMBULATORY_CARE_PROVIDER_SITE_OTHER): Payer: Federal, State, Local not specified - PPO

## 2020-01-18 DIAGNOSIS — I10 Essential (primary) hypertension: Secondary | ICD-10-CM | POA: Diagnosis not present

## 2020-01-18 DIAGNOSIS — E538 Deficiency of other specified B group vitamins: Secondary | ICD-10-CM

## 2020-01-18 LAB — LIPID PANEL
Cholesterol: 209 mg/dL — ABNORMAL HIGH (ref 0–200)
HDL: 51.2 mg/dL (ref >39.00)
LDL Cholesterol: 132 mg/dL — ABNORMAL HIGH (ref 0–99)
NonHDL: 157.51
Total CHOL/HDL Ratio: 4
Triglycerides: 130 mg/dL (ref 0.0–149.0)
VLDL: 26 mg/dL (ref 0.0–40.0)

## 2020-01-18 LAB — CBC WITH DIFFERENTIAL/PLATELET
Basophils Absolute: 0.1 10*3/uL (ref 0.0–0.1)
Basophils Relative: 1.3 % (ref 0.0–3.0)
Eosinophils Absolute: 0.2 10*3/uL (ref 0.0–0.7)
Eosinophils Relative: 1.9 % (ref 0.0–5.0)
HCT: 38.2 % (ref 36.0–46.0)
Hemoglobin: 12.4 g/dL (ref 12.0–15.0)
Lymphocytes Relative: 25.8 % (ref 12.0–46.0)
Lymphs Abs: 2.7 10*3/uL (ref 0.7–4.0)
MCHC: 32.5 g/dL (ref 30.0–36.0)
MCV: 86.6 fl (ref 78.0–100.0)
Monocytes Absolute: 0.7 10*3/uL (ref 0.1–1.0)
Monocytes Relative: 6.3 % (ref 3.0–12.0)
Neutro Abs: 6.8 10*3/uL (ref 1.4–7.7)
Neutrophils Relative %: 64.7 % (ref 43.0–77.0)
Platelets: 343 10*3/uL (ref 150.0–400.0)
RBC: 4.41 Mil/uL (ref 3.87–5.11)
RDW: 16.3 % — ABNORMAL HIGH (ref 11.5–15.5)
WBC: 10.6 10*3/uL — ABNORMAL HIGH (ref 4.0–10.5)

## 2020-01-18 LAB — COMPREHENSIVE METABOLIC PANEL
ALT: 16 U/L (ref 0–35)
AST: 14 U/L (ref 0–37)
Albumin: 4.1 g/dL (ref 3.5–5.2)
Alkaline Phosphatase: 60 U/L (ref 39–117)
BUN: 10 mg/dL (ref 6–23)
CO2: 26 mEq/L (ref 19–32)
Calcium: 9.2 mg/dL (ref 8.4–10.5)
Chloride: 100 mEq/L (ref 96–112)
Creatinine, Ser: 0.85 mg/dL (ref 0.40–1.20)
GFR: 77.26 mL/min (ref >60.00)
Glucose, Bld: 106 mg/dL — ABNORMAL HIGH (ref 70–99)
Potassium: 4 mEq/L (ref 3.5–5.1)
Sodium: 136 mEq/L (ref 135–145)
Total Bilirubin: 0.6 mg/dL (ref 0.2–1.2)
Total Protein: 7.5 g/dL (ref 6.0–8.3)

## 2020-01-18 LAB — HEMOGLOBIN A1C: Hgb A1c MFr Bld: 5.9 % (ref 4.6–6.5)

## 2020-01-18 LAB — VITAMIN B12: Vitamin B-12: 206 pg/mL — ABNORMAL LOW (ref 211–911)

## 2020-02-29 ENCOUNTER — Ambulatory Visit: Payer: Self-pay | Admitting: *Deleted

## 2020-02-29 NOTE — Telephone Encounter (Signed)
Patient seeking testing Covid testing appointment for today. No availability with [facility] sites. Provided patient with other location sites and #/web sites. Advised remaining in quarantine due to having symptoms, until her results are available. OTC medications for discomfort. Discussed isolation period if positive.   Reason for Disposition . Health Information question, no triage required and triager able to answer question  Answer Assessment - Initial Assessment Questions 1. REASON FOR CALL or QUESTION: "What is your reason for calling today?" or "How can I best help you?" or "What question do you have that I can help answer?"    Seeking Covid testing appointment  Protocols used: INFORMATION ONLY CALL - NO TRIAGE-A-AH

## 2020-03-02 ENCOUNTER — Other Ambulatory Visit: Payer: Federal, State, Local not specified - PPO

## 2020-03-02 DIAGNOSIS — Z20822 Contact with and (suspected) exposure to covid-19: Secondary | ICD-10-CM

## 2020-03-06 LAB — NOVEL CORONAVIRUS, NAA: SARS-CoV-2, NAA: DETECTED — AB

## 2020-03-07 ENCOUNTER — Telehealth: Payer: Self-pay | Admitting: Internal Medicine

## 2020-03-07 NOTE — Telephone Encounter (Signed)
   Patient calling to report she tested positive for COVID Virtual visit scheduled for 01/13

## 2020-03-07 NOTE — Telephone Encounter (Signed)
Until then can quarantine for 5 days from onset of symptoms and can use otc cold medications.

## 2020-03-07 NOTE — Telephone Encounter (Signed)
Pt informed of below.  

## 2020-03-08 ENCOUNTER — Encounter: Payer: Self-pay | Admitting: Internal Medicine

## 2020-03-08 ENCOUNTER — Telehealth (INDEPENDENT_AMBULATORY_CARE_PROVIDER_SITE_OTHER): Payer: Federal, State, Local not specified - PPO | Admitting: Internal Medicine

## 2020-03-08 ENCOUNTER — Other Ambulatory Visit: Payer: Self-pay

## 2020-03-08 DIAGNOSIS — U071 COVID-19: Secondary | ICD-10-CM

## 2020-03-08 MED ORDER — PROMETHAZINE-DM 6.25-15 MG/5ML PO SYRP: 5.0000 mL | ORAL_SOLUTION | Freq: Four times a day (QID) | ORAL | 0 refills | Status: DC | PRN

## 2020-03-08 MED ORDER — ONDANSETRON HCL 4 MG PO TABS: 4.0000 mg | ORAL_TABLET | Freq: Three times a day (TID) | ORAL | 1 refills | Status: DC | PRN

## 2020-03-08 MED ORDER — PREDNISONE 20 MG PO TABS: 40.0000 mg | ORAL_TABLET | Freq: Every day | ORAL | 0 refills | Status: DC

## 2020-03-08 NOTE — Progress Notes (Signed)
Virtual Visit via Video Note  I connected with Christine Ramirez on 03/08/20 at  2:40 PM EST by a video enabled telemedicine application and verified that I am speaking with the correct person using two identifiers.  The patient and the provider were at separate locations throughout the entire encounter. Patient location: home, Provider location: work   I discussed the limitations of evaluation and management by telemedicine and the availability of in person appointments. The patient expressed understanding and agreed to proceed. The patient and the provider were the only parties present for the visit unless noted in HPI below.  History of Present Illness: The patient is a 55 y.o. female with visit for covid-19 with congestion and fever and diarrhea. Started 02/28/20 after a wedding over New Year's. Afterwards whole bridal party tested positive. Has diarrhea and nausea and poor appetite. Is taking imodium at least 10 which has not helped much. Is not vaccinated. Denies SOB. Overall it is not improving tons.   Observations/Objective: Appearance: normal, some coughing, breathing appears normal, casual grooming, abdomen does no appear distended, throat not well visualized, mental status is A and O times 3  Assessment and Plan: See problem oriented charting  Follow Up Instructions: rx prednisone and promethazine/dm, and zofran  I discussed the assessment and treatment plan with the patient. The patient was provided an opportunity to ask questions and all were answered. The patient agreed with the plan and demonstrated an understanding of the instructions.   The patient was advised to call back or seek an in-person evaluation if the symptoms worsen or if the condition fails to improve as anticipated.  Myrlene Broker, MD

## 2020-03-09 DIAGNOSIS — U071 COVID-19: Secondary | ICD-10-CM | POA: Insufficient documentation

## 2020-03-09 NOTE — Assessment & Plan Note (Signed)
Rx prednisone and zofran and promethazine/dm. Outside window for monoclonal antibody and advised she is outside the quarantine window. Advised on what symptoms to call back or seek care in urgent care/ ER.

## 2020-03-14 ENCOUNTER — Telehealth: Payer: Self-pay | Admitting: Internal Medicine

## 2020-03-14 NOTE — Telephone Encounter (Signed)
Patient is requesting a call back from Dr. Frutoso Chase nurse. She can be reached at [phone].

## 2020-03-15 NOTE — Telephone Encounter (Signed)
Spoke with the patient and she stated that she still feels horrible dealing with covid. She also mentioned that she was working from home since Tuesday, she is requesting a note to be out until Monday. She also mentioned that she has been bleeding as if she is having a period, she did note where she had a incident where she noticed some blood splattered from her belly button. No blood from her belly button since the incident. Patient is wanting to know what she should do. Please advise

## 2020-03-16 NOTE — Telephone Encounter (Signed)
LVM with the information listed below. Office number was provided in case she has any additional questions or concerns.

## 2020-03-16 NOTE — Telephone Encounter (Signed)
Work note done on [employer]. Can monitor bleeding and should make a visit with her ob/gyn to assess.

## 2020-03-26 ENCOUNTER — Other Ambulatory Visit: Payer: Self-pay | Admitting: Obstetrics and Gynecology

## 2020-03-26 DIAGNOSIS — Z1231 Encounter for screening mammogram for malignant neoplasm of breast: Secondary | ICD-10-CM

## 2020-03-30 ENCOUNTER — Telehealth: Payer: Self-pay | Admitting: *Deleted

## 2020-03-30 DIAGNOSIS — R7303 Prediabetes: Secondary | ICD-10-CM | POA: Diagnosis not present

## 2020-03-30 DIAGNOSIS — K760 Fatty (change of) liver, not elsewhere classified: Secondary | ICD-10-CM | POA: Diagnosis not present

## 2020-03-30 DIAGNOSIS — E538 Deficiency of other specified B group vitamins: Secondary | ICD-10-CM | POA: Diagnosis not present

## 2020-03-30 NOTE — Telephone Encounter (Signed)
   [facility] Pre-operative Risk Assessment    HEARTCARE STAFF: - Please ensure there is not already an duplicate clearance open for this procedure. - Under Visit Info/Reason for Call, type in Other and utilize the format Clearance MM/DD/YY or Clearance TBD. Do not use dashes or single digits. - If request is for dental extraction, please clarify the # of teeth to be extracted.  Request for surgical clearance: PT WAS LAST SEEN BY CARDIOLOGY 2017 AND WILL NEED A NEW PT APPT. NOTE SENT TO SCHEDULING TO SET UP NEW PT APPT; NOTES SENT FROM REQUESTING OFFICE HAS BEEN HANDED OVER TO CHART PREP TEAM (ROSE JACOBS, CMA)  1. What type of surgery is being performed? GASTRIC BYPASS SURGERY   2. When is this surgery scheduled? TBD   3. What type of clearance is required (medical clearance vs. Pharmacy clearance to hold med vs. Both)? MEDICAL   4. Are there any medications that need to be held prior to surgery and how long? NONE LISTED   5. Practice name and name of physician performing surgery? [facility]; DR. ERIC WILSON   6. What is the office phone number? [phone]   7.   What is the office fax number? [facility]: Lindwood Coke, RN  8.   Anesthesia type (None, local, MAC, general) ? GENERAL   Julaine Hua 03/30/2020, 2:14 PM  _________________________________________________________________   (provider comments below)

## 2020-04-02 ENCOUNTER — Telehealth (INDEPENDENT_AMBULATORY_CARE_PROVIDER_SITE_OTHER): Payer: Federal, State, Local not specified - PPO | Admitting: Internal Medicine

## 2020-04-02 ENCOUNTER — Encounter: Payer: Self-pay | Admitting: Internal Medicine

## 2020-04-02 DIAGNOSIS — U071 COVID-19: Secondary | ICD-10-CM | POA: Diagnosis not present

## 2020-04-02 MED ORDER — PROMETHAZINE-DM 6.25-15 MG/5ML PO SYRP: 5.0000 mL | ORAL_SOLUTION | Freq: Four times a day (QID) | ORAL | 0 refills | Status: DC | PRN

## 2020-04-02 MED ORDER — AMOXICILLIN-POT CLAVULANATE 875-125 MG PO TABS: 1.0000 | ORAL_TABLET | Freq: Two times a day (BID) | ORAL | 0 refills | Status: AC

## 2020-04-02 NOTE — Assessment & Plan Note (Signed)
Still with ongoing cough and now possible ear infection. Rx 1 week augmentin and refill promethazine/dm cough syrup. She is overall improving but very slowly.

## 2020-04-02 NOTE — Progress Notes (Signed)
Virtual Visit via Video Note  I connected with Christine Ramirez on 04/02/20 at  3:00 PM EST by a video enabled telemedicine application and verified that I am speaking with the correct person using two identifiers.  The patient and the provider were at separate locations throughout the entire encounter. Patient location: home, Provider location: work   I discussed the limitations of evaluation and management by telemedicine and the availability of in person appointments. The patient expressed understanding and agreed to proceed. The patient and the provider were the only parties present for the visit unless noted in HPI below.  History of Present Illness: The patient is a 55 y.o. female with visit for continuing problems from covid-19 started 02/28/20. Has had some improvement in cough but still coughing mostly non-productive. Denies fevers or chills. Having severe right ear pain in the last week or so. Sinus pressure is also increasing recently. Still some SOB on exertion. Having severe fatigue. Is back to work but struggling getting through the days. Overall it is improving but very gradually. Has tried promethazine/dm cough syrup and this did help but she is out now. She was able to have visit with surgeon and will be pursuing bypass surgery.  Observations/Objective: Appearance: appears tired, breathing appears normal, some coughing during visit, casual grooming, abdomen does not appear distended, throat not well visualized, pain in the frontal sinuses to self palpation, mental status is A and O times 3  Assessment and Plan: See problem oriented charting  Follow Up Instructions: work note given, rx 1 week augmentin for possible ear/sinus infection, refill promethazine/dm cough syrup for ongoing covid symptoms  I discussed the assessment and treatment plan with the patient. The patient was provided an opportunity to ask questions and all were answered. The patient agreed with the plan and  demonstrated an understanding of the instructions.   The patient was advised to call back or seek an in-person evaluation if the symptoms worsen or if the condition fails to improve as anticipated.  Myrlene Broker, MD

## 2020-04-03 ENCOUNTER — Telehealth: Payer: Self-pay | Admitting: Internal Medicine

## 2020-04-03 NOTE — Telephone Encounter (Signed)
Fine to extend note work.

## 2020-04-03 NOTE — Telephone Encounter (Signed)
See below

## 2020-04-03 NOTE — Telephone Encounter (Signed)
Patient was notified via mychart that her letter has been updated.

## 2020-04-03 NOTE — Telephone Encounter (Signed)
Patient called and was wondering if her work note could be extended for today as well. She said she is not feeling any better. Please advise

## 2020-04-09 ENCOUNTER — Telehealth: Payer: Self-pay | Admitting: Internal Medicine

## 2020-04-09 ENCOUNTER — Ambulatory Visit: Payer: Federal, State, Local not specified - PPO | Admitting: Internal Medicine

## 2020-04-09 MED ORDER — FLUCONAZOLE 150 MG PO TABS: 150.0000 mg | ORAL_TABLET | ORAL | 0 refills | Status: AC

## 2020-04-09 NOTE — Telephone Encounter (Signed)
Patient was taking an antibiotic for her ear and it has helped but now she has a yeast infection, patient is wondering if Dr. Okey Dupre could send something in for it. States she still has some left from her last yeast infection but not enough. Denies appointment at this time because she has already been treated for it before.

## 2020-04-09 NOTE — Telephone Encounter (Signed)
See below

## 2020-04-09 NOTE — Telephone Encounter (Signed)
Sent in diflucan

## 2020-04-13 NOTE — Telephone Encounter (Signed)
    Patient requesting work note be updated to reflect her returning to work 2/9

## 2020-04-20 ENCOUNTER — Ambulatory Visit: Payer: Federal, State, Local not specified - PPO | Admitting: Internal Medicine

## 2020-04-20 NOTE — Progress Notes (Deleted)
Cardiology Office Note:    Date:  04/20/2020   ID:  Christine Ramirez, DOB 1965-06-21, MRN [id number]  PCP:  Myrlene Broker, MD   [facility]  Cardiologist:  No primary care provider on file.  [place] Dr. Elease Hashimoto 2017 Advanced Practice Provider:  No care team member to display Electrophysiologist:  None  {Press F2 to show EP APP, CHF, sleep or structural heart MD               :[id number]}  { Click here to update then REFRESH NOTE - MD (PCP) or APP (Team Member)  Change PCP Type for MD, Specialty for APP is either Cardiology or Clinical Cardiac Electrophysiology  :[id number]}   CC: *** Consulted for the evaluation of risk stratification for surgery at the behest of Myrlene Broker, MD   History of Present Illness:    Christine Ramirez is a 55 y.o. female with a hx of HTN, HLD w NAFLD, COPD, and Morbid Obesity who presents for evaluation 04/20/20.  Patient notes that she is feeling ***.  Has had no chest pain, chest pressure, chest tightness, chest stinging ***.  Discomfort occurs with ***, worsens with ***, and improves with ***.  Patient exertion notable for *** with *** and feels no symptoms.  No shortness of breath, DOE ***.  No PND or orthopnea***.  No bendopnea***, weight gain***, leg swelling ***, or abdominal swelling***.  No syncope or near syncope ***. Notes *** no palpitations or funny heart beats.     Patient reports prior cardiac testing including *** echo, *** stress test, *** heart catheterizations, *** cardioversion, *** ablations.  No history of ***pre-eclampsia, early menarche, or prematurity.  No Fen-Phen or drug use***.  Ambulatory BP ***.   Past Medical History:  Diagnosis Date  . Anemia   . Anxiety   . COPD (chronic obstructive pulmonary disease) (HCC)   . Depression   . Fatty liver   . GERD (gastroesophageal reflux disease)   . H/O typhoid fever    age 15  . Headache   . Heart murmur    as a child  . Hepatitis     age 6  . Hyperlipidemia   . Hypertension   . Jaundice    age 23  . Pneumonia    in past  . PONV (postoperative nausea and vomiting)   . Scarlet fever    age 16  . Shortness of breath dyspnea     Past Surgical History:  Procedure Laterality Date  . ABDOMINAL EXPLORATION SURGERY  1975   age 54 at [facility] procedure  . APPENDECTOMY    . CHOLECYSTECTOMY N/A 03/19/2015   Procedure: LAPAROSCOPIC CHOLECYSTECTOMY WITH INTRAOPERATIVE CHOLANGIOGRAM;  Surgeon: Gaynelle Adu, MD;  Location: [facility];  Service: General;  Laterality: N/A;  . CHOLECYSTECTOMY    . COLONOSCOPY WITH PROPOFOL N/A 07/24/2016   Procedure: COLONOSCOPY WITH PROPOFOL;  Surgeon: Charlott Rakes, MD;  Location[facility] END[facility]COPY;  Service: Endoscopy;  Laterality: N/A;  . DILATION AND CURETTAGE OF UTERUS  2000  . KNEE SURGERY Right     Current Medications: No outpatient medications have been marked as taking for the 04/20/20 encounter (Appointment) with Christell Constant, MD.     Allergies:   Patient has no known allergies.   Social History   Socioeconomic History  . Marital status: Married    Spouse name: Not on file  . Number of children: Not on file  . Years of education: Not  on file  . Highest education level: Not on file  Occupational History  . Not on file  Tobacco Use  . Smoking status: Never Smoker  . Smokeless tobacco: Never Used  Vaping Use  . Vaping Use: Never used  Substance and Sexual Activity  . Alcohol use: Yes    Alcohol/week: 0.0 standard drinks    Comment: rarely  . Drug use: No  . Sexual activity: Not on file  Other Topics Concern  . Not on file  Social History Narrative  . Not on file   Social Determinants of Health   Financial Resource Strain: Not on file  Food Insecurity: Not on file  Transportation Needs: Not on file  Physical Activity: Not on file  Stress: Not on file  Social Connections: Not on file     Family History: The patient's family history includes  Alcohol abuse in her paternal grandfather; Arthritis in her father; Cancer in her maternal grandfather, maternal grandmother, and mother; Depression in her paternal grandmother; Heart disease in her maternal grandfather and maternal grandmother; Melanoma in her mother. History of coronary artery disease notable for ***. History of heart failure notable for ***. No history of cardiomyopathies including hypertrophic cardiomyopathy, left ventricular non-compaction, or arrhythmogenic right ventricular cardiomyopathy.*** History of arrhythmia notable for ***. Denies family history of sudden cardiac death including drowning, car accidents, or unexplained deaths in the family.*** No history of bicuspid aortic valve or aortic aneurysm or dissection.   ROS:   Please see the history of present illness.    *** All other systems reviewed and are negative.  EKGs/Labs/Other Studies Reviewed:    The following studies were reviewed today:  EKG:  EKG is *** ordered today.  The ekg ordered today demonstrates ***  Recent Labs: 01/18/2020: ALT 16; BUN 10; Creatinine, Ser 0.85; Hemoglobin 12.4; Platelets 343.0; Potassium 4.0; Sodium 136  Recent Lipid Panel    Component Value Date/Time   CHOL 209 (H) 01/18/2020 1614   TRIG 130.0 01/18/2020 1614   HDL 51.20 01/18/2020 1614   CHOLHDL 4 01/18/2020 1614   VLDL 26.0 01/18/2020 1614   LDLCALC 132 (H) 01/18/2020 1614     Risk Assessment/Calculations:     The 10-year ASCVD risk score Denman George DC Montez Hageman., et al., 2013) is: 2.9%   Values used to calculate the score:     Age: 74 years     Sex: Female     Is Non-Hispanic African American: No     Diabetic: No     Tobacco smoker: No     Systolic Blood Pressure: 126 mmHg     Is BP treated: Yes     HDL Cholesterol: 51.2 mg/dL     Total Cholesterol: 209 mg/dL   Physical Exam:    VS:  LMP 07/23/2016 (Exact Date)     Wt Readings from Last 3 Encounters:  01/09/20 (!) 322 lb (146.1 kg)  07/08/17 290 lb (131.5  kg)  07/24/16 287 lb (130.2 kg)     GEN: *** Well nourished, well developed in no acute distress HEENT: Normal NECK: No JVD; No carotid bruits LYMPHATICS: No lymphadenopathy CARDIAC: ***RRR, no murmurs, rubs, gallops RESPIRATORY:  Clear to auscultation without rales, wheezing or rhonchi  ABDOMEN: Soft, non-tender, non-distended MUSCULOSKELETAL:  No edema; No deformity  SKIN: Warm and dry NEUROLOGIC:  Alert and oriented x 3 PSYCHIATRIC:  Normal affect   ASSESSMENT:    No diagnosis found. PLAN:    In order of problems listed above:  Preoperative Risk  Assessment - The Revised Cardiac Risk Index = ***(high risk surgery (intraperitoneal, intrathoracic, or suprainguinal vascular), CAD, CHF, CVA, DM on insulin, Scr >2), which equates to *** (0=0.4%: very low risk; 1=0.9%: low risk; 2=6.6%: moderate risk; >2=>11%; high risk) estimated risk of perioperative myocardial infarction, pulmonary edema, ventricular fibrillation, cardiac arrest, or complete heart block.  - DASI score of *** associated with *** functional mets - No further cardiac testing is recommended prior to surgery.  - The patient may proceed to surgery at acceptable risk.   - Due to symptoms of ***, an echocardiogram is recommended prior to proceeding with their planned procedure.  - Due to symptoms of ***, a stress test is recommended prior to proceeding with their planned procedure.  - Our service is available as needed in the peri-operative period.    *** Essential Hypertension, Hypertension with Diabetes, Obesity/DM/HTN - ambulatory blood pressure ***, will start/continue ambulatory BP monitoring; gave education on how to perform ambulatory blood pressure monitoring including the frequency and technique; goal ambulatory blood pressure < 135/85 on average - continue home medications with the exception of *** - will get labs in 7-10 days (BMET, Mg***) - OSA Risk ***, Epworth Sleepiness Scale great than *** - concern for  secondary HTN, will order renal artery duplex, and Aldo/renin***  - Arm/Leg BP Differential < 20 mm Hg; not suggestive of coaracation; Arm BP differential < 15 mm Hg not suggestive of subclavian stenosis - discussed diet (DASH/low sodium), and exercise/weight loss interventions ***  Hyperlipidemia (familial/mixed/HLD with DM) -LDL goal less than ***70/100 -continue current statin - Zetia vs Repatha*** - Bempedoic acid 180 mg PO daily *** tendon rupture - Fasting triglycerides notable for - Vascepa*** - low threshold to send to lipid clinic for additional patient support *** - would stop niacin as it can raise blood sugars and increase uric acid, and has not been shown to lower the risk of cardiovascular mortality, nonfatal myocardial infarction, stroke, or the need for revascularization. - Shared Decision Making: discussed the  OTC fish oil has little cardiovascular protection and all cause mortality, lack of FDA regulation and thus may contain some harmful fats; patient would like to *** -Recheck lipid profile LFTs - gave education on dietary changes    {Are you ordering a CV Procedure (e.g. stress test, cath, DCCV, TEE, etc)?   Press F2        :[id number]}    Medication Adjustments/Labs and Tests Ordered: Current medicines are reviewed at length with the patient today.  Concerns regarding medicines are outlined above.  No orders of the defined types were placed in this encounter.  No orders of the defined types were placed in this encounter.   There are no Patient Instructions on file for this visit.   Signed, Christell Constant, MD  04/20/2020 10:36 AM    [facility]

## 2020-05-07 ENCOUNTER — Encounter: Payer: Federal, State, Local not specified - PPO | Attending: General Surgery | Admitting: Skilled Nursing Facility1

## 2020-05-07 ENCOUNTER — Other Ambulatory Visit: Payer: Self-pay

## 2020-05-07 ENCOUNTER — Encounter: Payer: Self-pay | Admitting: Skilled Nursing Facility1

## 2020-05-07 NOTE — Progress Notes (Signed)
Nutrition Assessment for Bariatric Surgery Medical Nutrition Therapy Appt Start Time: 7:33    End Time: 8:33  Patient was seen on 05/07/2020 for Pre-Operative Nutrition Assessment. Letter of approval faxed to [facility] bariatric surgery program coordinator on 05/07/2020  Referral stated Supervised Weight Loss (SWL) visits needed: 3  Planned surgery: RYGB Pt expectation of surgery: to lose weight and stop overeating Pt expectation of dietitian: to help educate    NUTRITION ASSESSMENT   Anthropometrics  Start weight at [facility]: 318.5 lbs (date: 05/07/2020)  Height: 61 in BMI: 60.18 kg/m2     Clinical  Medical hx: Morbid Obesity, Anemia, COPD, Hyperlipidemia, Hypertension, GERD, depression/anxiety Medications: lisinopril Labs:  Notable signs/symptoms: stated limited mobility  Any previous deficiencies? No  Micronutrient Nutrition Focused Physical Exam: Hair: No issues observed Eyes: No issues observed Mouth: No issues observed Neck: No issues observed Nails: No issues observed Skin: No issues observed  Lifestyle & Dietary Hx  Pt is acutely aware it will take long term change to be successful after surgery. Pt states he does not drink water. Pt states she is really proud she has completely cut out soda for years now. Pt states she eats at least 1 meal a day out.   24-Hr Dietary Recall First Meal: bagel or cereal Snack:  Second Meal: soup + sandwich Snack:  Third Meal: eaten out Snack:  Beverages: sweet tea + truvia    Estimated Energy Needs Calories: 1500   NUTRITION DIAGNOSIS  Overweight/obesity ([place]-3.3) related to past poor dietary habits and physical inactivity as evidenced by patient w/ planned RYGB surgery following dietary guidelines for continued weight loss.    NUTRITION INTERVENTION  Nutrition counseling (C-1) and education (E-2) to facilitate bariatric surgery goals.   Pre-Op Goals Reviewed with the Patient . Track food and beverage intake  (pen and paper, MyFitness Pal, Baritastic app, etc.) . Make healthy food choices while monitoring portion sizes . Consume 3 meals per day or try to eat every 3-5 hours . Avoid concentrated sugars and fried foods . Keep sugar & fat in the single digits per serving on food labels . Practice CHEWING your food (aim for applesauce consistency) . Practice not drinking 15 minutes before, during, and 30 minutes after each meal and snack . Avoid all carbonated beverages (ex: soda, sparkling beverages)  . Limit caffeinated beverages (ex: coffee, tea, energy drinks) . Avoid all sugar-sweetened beverages (ex: regular soda, sports drinks)  . Avoid alcohol  . Aim for 64-100 ounces of FLUID daily (with at least half of fluid intake being plain water)  . Aim for at least 60-80 grams of PROTEIN daily . Look for a liquid protein source that contains ?15 g protein and ?5 g carbohydrate (ex: shakes, drinks, shots) . Make a list of non-food related activities . Physical activity is an important part of a healthy lifestyle so keep it moving! The goal is to reach 150 minutes of exercise per week, including cardiovascular and weight baring activity. . Stay in the moment with your meals . Work on your activity: dance party 2 times a day 5 minutes at a time  *Goals that are bolded indicate the pt would like to start working towards these  Handouts Provided Include  Bariatric Surgery handouts (Nutrition Visits, Pre-Op Goals, Protein Shakes, Vitamins & Minerals) -should I eat and mindful meals  Learning Style & Readiness for Change Teaching method utilized: Visual & Auditory  Demonstrated degree of understanding via: Teach Back  Readiness Level: pre contemplative  Barriers to learning/adherence to lifestyle change: anxiety  RD's Notes for Next Visit . Assess pts adherence to chosen goals      MONITORING & EVALUATION Dietary intake, weekly physical activity, body weight, and pre-op goals reached at next  nutrition visit.    Next Steps  Patient is to follow up at [facility] for Pre-Op Class >2 weeks before surgery for further nutrition education.

## 2020-05-11 ENCOUNTER — Inpatient Hospital Stay: Admission: RE | Admit: 2020-05-11 | Payer: Federal, State, Local not specified - PPO | Source: Ambulatory Visit

## 2020-05-14 ENCOUNTER — Ambulatory Visit (INDEPENDENT_AMBULATORY_CARE_PROVIDER_SITE_OTHER): Payer: Federal, State, Local not specified - PPO | Admitting: Internal Medicine

## 2020-05-14 ENCOUNTER — Ambulatory Visit: Payer: Federal, State, Local not specified - PPO | Attending: General Surgery

## 2020-05-14 ENCOUNTER — Encounter: Payer: Self-pay | Admitting: Internal Medicine

## 2020-05-14 ENCOUNTER — Other Ambulatory Visit: Payer: Self-pay

## 2020-05-14 VITALS — BP 140/70 | HR 89 | Ht 61.0 in | Wt 313.4 lb

## 2020-05-14 DIAGNOSIS — E782 Mixed hyperlipidemia: Secondary | ICD-10-CM | POA: Diagnosis not present

## 2020-05-14 DIAGNOSIS — R2689 Other abnormalities of gait and mobility: Secondary | ICD-10-CM

## 2020-05-14 DIAGNOSIS — M6281 Muscle weakness (generalized): Secondary | ICD-10-CM | POA: Diagnosis not present

## 2020-05-14 DIAGNOSIS — M25561 Pain in right knee: Secondary | ICD-10-CM | POA: Diagnosis not present

## 2020-05-14 DIAGNOSIS — R011 Cardiac murmur, unspecified: Secondary | ICD-10-CM | POA: Diagnosis not present

## 2020-05-14 DIAGNOSIS — M25562 Pain in left knee: Secondary | ICD-10-CM | POA: Diagnosis not present

## 2020-05-14 DIAGNOSIS — I10 Essential (primary) hypertension: Secondary | ICD-10-CM

## 2020-05-14 DIAGNOSIS — G8929 Other chronic pain: Secondary | ICD-10-CM | POA: Diagnosis not present

## 2020-05-14 NOTE — Patient Instructions (Signed)
Medication Instructions:  Your physician recommends that you continue on your current medications as directed. Please refer to the Current Medication list given to you today.  *If you need a refill on your cardiac medications before your next appointment, please call your pharmacy*   Lab Work: NONE If you have labs (blood work) drawn today and your tests are completely normal, you will receive your results only by: Marland Kitchen MyChart Message (if you have MyChart) OR . A paper copy in the mail If you have any lab test that is abnormal or we need to change your treatment, we will call you to review the results.   Testing/Procedures: Your physician has requested that you have an echocardiogram. Echocardiography is a painless test that uses sound waves to create images of your heart. It provides your doctor with information about the size and shape of your heart and how well your heart's chambers and valves are working. This procedure takes approximately one hour. There are no restrictions for this procedure.     Follow-Up: At [facility], you and your health needs are our priority.  As part of our continuing mission to provide you with exceptional heart care, we have created designated Provider Care Teams.  These Care Teams include your primary Cardiologist (physician) and Advanced Practice Providers (APPs -  Physician Assistants and Nurse Practitioners) who all work together to provide you with the care you need, when you need it.  We recommend signing up for the patient portal called "MyChart".  Sign up information is provided on this After Visit Summary.  MyChart is used to connect with patients for Virtual Visits (Telemedicine).  Patients are able to view lab/test results, encounter notes, upcoming appointments, etc.  Non-urgent messages can be sent to your provider as well.   To learn more about what you can do with MyChart, go to [email or web address].    Your next appointment:   7-8  month(s)  The format for your next appointment:   In Person  Provider:   You may see Christell Constant, MD or one of the following Advanced Practice Providers on your designated Care Team:    Ronie Spies, PA-C  Jacolyn Reedy, PA-C

## 2020-05-14 NOTE — Patient Instructions (Signed)
Access Code: [email or web address] URL: [email or web address] Date: 05/14/2020 Prepared by: Tresa Endo  Exercises Seated Hamstring Stretch - 2-3 x daily - 7 x weekly - 1 sets - 3 reps - 20 hold Sit to Stand Without Arm Support - 2 x daily - 7 x weekly - 2 sets - 5-10 reps Seated March - 3 x daily - 7 x weekly - 10 reps - 3 sets Seated Heel Toe Raises - 3 x daily - 7 x weekly - 10 reps - 2 sets

## 2020-05-14 NOTE — Therapy (Signed)
[facility] [address] [place], [place], [address] Phone: [phone]   Fax:  [phone]  Physical Therapy Evaluation  Patient Details  Name: Christine Ramirez MRN: [id number] Date of Birth: Jan 21, 1966 Referring Provider (PT): Gaynelle Adu, MD   Encounter Date: 05/14/2020   PT End of Session - 05/14/20 1705    Visit Number 1    Date for PT Re-Evaluation 07/09/20    Authorization Type [employer] Federal    Authorization - Visit Number 1    Authorization - Number of Visits 50    PT Start Time 1629   late   PT Stop Time 1705    PT Time Calculation (min) 36 min    Activity Tolerance Patient tolerated treatment well    Behavior During Therapy [facility] for tasks assessed/performed           Past Medical History:  Diagnosis Date  . Anemia   . Anxiety   . COPD (chronic obstructive pulmonary disease) (HCC)   . Depression   . Fatty liver   . GERD (gastroesophageal reflux disease)   . H/O typhoid fever    age 89  . Headache   . Heart murmur    as a child  . Hepatitis    age 89  . Hyperlipidemia   . Hypertension   . Jaundice    age 55  . Pneumonia    in past  . PONV (postoperative nausea and vomiting)   . Scarlet fever    age 89  . Shortness of breath dyspnea     Past Surgical History:  Procedure Laterality Date  . ABDOMINAL EXPLORATION SURGERY  1975   age 89 at [facility] procedure  . APPENDECTOMY    . CHOLECYSTECTOMY N/A 03/19/2015   Procedure: LAPAROSCOPIC CHOLECYSTECTOMY WITH INTRAOPERATIVE CHOLANGIOGRAM;  Surgeon: Gaynelle Adu, MD;  Location: [facility];  Service: General;  Laterality: N/A;  . CHOLECYSTECTOMY    . COLONOSCOPY WITH PROPOFOL N/A 07/24/2016   Procedure: COLONOSCOPY WITH PROPOFOL;  Surgeon: Charlott Rakes, MD;  Location: [facility] ENDOSCOPY;  Service: Endoscopy;  Laterality: N/A;  . DILATION AND CURETTAGE OF UTERUS  2000  . KNEE SURGERY Right     There were no vitals filed for this  visit.    Subjective Assessment - 05/14/20 1627    Subjective Pt arrives today with chronic bil knee pain and frequent falls of a chronic nature.  Pt with complex medical history including obesity (candidate for gastric bypass), bil knee OA, shortness of breath/deconditioning and frequent falls.    Pertinent History obesity, bil knee OA, frequent falls.  Will try to have gastric bypass in June after nutrition couseling    How long can you stand comfortably? 5 min max- dishes, needs to rest due to pain    How long can you walk comfortably? 10 minutes    Diagnostic tests none recent    Patient Stated Goals improve standing and walking    Currently in Pain? Yes    Pain Score 4    up to 7/10 bil knees with standing and walking   Pain Location Knee    Pain Orientation Right;Left    Pain Descriptors / Indicators Burning;Constant    Pain Type Chronic pain    Pain Radiating Towards lower legs to calf    Pain Onset More than a month ago    Pain Frequency Intermittent    Aggravating Factors  standing and walking    Pain Relieving Factors sitting  down to rest, Tylenol    Effect of Pain on Daily Activities limited to standing and walking for short periods              [facility] PT Assessment - 05/14/20 0001      Assessment   Medical Diagnosis bil knee pain/arthritis and mobility issues    Referring Provider (PT) Gaynelle Adu, MD    Onset Date/Surgical Date 05/02/15    Next MD Visit none until presurgery appt    Prior Therapy none      Precautions   Precautions Fall      Restrictions   Weight Bearing Restrictions No      Balance Screen   Has the patient fallen in the past 6 months Yes    How many times? 4   PT will address this in PT   Has the patient had a decrease in activity level because of a fear of falling?  Yes    Is the patient reluctant to leave their home because of a fear of falling?  Yes      Home Environment   Living Environment Private residence    Living Arrangements  Spouse/significant other    Type of Home House    Home Access Level entry    Home Layout One level    Home Equipment Walker - 2 wheels      Prior Function   Level of Independence Needs assistance with ADLs;Needs assistance with homemaking    Vocation Full time employment    Vocation Requirements desk work    Leisure swimming- when weather is nice      Cognition   Overall Cognitive Status Within Functional Limits for tasks assessed      Observation/Other Assessments   Focus on Therapeutic Outcomes (FOTO)  40 (goal of 56)      Posture/Postural Control   Posture/Postural Control Postural limitations    Postural Limitations Forward head;Flexed trunk      ROM / Strength   AROM / PROM / Strength AROM;PROM;Strength      AROM   Overall AROM  Deficits    Overall AROM Comments difficult to assess due to body habitus.  Difficulty with all A/ROM of knees and lumbar spine due to pain      PROM   Overall PROM  Unable to assess      Strength   Overall Strength Deficits    Overall Strength Comments Bil knees and ankles 4/5.      Palpation   Palpation comment diffuse palpable tenderness Rt and Lt knees globally      Transfers   Transfers Sit to Stand;Stand to Sit    Sit to Stand 6: Modified independent (Device/Increase time)    Five time sit to stand comments  14.36 without UE support    Stand to Sit 6: Modified independent (Device/Increase time)      Ambulation/Gait   Ambulation/Gait Yes    Ambulation/Gait Assistance 6: Modified independent (Device/Increase time)    Gait Pattern Antalgic;Decreased trunk rotation;Wide base of support;Abducted - left;Abducted- right    Gait Comments shortness of breath with all mobility                      Objective measurements completed on examination: See above findings.               PT Education - 05/14/20 1702    Education Details Access Code: [email or web address]    Person(s) Educated Patient  Methods  Explanation;Demonstration;Handout    Comprehension Verbalized understanding;Returned demonstration            PT Short Term Goals - 05/14/20 1636      PT SHORT TERM GOAL #1   Title be independent in initial HEP    Time 4    Period Weeks    Status New    Target Date 06/11/20      PT SHORT TERM GOAL #2   Title perform 3 minute walk test to get baseline and show progress when re-tested    Time 4    Period Weeks    Status New    Target Date 06/11/20      PT SHORT TERM GOAL #3   Title report taking mini rest breaks from seated desk work to move legs or take short walk    Time 4    Period Weeks    Status New    Target Date 06/11/20             PT Long Term Goals - 05/14/20 1637      PT LONG TERM GOAL #1   Title be independent in advanced HEP    Time 8    Period Weeks    Status New    Target Date 07/09/20      PT LONG TERM GOAL #2   Title improve FOTO to > or = to 56 to improve function    Baseline 40    Time 8    Period Weeks    Status New    Target Date 07/09/20      PT LONG TERM GOAL #3   Title report < or = to 5/10 Bil knee pain with standing and walking    Baseline 7/10    Time 8    Period Weeks    Status New    Target Date 07/09/20      PT LONG TERM GOAL #4   Title tolerate standing for 7-8 minutes for home tasks due to improve strength and reduced bil knee pain    Time 8    Period Weeks    Status New    Target Date 07/09/20      PT LONG TERM GOAL #5   Title particpate in regular gentle exercise regularly to improve overall mobility, endurance and prepare for upcoming surgery    Time 8    Period Weeks    Status New    Target Date 07/09/20      Additional Long Term Goals   Additional Long Term Goals Yes      PT LONG TERM GOAL #6   Title perform 5x sit to stand in < or = to 11 seconds to reduce falls risk    Time 8    Period Weeks    Status New    Target Date 07/09/20                  Plan - 05/14/20 1716    Clinical  Impression Statement . Pt presents to PT with bil knee pain and mobility issues.  PT is a candidate for weight loss surgery and plans to have gastric bypass surgery in June once her nutritional counseling is done.  Pt with comorbidities including HTN, fatty liver disease, and bil knee OA including Rt knee arthroscopic surgery.  Pt reports frequent falls, bil knee pain that limits mobility and limited endurance due to pain and COPD/shortness of breath.  Pt is limited to walking  5 minutes max due to pain and walking 10 minutes max due to pain and breathing difficulty.  Pt demonstrates significantly reduced speed of mobility and antalgic gait due to obesity and bil knee pain.  Pt performed 5x sit to stand in 14.36 seconds without UE support indicating a falls risk.  Pt with shortness of breath with gait into the clinic and with transitional movements. Global palpable tenderness over bil knees, reduced LE strength and limited functional A/ROM due to pain and body habitus.   Pt has a sedentary job and would like to move more in preparation for her gastric bypass surgery.   PT educate pt on mini breaks and moving legs while working.  Pt will benefit from skilled PT to address bil knee pain, frequent falls and significant mobility deficits to improve independence at home and in the community.    Personal Factors and Comorbidities Comorbidity 3+    Comorbidities obesity, COPD, bil knee OA with surgery, HTN    Examination-Activity Limitations Bathing;Bend;Carry;Dressing;Locomotion Level;Lift;Hygiene/Grooming;Squat;Stairs;Stand;Transfers    Examination-Participation Restrictions Cleaning;Community Activity;Yard Work;Meal Prep;Volunteer;Shop;Laundry    Stability/Clinical Decision Making Evolving/Moderate complexity    Clinical Decision Making Moderate    Rehab Potential Good    PT Frequency 2x / week    PT Duration 8 weeks    PT Treatment/Interventions ADLs/Self Care Home Management;Aquatic  Therapy;Cryotherapy;Electrical Stimulation;Moist Heat;Stair training;Gait training;Functional mobility training;Therapeutic activities;Therapeutic exercise;Neuromuscular re-education;Balance training;Patient/family education;Manual techniques;Taping;Passive range of motion    PT Next Visit Plan review HEP, gentle mobility, work on balance, 3 min walk test to get baseline    PT Home Exercise Plan Access Code: [email or web address]    Consulted and Agree with Plan of Care Patient           Patient will benefit from skilled therapeutic intervention in order to improve the following deficits and impairments:  Abnormal gait,Decreased activity tolerance,Impaired flexibility,Pain,Obesity,Improper body mechanics,Decreased range of motion,Cardiopulmonary status limiting activity,Decreased endurance,Decreased balance,Difficulty walking  Visit Diagnosis: Chronic pain of left knee - Plan: PT plan of care cert/re-cert  Chronic pain of right knee - Plan: PT plan of care cert/re-cert  Muscle weakness (generalized) - Plan: PT plan of care cert/re-cert  Other abnormalities of gait and mobility - Plan: PT plan of care cert/re-cert     Problem List Patient Active Problem List   Diagnosis Date Noted  . Mixed hyperlipidemia 05/14/2020  . Heart murmur 05/14/2020  . COVID 03/09/2020  . Routine general medical examination at a health care facility 07/10/2017  . Chronic diarrhea 02/28/2016  . Osteoarthritis 09/11/2015  . Morbid obesity (HCC) 12/15/2014  . Essential hypertension 12/15/2014     Lorrene ReidKelly Hameed Kolar, PT 05/14/20 5:20 PM   [facility] [address] [place], [place], [address] Phone: [phone]   Fax:  [phone]  Name: Christine Ramirez MRN: [id number] Date of Birth: 11-Mar-1965

## 2020-05-14 NOTE — Progress Notes (Signed)
Cardiology Office Note:    Date:  05/14/2020   ID:  Christine Ramirez, DOB 1965/12/22, MRN [id number]  PCP:  Myrlene Broker, MD   [facility]  Cardiologist:  Christell Constant, MD Distantly Saw Dr. Elease Hashimoto 2017 Advanced Practice Provider:  No care team member to display Electrophysiologist:  None       CC: Surgery Eval: Bariatric General; Dr. Lahoma Crocker for the evaluation of preoperative risk evaluation at the behest of Myrlene Broker, MD   History of Present Illness:    Christine Ramirez is a 55 y.o. female with a hx of NAFLD, COPD, Morbid Obesity, HTN, HLD, who presents for evaluation 05/14/20.  Patient notes that she is feeling similar to 2017 (did not get surgery).  Has had no chest pain, chest pressure, chest tightness, chest stinging.  Discomfort occurs with moderate exertion in the form of DOE, worsens with walking, and improves with rest.  Patient exertion notable for mild walking and  and feels short of breath.  No shortness of breath.  No PND or orthopnea.  Notes leg swelling in both knees and both ankles.  No syncope or near syncope . Notes  no palpitations or funny heart beats.     No history of pre-eclampsia, gestational HTN, or gestational DM. No early menarche, or prematurity.  No Fen-Phen.  Past Medical History:  Diagnosis Date  . Anemia   . Anxiety   . COPD (chronic obstructive pulmonary disease) (HCC)   . Depression   . Fatty liver   . GERD (gastroesophageal reflux disease)   . H/O typhoid fever    age 24  . Headache   . Heart murmur    as a child  . Hepatitis    age 17  . Hyperlipidemia   . Hypertension   . Jaundice    age 15  . Pneumonia    in past  . PONV (postoperative nausea and vomiting)   . Scarlet fever    age 49  . Shortness of breath dyspnea     Past Surgical History:  Procedure Laterality Date  . ABDOMINAL EXPLORATION SURGERY  1975   age 77 at [facility] procedure  .  APPENDECTOMY    . CHOLECYSTECTOMY N/A 03/19/2015   Procedure: LAPAROSCOPIC CHOLECYSTECTOMY WITH INTRAOPERATIVE CHOLANGIOGRAM;  Surgeon: Gaynelle Adu, MD;  Location: [facility];  Service: General;  Laterality: N/A;  . CHOLECYSTECTOMY    . COLONOSCOPY WITH PROPOFOL N/A 07/24/2016   Procedure: COLONOSCOPY WITH PROPOFOL;  Surgeon: Charlott Rakes, MD;  Location: [facility] ENDOSCOPY;  Service: Endoscopy;  Laterality: N/A;  . DILATION AND CURETTAGE OF UTERUS  2000  . KNEE SURGERY Right     Current Medications: Current Meds  Medication Sig  . acetaminophen (TYLENOL) 500 MG tablet Take 1,000 mg by mouth every 6 (six) hours as needed for moderate pain.   . DULoxetine (CYMBALTA) 60 MG capsule TAKE 1 CAPSULE BY MOUTH EVERY DAY  . lisinopril (ZESTRIL) 5 MG tablet Take 1 tablet (5 mg total) by mouth daily. Needs appointment before next refill  . ondansetron (ZOFRAN) 4 MG tablet Take 1 tablet (4 mg total) by mouth every 8 (eight) hours as needed for nausea or vomiting.     Allergies:   Patient has no known allergies.   Social History   Socioeconomic History  . Marital status: Married    Spouse name: Not on file  . Number of children: Not on file  . Years of education: Not  on file  . Highest education level: Not on file  Occupational History  . Not on file  Tobacco Use  . Smoking status: Never Smoker  . Smokeless tobacco: Never Used  Vaping Use  . Vaping Use: Never used  Substance and Sexual Activity  . Alcohol use: Yes    Alcohol/week: 0.0 standard drinks    Comment: rarely  . Drug use: No  . Sexual activity: Not on file  Other Topics Concern  . Not on file  Social History Narrative  . Not on file   Social Determinants of Health   Financial Resource Strain: Not on file  Food Insecurity: Not on file  Transportation Needs: Not on file  Physical Activity: Not on file  Stress: Not on file  Social Connections: Not on file     Family History: The patient's family history includes Alcohol  abuse in her paternal grandfather; Arthritis in her father; Cancer in her maternal grandfather, maternal grandmother, and mother; Depression in her paternal grandmother; Heart disease in her maternal grandfather and maternal grandmother; Melanoma in her mother. History of coronary artery disease notable for grandfather. History of heart failure notable for CHF in GMA. History of arrhythmia notable for no members. No history of bicuspid aortic valve or aortic aneurysm or dissection.    ROS:   Please see the history of present illness.    All other systems reviewed and are negative.  EKGs/Labs/Other Studies Reviewed:    The following studies were reviewed today:  EKG:   05/14/20: SR 89 WNL  Recent Labs: 01/18/2020: ALT 16; BUN 10; Creatinine, Ser 0.85; Hemoglobin 12.4; Platelets 343.0; Potassium 4.0; Sodium 136  Recent Lipid Panel    Component Value Date/Time   CHOL 209 (H) 01/18/2020 1614   TRIG 130.0 01/18/2020 1614   HDL 51.20 01/18/2020 1614   CHOLHDL 4 01/18/2020 1614   VLDL 26.0 01/18/2020 1614   LDLCALC 132 (H) 01/18/2020 1614    Risk Assessment/Calculations:    RCRI: 0  Physical Exam:    VS:  BP 140/70   Pulse 89   Ht 5\' 1"  (1.549 m)   Wt (!) 313 lb 6.4 oz (142.2 kg)   LMP 07/23/2016 (Exact Date)   SpO2 98%   BMI 59.22 kg/m     Wt Readings from Last 3 Encounters:  05/14/20 (!) 313 lb 6.4 oz (142.2 kg)  05/07/20 (!) 318 lb 8 oz (144.5 kg)  01/09/20 (!) 322 lb (146.1 kg)     GEN:  Obese well developed in no acute distress HEENT: Normal NECK: No JVD; No carotid bruits LYMPHATICS: No lymphadenopathy CARDIAC: RRR,II/VI holosystolic murmur, no rubs, gallops RESPIRATORY:  Clear to auscultation without rales, wheezing or rhonchi  ABDOMEN: Soft, non-tender, non-distended MUSCULOSKELETAL:  No edema; No deformity  SKIN: Warm and dry NEUROLOGIC:  Alert and oriented x 3 PSYCHIATRIC:  Normal affect   ASSESSMENT:    1. Essential hypertension   2. Mixed  hyperlipidemia   3. Morbid obesity (HCC)   4. Heart murmur    PLAN:    In order of problems listed above:  Morbid Obesity HTN and HLD Heart Murmur Preoperative Risk Assessment - The Revised Cardiac Risk Index = 0  which equates to 0.4% very low risk of perioperative myocardial infarction, pulmonary edema, ventricular fibrillation, cardiac arrest, or complete heart block.  - DASI score of 19 associated with 5 functional mets - No further cardiac testing is recommended prior to surgery.  - The patient may proceed to surgery  at acceptable risk.   - given the above, with some history of LE swelling, and some SOB; will get an echocardiogram  Fall to Winter follow up unless new symptoms or abnormal test results warranting change in plan  Would be reasonable for  Video Visit Follow up Would be reasonable for  APP Follow up         Medication Adjustments/Labs and Tests Ordered: Current medicines are reviewed at length with the patient today.  Concerns regarding medicines are outlined above.  Orders Placed This Encounter  Procedures  . EKG 12-Lead  . ECHOCARDIOGRAM COMPLETE   No orders of the defined types were placed in this encounter.   Patient Instructions  Medication Instructions:  Your physician recommends that you continue on your current medications as directed. Please refer to the Current Medication list given to you today.  *If you need a refill on your cardiac medications before your next appointment, please call your pharmacy*   Lab Work: NONE If you have labs (blood work) drawn today and your tests are completely normal, you will receive your results only by: Marland Kitchen MyChart Message (if you have MyChart) OR . A paper copy in the mail If you have any lab test that is abnormal or we need to change your treatment, we will call you to review the results.   Testing/Procedures: Your physician has requested that you have an echocardiogram. Echocardiography is a painless  test that uses sound waves to create images of your heart. It provides your doctor with information about the size and shape of your heart and how well your heart's chambers and valves are working. This procedure takes approximately one hour. There are no restrictions for this procedure.     Follow-Up: At [facility], you and your health needs are our priority.  As part of our continuing mission to provide you with exceptional heart care, we have created designated Provider Care Teams.  These Care Teams include your primary Cardiologist (physician) and Advanced Practice Providers (APPs -  Physician Assistants and Nurse Practitioners) who all work together to provide you with the care you need, when you need it.  We recommend signing up for the patient portal called "MyChart".  Sign up information is provided on this After Visit Summary.  MyChart is used to connect with patients for Virtual Visits (Telemedicine).  Patients are able to view lab/test results, encounter notes, upcoming appointments, etc.  Non-urgent messages can be sent to your provider as well.   To learn more about what you can do with MyChart, go to [email or web address].    Your next appointment:   7-8 month(s)  The format for your next appointment:   In Person  Provider:   You may see Christell Constant, MD or one of the following Advanced Practice Providers on your designated Care Team:    Ronie Spies, PA-C  Jacolyn Reedy, PA-C          Signed, Christell Constant, MD  05/14/2020 3:52 PM    [facility]

## 2020-05-21 ENCOUNTER — Encounter: Payer: Federal, State, Local not specified - PPO | Admitting: Skilled Nursing Facility1

## 2020-05-21 ENCOUNTER — Other Ambulatory Visit: Payer: Self-pay

## 2020-05-21 NOTE — Progress Notes (Signed)
Supervised Weight Loss Visit Bariatric Nutrition Education  Referral stated Supervised Weight Loss (SWL) visits needed: 3  1 of 3 visits SWL completed.  Planned surgery: RYGB Pt expectation of surgery: to lose weight and stop overeating Pt expectation of dietitian: to help educate  NUTRITION ASSESSMENT   Anthropometrics  Start weight at [facility]: 318.5 lbs (date: 05/07/2020)  Weight: 315.9 Height: 61 in BMI: 59.69 kg/m2     Clinical  Medical hx: Morbid Obesity, Anemia, COPD, Hyperlipidemia, Hypertension, GERD, depression/anxiety Medications: lisinopril Labs:  Notable signs/symptoms: stated limited mobility  Any previous deficiencies? No  Lifestyle & Dietary Hx Pt states that she is trying to change her habits and being thoughtful about eating, however has a hard time with categorizing food as "good" or "bad". Pt states she eats out for most dinners. Pt states that she has not started food and feelings journaling yet, but is eager to get started. Pt states she only eats when hungry and mentions she does not snack a lot. Pt states she is concerned about not wanting to eat after the surgery and feeling sick from eating, she's concerned about eating, timing and feels overwhelmed with what all she needs to learn in regards to diet prior to surgery. Pt states she has bought tools to help measure foods and has also purchased a large water jug to encourage her water consumption. Pt states she has started to consume decaf coffee and tea. 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 Pt also states she is vitamin D deficient, but will also start on vitamin D supplementation.  Estimated daily fluid intake: 48 oz (more with coffee) Supplements:  Current average weekly physical activity: chair exercises (30 min, 2x/week) and walking (10 min, occasionally but not for exercise)  24-Hr Dietary Recall First Meal: bowl of honey nut  cheerios cereal with 2% milk Snack: Second Meal: can of tomato soup with crackers Snack:  Third Meal: burger and fries or steak, salad w/ ranch dressing, loaded baked potato, grilled shrimp with rice, loaded mashed potatoes Snack:  Beverages: unsweetened tea with trivia, water, coffee  Estimated Energy Needs 1500   NUTRITION DIAGNOSIS  Overweight/obesity ([place]-3.3) related to past poor dietary habits and physical inactivity as evidenced by patient w/ planned RYGB surgery following dietary guidelines for continued weight loss.   NUTRITION INTERVENTION  Nutrition counseling (C-1) and education (E-2) to facilitate bariatric surgery goals. -Dietitian encouraged to focus on goals patient is choosing and not to overwhelm herself with changes or perceived expectations.  Pre-Op Goals Progress & New Goals . Continue: Stay in the moment with your meals . Continue: Work on your activity: dance party 2 times a day 5 minutes at a time . New: Increase water consumption . New: Start food and feelings journal   Learning Style & Readiness for Change Teaching method utilized: Visual & Auditory  Demonstrated degree of understanding via: Teach Back  Readiness Level: contemplating Barriers to learning/adherence to lifestyle change: anxiety  RD's Notes for next Visit  Assess pts adherence to chosen goals    MONITORING & EVALUATION Dietary intake, weekly physical activity, body weight, and pre-op goals in 1 month.   Next Steps  Patient is to follow up at [facility] for Pre-Op Class >2 weeks before surgery for further nutrition education in addition to SWL in 1 month.

## 2020-05-23 ENCOUNTER — Ambulatory Visit: Payer: Federal, State, Local not specified - PPO | Admitting: Physical Therapy

## 2020-05-28 ENCOUNTER — Other Ambulatory Visit: Payer: Self-pay

## 2020-05-28 ENCOUNTER — Ambulatory Visit: Payer: Federal, State, Local not specified - PPO | Attending: General Surgery | Admitting: Physical Therapy

## 2020-05-28 ENCOUNTER — Encounter: Payer: Self-pay | Admitting: Physical Therapy

## 2020-05-28 DIAGNOSIS — G8929 Other chronic pain: Secondary | ICD-10-CM | POA: Diagnosis not present

## 2020-05-28 DIAGNOSIS — M25562 Pain in left knee: Secondary | ICD-10-CM | POA: Insufficient documentation

## 2020-05-28 DIAGNOSIS — M6281 Muscle weakness (generalized): Secondary | ICD-10-CM | POA: Insufficient documentation

## 2020-05-28 DIAGNOSIS — R2689 Other abnormalities of gait and mobility: Secondary | ICD-10-CM | POA: Insufficient documentation

## 2020-05-28 DIAGNOSIS — M25561 Pain in right knee: Secondary | ICD-10-CM | POA: Diagnosis not present

## 2020-05-28 NOTE — Therapy (Addendum)
[facility] [address], [place], [place], [address] Phone: [phone]   Fax:  [phone]  Physical Therapy Treatment  Patient Details  Name: Christine Ramirez MRN: [id number] Date of Birth: August 05, 1965 Referring Provider (PT): Greer Pickerel, MD   Encounter Date: 05/28/2020   PT End of Session - 05/28/20 1702     Visit Number 2    Date for PT Re-Evaluation 07/09/20    Authorization Type [place] - Visit Number 2    Authorization - Number of Visits 42    PT Start Time 1630   Patient arriving 15 minutes late   PT Stop Time 1700    PT Time Calculation (min) 30 min    Activity Tolerance Patient tolerated treatment well    Behavior During Therapy WFL for tasks assessed/performed             Past Medical History:  Diagnosis Date   Anemia    Anxiety    COPD (chronic obstructive pulmonary disease) ([place])    Depression    Fatty liver    GERD (gastroesophageal reflux disease)    H/O typhoid fever    age 44   Headache    Heart murmur    as a child   Hepatitis    age 20   Hyperlipidemia    Hypertension    Jaundice    age 70   Pneumonia    in past   PONV (postoperative nausea and vomiting)    [place] fever    age 49   Shortness of breath dyspnea     Past Surgical History:  Procedure Laterality Date   [place]   age 56 at [place] procedure   APPENDECTOMY     CHOLECYSTECTOMY N/A 03/19/2015   Procedure: [place] CHOLANGIOGRAM;  Surgeon: Greer Pickerel, MD;  Location: [facility];  Service: General;  Laterality: N/A;   CHOLECYSTECTOMY     COLONOSCOPY WITH PROPOFOL N/A 07/24/2016   Procedure: COLONOSCOPY WITH PROPOFOL;  Surgeon: Wilford Corner, MD;  Location: [facility] ENDOSCOPY;  Service: Endoscopy;  Laterality: N/A;   DILATION AND CURETTAGE OF UTERUS  2000   KNEE SURGERY Right     There were no vitals filed for  this visit.   Subjective Assessment - 05/28/20 1629     Subjective Patient reports 6-7/10 pain this date. Reports partial compliance with HEP. Reports increased soreness with daily performance such that she was unable to walk the next day.    Pertinent History obesity, bil knee OA, frequent falls.  Will try to have gastric bypass in June after nutrition couseling    How long can you stand comfortably? 5 min max- dishes, needs to rest due to pain    How long can you walk comfortably? 10 minutes    Diagnostic tests none recent    Patient Stated Goals improve standing and walking    Currently in Pain? Yes    Pain Score 6     Pain Location Knee    Pain Orientation Right;Left    Pain Descriptors / Indicators Burning    Pain Type Chronic pain    Pain Onset More than a month ago    Pain Frequency Intermittent    Multiple Pain Sites No                [facility] PT Assessment - 05/28/20 0001       Standardized Balance Assessment  Standardized Balance Assessment Berg Balance Test      Berg Balance Test   Sit to Stand Able to stand without using hands and stabilize independently    Standing Unsupported Able to stand safely 2 minutes    Sitting with Back Unsupported but Feet Supported on Floor or Stool Able to sit safely and securely 2 minutes    Stand to Sit Sits independently, has uncontrolled descent    Transfers Able to transfer safely, minor use of hands    Standing Unsupported with Eyes Closed Able to stand 10 seconds with supervision    Standing Unsupported with Feet Together Able to place feet together independently but unable to hold for 30 seconds    From Standing, Reach Forward with Outstretched Arm Can reach forward >12 cm safely (5")    From Standing Position, Pick up Object from [place] to pick up shoe safely and easily    From Standing Position, Turn to Look Behind Over each Shoulder Looks behind from both sides and weight shifts well    Turn 360 Degrees Able to turn 360  degrees safely in 4 seconds or less    Standing Unsupported, Alternately Place Feet on Step/Stool Able to complete >2 steps/needs minimal assist    Standing Unsupported, One Foot in Front Needs help to step but can hold 15 seconds    Standing on One Leg Unable to try or needs assist to prevent fall    Total Score 39                           [facility] Adult PT Treatment/Exercise - 05/28/20 0001       Exercises   Exercises Knee/Hip      Knee/Hip Exercises: Seated   Ball Squeeze x10 repetitions    Hamstring Curl Both;1 set;10 reps    Hamstring Limitations yellow loop      Knee/Hip Exercises: Supine   Other Supine Knee/Hip Exercises clam; yellow loop; x10 repetitions      Knee/Hip Exercises: Sidelying   Hip ABduction Both;1 set;10 reps                    PT Education - 05/28/20 1659     Education Details Access Code: [email or web address]; added sidelying hip abduction    Person(s) Educated Patient    Methods Explanation;Demonstration;Tactile cues;Verbal cues    Comprehension Verbalized understanding;Returned demonstration;Verbal cues required;Tactile cues required              PT Short Term Goals - 05/14/20 1636       PT SHORT TERM GOAL #1   Title be independent in initial HEP    Time 4    Period Weeks    Status New    Target Date 06/11/20      PT SHORT TERM GOAL #2   Title perform 3 minute walk test to get baseline and show progress when re-tested    Time 4    Period Weeks    Status New    Target Date 06/11/20      PT SHORT TERM GOAL #3   Title report taking mini rest breaks from seated desk work to move legs or take short walk    Time 4    Period Weeks    Status New    Target Date 06/11/20               PT Long Term Goals - 05/14/20 1637  PT LONG TERM GOAL #1   Title be independent in advanced HEP    Time 8    Period Weeks    Status New    Target Date 07/09/20      PT LONG TERM GOAL #2   Title improve FOTO to > or = to 56  to improve function    Baseline 40    Time 8    Period Weeks    Status New    Target Date 07/09/20      PT LONG TERM GOAL #3   Title report < or = to 5/10 Bil knee pain with standing and walking    Baseline 7/10    Time 8    Period Weeks    Status New    Target Date 07/09/20      PT LONG TERM GOAL #4   Title tolerate standing for 7-8 minutes for home tasks due to improve strength and reduced bil knee pain    Time 8    Period Weeks    Status New    Target Date 07/09/20      PT LONG TERM GOAL #5   Title particpate in regular gentle exercise regularly to improve overall mobility, endurance and prepare for upcoming surgery    Time 8    Period Weeks    Status New    Target Date 07/09/20      Additional Long Term Goals   Additional Long Term Goals Yes      PT LONG TERM GOAL #6   Title perform 5x sit to stand in < or = to 11 seconds to reduce falls risk    Time 8    Period Weeks    Status New    Target Date 07/09/20                   Plan - 05/28/20 1659     Clinical Impression Statement Patient scoring 39/56 on BERG balance scale indicating high fall risk. Noting intermittent posterior sway in standing and poor proprioceptive postural awareness. Balance further impaired by poor core and hip strength which affects postural control in standing. Unable to tolerate standing hip abduction due to increased knee pain of stance limb. Verbal cues for eccentric control when performing hip abduction in sidelying. Would benefit from continued skilled intervention to address impairments for decreased fall risk and improved pain.    Personal Factors and Comorbidities Comorbidity 3+    Comorbidities obesity, COPD, bil knee OA with surgery, HTN    Examination-Activity Limitations Bathing;Bend;Carry;Dressing;Locomotion Level;Lift;Hygiene/Grooming;Squat;Stairs;Stand;Transfers    Examination-Participation Restrictions Cleaning;Community Activity;Yard Work;Meal  Prep;Volunteer;Shop;Laundry    Rehab Potential Good    PT Frequency 2x / week    PT Duration 8 weeks    PT Treatment/Interventions ADLs/Self Care Home Management;Aquatic Therapy;Cryotherapy;Electrical Stimulation;Moist Heat;Stair training;Gait training;Functional mobility training;Therapeutic activities;Therapeutic exercise;Neuromuscular re-education;Balance training;Patient/family education;Manual techniques;Taping;Passive range of motion    PT Next Visit Plan review HEP; 3 MWT next session; continue balance and LE strenthening to patient tolerance    PT Home Exercise Plan Access Code: [email or web address]    Consulted and Agree with Plan of Care Patient             Patient will benefit from skilled therapeutic intervention in order to improve the following deficits and impairments:  Abnormal gait,Decreased activity tolerance,Impaired flexibility,Pain,Obesity,Improper body mechanics,Decreased range of motion,Cardiopulmonary status limiting activity,Decreased endurance,Decreased balance,Difficulty walking  Visit Diagnosis: Chronic pain of left knee  Chronic pain of right knee  Muscle weakness (generalized)  Other abnormalities of gait and mobility     Problem List Patient Active Problem List   Diagnosis Date Noted   Mixed hyperlipidemia 05/14/2020   Heart murmur 05/14/2020   COVID 03/09/2020   Routine general medical examination at a health care facility 07/10/2017   Chronic diarrhea 02/28/2016   Osteoarthritis 09/11/2015   Morbid obesity ([place]) 12/15/2014   Essential hypertension 12/15/2014    PHYSICAL THERAPY DISCHARGE SUMMARY  Visits from Start of Care: 2  Current functional level related to goals / functional outcomes: See above   Remaining deficits: See above   Education / Equipment: See above   Patient agrees to discharge. Patient goals were not met. Patient is being discharged due to not returning since the last visit.   Everardo All PT, DPT  05/28/20 5:04  PM   [facility] [address] [place], [place], [address] Phone: [phone]   Fax:  [phone]  Name: Christine Ramirez MRN: [id number] Date of Birth: 03-19-1965

## 2020-05-28 NOTE — Patient Instructions (Signed)
Sidelying Hip Abduction - 1 x daily - 7 x weekly - 2 sets - 10 reps

## 2020-05-30 ENCOUNTER — Encounter: Payer: Federal, State, Local not specified - PPO | Admitting: Physical Therapy

## 2020-06-04 ENCOUNTER — Encounter: Payer: Federal, State, Local not specified - PPO | Admitting: Physical Therapy

## 2020-06-04 DIAGNOSIS — N950 Postmenopausal bleeding: Secondary | ICD-10-CM | POA: Diagnosis not present

## 2020-06-04 DIAGNOSIS — F419 Anxiety disorder, unspecified: Secondary | ICD-10-CM | POA: Diagnosis not present

## 2020-06-04 DIAGNOSIS — Z01411 Encounter for gynecological examination (general) (routine) with abnormal findings: Secondary | ICD-10-CM | POA: Diagnosis not present

## 2020-06-04 DIAGNOSIS — Z124 Encounter for screening for malignant neoplasm of cervix: Secondary | ICD-10-CM | POA: Diagnosis not present

## 2020-06-04 DIAGNOSIS — Z6841 Body Mass Index (BMI) 40.0 and over, adult: Secondary | ICD-10-CM | POA: Diagnosis not present

## 2020-06-04 DIAGNOSIS — Z113 Encounter for screening for infections with a predominantly sexual mode of transmission: Secondary | ICD-10-CM | POA: Diagnosis not present

## 2020-06-04 DIAGNOSIS — Z01419 Encounter for gynecological examination (general) (routine) without abnormal findings: Secondary | ICD-10-CM | POA: Diagnosis not present

## 2020-06-08 ENCOUNTER — Other Ambulatory Visit: Payer: Self-pay

## 2020-06-08 ENCOUNTER — Ambulatory Visit (HOSPITAL_COMMUNITY): Payer: Federal, State, Local not specified - PPO | Attending: Cardiology

## 2020-06-08 DIAGNOSIS — R011 Cardiac murmur, unspecified: Secondary | ICD-10-CM | POA: Insufficient documentation

## 2020-06-08 LAB — ECHOCARDIOGRAM COMPLETE
Area-P 1/2: 3.42 cm2
P 1/2 time: 360 msec
S' Lateral: 3.1 cm

## 2020-06-11 ENCOUNTER — Encounter: Payer: Federal, State, Local not specified - PPO | Admitting: Physical Therapy

## 2020-06-13 ENCOUNTER — Encounter: Payer: Federal, State, Local not specified - PPO | Admitting: Physical Therapy

## 2020-06-20 ENCOUNTER — Encounter: Payer: Federal, State, Local not specified - PPO | Admitting: Physical Therapy

## 2020-06-21 ENCOUNTER — Other Ambulatory Visit: Payer: Self-pay

## 2020-06-21 ENCOUNTER — Encounter: Payer: Federal, State, Local not specified - PPO | Attending: General Surgery | Admitting: Skilled Nursing Facility1

## 2020-06-21 DIAGNOSIS — E669 Obesity, unspecified: Secondary | ICD-10-CM | POA: Insufficient documentation

## 2020-06-21 NOTE — Progress Notes (Signed)
Supervised Weight Loss Visit Bariatric Nutrition Education  Referral stated Supervised Weight Loss (SWL) visits needed: 3  2 of 3 visits SWL completed.  Planned surgery: RYGB Pt expectation of surgery: to lose weight and stop overeating Pt expectation of dietitian: to help educate  NUTRITION ASSESSMENT   Anthropometrics  Start weight at [facility]: 318.5 lbs (date: 05/07/2020)  Weight: 316.8 lbs Height: 61 in BMI: 59.86 kg/m2     Clinical  Medical hx: Morbid Obesity, Anemia, COPD, Hyperlipidemia, Hypertension, GERD, depression/anxiety Medications: lisinopril Labs:  Notable signs/symptoms: stated limited mobility  Any previous deficiencies? No  Lifestyle & Dietary Hx  Pt states she was really worried about weight gain and worried she would not be cleared for surgery by her insurance company. Pt states she believes she gained weight from last month due to the Easter holiday and a death in her family. Pt states for the last month she has been very aware of what she has been eating and was so liberated to think there are no bad foods which helped her to retain healthier portion sizes. Pt states due to her husband having such high tryglycerides they are now on the same page. Pt states she has been logging her food along with goals and action steps in a structured notebook.  Pt states when she is forced to leave the house she is more mobile and believes that is what it would take for her to increase her mobility but has a fear of judgement when going to the store.   Estimated daily fluid intake: 48 oz (more with coffee) Supplements:  Current average weekly physical activity: chair exercises (30 min, 2x/week) and walking (10 min, occasionally but not for exercise)  24-Hr Dietary Recall First Meal: bowl of honey nut cheerios cereal with 2% milk Snack: Second Meal: can of tomato soup with crackers Snack:  Third Meal: burger and fries or steak, salad w/ ranch dressing, loaded baked potato,  grilled shrimp with rice, loaded mashed potatoes Snack: sugar free jello + canned fruit (realized this is high sugar) or sugar free pudding Beverages: unsweetened tea with trivia, water, coffee  Estimated Energy Needs 1500   NUTRITION DIAGNOSIS  Overweight/obesity ([place]-3.3) related to past poor dietary habits and physical inactivity as evidenced by patient w/ planned RYGB surgery following dietary guidelines for continued weight loss.   NUTRITION INTERVENTION  Nutrition counseling (C-1) and education (E-2) to facilitate bariatric surgery goals. -Dietitian encouraged to focus on goals patient is choosing and not to overwhelm herself with changes or perceived expectations.  Pre-Op Goals Progress & New Goals . Continue: Stay in the moment with your meals . Continue: Work on your activity: dance party 2 times a day 5 minutes at a time (too much pain) . continue: Increase water consumption . Continue: Start food and feelings journal  . NEW: go on an outing 1 time a week each week . NEW: 3 times a week chair exercises   Learning Style & Readiness for Change Teaching method utilized: Visual & Auditory  Demonstrated degree of understanding via: Teach Back  Readiness Level: contemplating Barriers to learning/adherence to lifestyle change: anxiety  RD's Notes for next Visit  Assess pts adherence to chosen goals  Discuss food journal   MONITORING & EVALUATION Dietary intake, weekly physical activity, body weight, and pre-op goals in 1 month.   Next Steps  Patient is to follow up at [facility] for Pre-Op Class >2 weeks before surgery for further nutrition education in addition to SWL in 1  month.

## 2020-06-27 ENCOUNTER — Encounter: Payer: Federal, State, Local not specified - PPO | Admitting: Physical Therapy

## 2020-07-04 ENCOUNTER — Ambulatory Visit
Admission: RE | Admit: 2020-07-04 | Discharge: 2020-07-04 | Disposition: A | Discharge status: Home / Self Care | Payer: Federal, State, Local not specified - PPO | Source: Ambulatory Visit | Attending: Obstetrics and Gynecology | Admitting: Obstetrics and Gynecology

## 2020-07-04 ENCOUNTER — Other Ambulatory Visit: Payer: Self-pay

## 2020-07-04 DIAGNOSIS — Z1231 Encounter for screening mammogram for malignant neoplasm of breast: Secondary | ICD-10-CM

## 2020-07-19 ENCOUNTER — Encounter: Payer: Federal, State, Local not specified - PPO | Attending: General Surgery | Admitting: Skilled Nursing Facility1

## 2020-07-19 ENCOUNTER — Other Ambulatory Visit: Payer: Self-pay

## 2020-07-19 NOTE — Progress Notes (Addendum)
Supervised Weight Loss Visit Bariatric Nutrition Education  Referral stated Supervised Weight Loss (SWL) visits needed: 3  3 of 3 visits SWL completed.  Pt completed visits.   Pt has cleared nutrition requirements.     Planned surgery: RYGB Pt expectation of surgery: to lose weight and stop overeating Pt expectation of dietitian: to help educate  NUTRITION ASSESSMENT   Anthropometrics  Start weight at [facility]: 318.5 lbs (date: 05/07/2020)  Weight: 317.4 lbs Height: 61 in BMI: 59.97 kg/m2     Clinical  Medical hx: Morbid Obesity, Anemia, COPD, Hyperlipidemia, Hypertension, GERD, depression/anxiety Medications: lisinopril Labs:  Notable signs/symptoms: stated limited mobility  Any previous deficiencies? No  Lifestyle & Dietary Hx  Pt state she has been realizing how negative she is with herself and is trying to reframe those thoughts. Pt states she went to the [employer] concert which she is really excited about. Pt state she has been doing a lot of mandatory overtime at work. Pt state she has been having chocolate cravings stating she thinks it be hormonal stuff. Pt states her husband has been better about serving her smaller portions. Pt states her pool is open now but not had much time to get in it.   Pt states she feels she may struggle with getting the water in throughout the day and not with meals.  Pt states in the last few months she ha learned how to better stick to the nutrition recommendations.   Estimated daily fluid intake: 48 oz (more with coffee) Supplements:  Current average weekly physical activity: chair exercises (30 min, 2x/week) and walking (10 min, occasionally but not for exercise)  24-Hr Dietary Recall First Meal: bowl of honey nut cheerios cereal with 2% milk or bagel + cream cheese or eggs Snack: strawberries  Second Meal: can of tomato soup with crackers or shrimp fajitas  Snack: dove dark chocolate  Third Meal: burger and fries or steak,  salad w/ ranch dressing, loaded baked potato, grilled shrimp with rice, loaded mashed potatoes or pieology pizza or rice + chicken + broccoli Snack: sugar free jello + canned fruit (realized this is high sugar) or sugar free pudding Beverages: unsweetened tea with trivia, water, coffee  Estimated Energy Needs 1500   NUTRITION DIAGNOSIS  Overweight/obesity ([place]-3.3) related to past poor dietary habits and physical inactivity as evidenced by patient w/ planned RYGB surgery following dietary guidelines for continued weight loss.   NUTRITION INTERVENTION  Nutrition counseling (C-1) and education (E-2) to facilitate bariatric surgery goals. -Dietitian encouraged to focus on goals patient is choosing and not to overwhelm herself with changes or perceived expectations.  Pre-Op Goals Progress & New Goals . Continue: Stay in the moment with your meals . Continue: Work on your activity: dance party 2 times a day 5 minutes at a time (too much pain) . continue: Increase water consumption . Continue: Start food and feelings journal  . continue: go on an outing 1 time a week each week . continue: 3 times a week chair exercises   Learning Style & Readiness for Change Teaching method utilized: Visual & Auditory  Demonstrated degree of understanding via: Teach Back  Readiness Level: contemplating Barriers to learning/adherence to lifestyle change: anxiety   MONITORING & EVALUATION Dietary intake, weekly physical activity, body weight, and pre-op goals   Next Steps  Patient is to follow up at [facility] for Pre-Op Class >2 weeks before surgery  Pt has completed visits. No further supervised visits required/recomended

## 2020-09-03 ENCOUNTER — Encounter: Payer: Federal, State, Local not specified - PPO | Attending: General Surgery | Admitting: Skilled Nursing Facility1

## 2020-09-03 ENCOUNTER — Other Ambulatory Visit: Payer: Self-pay

## 2020-09-03 NOTE — Progress Notes (Signed)
Pre-Operative Nutrition Class:    Patient was seen on 09/03/2020 for Pre-Operative Bariatric Surgery Education at [facility]    Surgery date: 09/18/2020 Surgery type: rygb Start weight at [facility]: 318.5 Weight today: pt arrived too late to take a weight  Samples given per MNT protocol. Patient educated on appropriate usage: Protien 20 shake Lot: [id number] Exp: 07/06/21   Bariatric Advantage Calcium Lot # [id number] Exp: 02/23     procare Vitamins Calcium Lot # [id number] Exp: 01/23   Celebrate Protein Powder   Lot # [id number] Exp: 04/23  The following the learning objectives were met by the patient during this course: Identify Pre-Op Dietary Goals and will begin 2 weeks pre-operatively Identify appropriate sources of fluids and proteins  State protein recommendations and appropriate sources pre and post-operatively Identify Post-Operative Dietary Goals and will follow for 2 weeks post-operatively Identify appropriate multivitamin and calcium sources Describe the need for physical activity post-operatively and will follow MD recommendations State when to call healthcare provider regarding medication questions or post-operative complications When having a diagnosis of diabetes understanding hypoglycemia symptoms and the inclusion of 1 complex carbohydrate per meal  Handouts given during class include: Pre-Op Bariatric Surgery Diet Handout Protein Shake Handout Post-Op Bariatric Surgery Nutrition Handout BELT Program Information Flyer Support Group Information Flyer [facility] Bariatric Supplements Price List  Follow-Up Plan: Patient will follow-up at [facility] 2 weeks post operatively for diet advancement per MD.

## 2020-09-12 ENCOUNTER — Other Ambulatory Visit (HOSPITAL_COMMUNITY): Payer: Self-pay

## 2020-09-12 ENCOUNTER — Ambulatory Visit: Payer: Self-pay | Admitting: General Surgery

## 2020-09-12 NOTE — Progress Notes (Signed)
Please place orders in epic pt. Is scheduled for preop 

## 2020-09-12 NOTE — Patient Instructions (Addendum)
DUE TO COVID-19 ONLY ONE VISITOR IS ALLOWED TO COME WITH YOU AND STAY IN THE WAITING ROOM ONLY DURING PRE OP AND PROCEDURE DAY OF SURGERY.   TWO VISITOR  MAY VISIT WITH YOU AFTER SURGERY IN YOUR PRIVATE ROOM DURING VISITING HOURS ONLY!  YOU NEED TO HAVE A COVID 19 TEST ON__7-22-22_____ @_______ , THIS TEST MUST BE DONE BEFORE SURGERY,    COVID TESTING SITE [address] [place] ,   IT IS ON THE RIGHT GOING OUT [address] APPROXIMATELY  2 MINUTES PAST [employer] ON THE RIGHT. ONCE YOUR COVID TEST IS COMPLETED,  PLEASE Wear a mask when in public           Your procedure is scheduled on: 09-18-20   Report to [facility] Main  Entrance   Report to short stay  at      0515  AM     Call this number if you have problems the morning of surgery [phone]    Remember: MORNING OF SURGERY DRINK:   DRINK 1 G2 drink BEFORE YOU LEAVE HOME, DRINK ALL OF THE  G2 DRINK AT ONE TIME.    NO SOLID FOOD AFTER 600 PM THE NIGHT BEFORE YOUR SURGERY.   YOU MAY DRINK CLEAR FLUIDS. THE G2 DRINK YOU DRINK BEFORE YOU LEAVE HOME WILL BE THE LAST FLUIDS YOU DRINK BEFORE SURGERY.  PAIN IS EXPECTED AFTER SURGERY AND WILL NOT BE COMPLETELY ELIMINATED. AMBULATION AND TYLENOL WILL HELP REDUCE INCISIONAL AND GAS PAIN. MOVEMENT IS KEY!  YOU ARE EXPECTED TO BE OUT OF BED WITHIN 4 HOURS OF ADMISSION TO YOUR PATIENT ROOM.  SITTING IN THE RECLINER THROUGHOUT THE DAY IS IMPORTANT FOR DRINKING FLUIDS AND MOVING GAS THROUGHOUT THE GI TRACT.  COMPRESSION STOCKINGS SHOULD BE WORN [facility] STAY UNLESS YOU ARE WALKING.   INCENTIVE SPIROMETER SHOULD BE USED EVERY HOUR WHILE AWAKE TO DECREASE POST-OPERATIVE COMPLICATIONS SUCH AS PNEUMONIA.  WHEN DISCHARGED HOME, IT IS IMPORTANT TO CONTINUE TO WALK EVERY HOUR AND USE THE INCENTIVE SPIROMETER EVERY HOUR.            BRUSH YOUR TEETH MORNING OF SURGERY AND RINSE YOUR MOUTH OUT, NO CHEWING GUM CANDY OR  MINTS.     Take these medicines the morning of surgery with A SIP OF WATER: NONE  DO NOT TAKE ANY DIABETIC MEDICATIONS DAY OF YOUR SURGERY                               You may not have any metal on your body including hair pins and              piercings  Do not wear jewelry, make-up, lotions, powders or perfumes, deodorant             Do not wear nail polish on your fingernails or toenails .  Do not shave  48 hours prior to surgery.               Do not bring valuables to the hospital. [facility] IS NOT             RESPONSIBLE   FOR VALUABLES.  Contacts, dentures or bridgework may not be worn into surgery.  .     Patients discharged the day of surgery will not be allowed to drive home. IF YOU ARE HAVING SURGERY AND GOING HOME THE SAME DAY, YOU MUST HAVE AN ADULT TO  DRIVE YOU HOME AND BE WITH YOU FOR 24 HOURS. YOU MAY GO HOME BY TAXI OR UBER OR ORTHERWISE, BUT AN ADULT MUST ACCOMPANY YOU HOME AND STAY WITH YOU FOR 24 HOURS.  Name and phone number of your driver:  Special Instructions: N/A              Please read over the following fact sheets you were given: _____________________________________________________________________             [facility] - Preparing for Surgery Before surgery, you can play an important role.  Because skin is not sterile, your skin needs to be as free of germs as possible.  You can reduce the number of germs on your skin by washing with CHG (chlorahexidine gluconate) soap before surgery.  CHG is an antiseptic cleaner which kills germs and bonds with the skin to continue killing germs even after washing. Please DO NOT use if you have an allergy to CHG or antibacterial soaps.  If your skin becomes reddened/irritated stop using the CHG and inform your nurse when you arrive at Short Stay. Do not shave (including legs and underarms) for at least 48 hours prior to the first CHG shower.  You may shave your face/neck. Please follow these instructions  carefully:  1.  Shower with CHG Soap the night before surgery and the  morning of Surgery.  2.  If you choose to wash your hair, wash your hair first as usual with your  normal  shampoo.  3.  After you shampoo, rinse your hair and body thoroughly to remove the  shampoo.                           4.  Use CHG as you would any other liquid soap.  You can apply chg directly  to the skin and wash                       Gently with a scrungie or clean washcloth.  5.  Apply the CHG Soap to your body ONLY FROM THE NECK DOWN.   Do not use on face/ open                           Wound or open sores. Avoid contact with eyes, ears mouth and genitals (private parts).                       Wash face,  Genitals (private parts) with your normal soap.             6.  Wash thoroughly, paying special attention to the area where your surgery  will be performed.  7.  Thoroughly rinse your body with warm water from the neck down.  8.  DO NOT shower/wash with your normal soap after using and rinsing off  the CHG Soap.                9.  Pat yourself dry with a clean towel.            10.  Wear clean pajamas.            11.  Place clean sheets on your bed the night of your first shower and do not  sleep with pets. Day of Surgery : Do not apply any lotions/deodorants the morning of surgery.  Please wear clean clothes to  the hospital/surgery center.  FAILURE TO FOLLOW THESE INSTRUCTIONS MAY RESULT IN THE CANCELLATION OF YOUR SURGERY PATIENT SIGNATURE_________________________________  NURSE SIGNATURE__________________________________  __  Incentive Spirometer  An incentive spirometer is a tool that can help keep your lungs clear and active. This tool measures how well you are filling your lungs with each breath. Taking long deep breaths may help reverse or decrease the chance of developing breathing (pulmonary) problems (especially infection) following: A long period of time when you are unable to move or be  active. BEFORE THE PROCEDURE  If the spirometer includes an indicator to show your best effort, your nurse or respiratory therapist will set it to a desired goal. If possible, sit up straight or lean slightly forward. Try not to slouch. Hold the incentive spirometer in an upright position. INSTRUCTIONS FOR USE  Sit on the edge of your bed if possible, or sit up as far as you can in bed or on a chair. Hold the incentive spirometer in an upright position. Breathe out normally. Place the mouthpiece in your mouth and seal your lips tightly around it. Breathe in slowly and as deeply as possible, raising the piston or the ball toward the top of the column. Hold your breath for 3-5 seconds or for as long as possible. Allow the piston or ball to fall to the bottom of the column. Remove the mouthpiece from your mouth and breathe out normally. Rest for a few seconds and repeat Steps 1 through 7 at least 10 times every 1-2 hours when you are awake. Take your time and take a few normal breaths between deep breaths. The spirometer may include an indicator to show your best effort. Use the indicator as a goal to work toward during each repetition. After each set of 10 deep breaths, practice coughing to be sure your lungs are clear. If you have an incision (the cut made at the time of surgery), support your incision when coughing by placing a pillow or rolled up towels firmly against it. Once you are able to get out of bed, walk around indoors and cough well. You may stop using the incentive spirometer when instructed by your caregiver.  RISKS AND COMPLICATIONS Take your time so you do not get dizzy or light-headed. If you are in pain, you may need to take or ask for pain medication before doing incentive spirometry. It is harder to take a deep breath if you are having pain. AFTER USE Rest and breathe slowly and easily. It can be helpful to keep track of a log of your progress. Your caregiver can provide you  with a simple table to help with this. If you are using the spirometer at home, follow these instructions: SEEK MEDICAL CARE IF:  You are having difficultly using the spirometer. You have trouble using the spirometer as often as instructed. Your pain medication is not giving enough relief while using the spirometer. You develop fever of 100.5 F (38.1 C) or higher. SEEK IMMEDIATE MEDICAL CARE IF:  You cough up bloody sputum that had not been present before. You develop fever of 102 F (38.9 C) or greater. You develop worsening pain at or near the incision site. MAKE SURE YOU:  Understand these instructions. Will watch your condition. Will get help right away if you are not doing well or get worse. Document Released: 06/23/2006 Document Revised: 05/05/2011 Document Reviewed: 08/24/2006 [facility] Patient Information 2014 [employer], [place].   ________________________________________________________________________ ______________________________________________________________________

## 2020-09-12 NOTE — Progress Notes (Addendum)
PCP - Dr. Hillard Danker Cardiologist - Izora Ribas, [place] Dragon, MD LOV 05-14-20 with clearance   PPM/ICD -  Device Orders -  Rep Notified -   Chest x-ray - 2017 EKG - 05-14-20 Stress Test -  ECHO - 06-08-20 Cardiac Cath -   Sleep Study -  CPAP -   Fasting Blood Sugar -  Checks Blood Sugar _____ times a day  Blood Thinner Instructions: Aspirin Instructions:  ERAS Protcol - PRE-SURGERY Ensure or G2-   COVID TEST- 09-14-20  Activity--SOB with activity , Can get dressed without SOB . Weight related SOB per pt. Has cardiac clearance  Anesthesia review: murmur, HTN, COPD  Patient denies shortness of breath, fever, cough and chest pain at PAT appointment   All instructions explained to the patient, with a verbal understanding of the material. Patient agrees to go over the instructions while at home for a better understanding. Patient also instructed to self quarantine after being tested for COVID-19. The opportunity to ask questions was provided.

## 2020-09-14 ENCOUNTER — Other Ambulatory Visit (HOSPITAL_COMMUNITY)
Admission: RE | Admit: 2020-09-14 | Discharge: 2020-09-14 | Disposition: A | Discharge status: Home / Self Care | Payer: Federal, State, Local not specified - PPO | Source: Ambulatory Visit | Attending: General Surgery | Admitting: General Surgery

## 2020-09-14 ENCOUNTER — Other Ambulatory Visit: Payer: Self-pay

## 2020-09-14 ENCOUNTER — Encounter (HOSPITAL_COMMUNITY)
Admission: RE | Admit: 2020-09-14 | Discharge: 2020-09-14 | Disposition: A | Discharge status: Home / Self Care | Payer: Federal, State, Local not specified - PPO | Source: Ambulatory Visit | Attending: General Surgery | Admitting: General Surgery

## 2020-09-14 ENCOUNTER — Encounter (HOSPITAL_COMMUNITY): Payer: Self-pay

## 2020-09-14 ENCOUNTER — Ambulatory Visit: Payer: Self-pay | Admitting: General Surgery

## 2020-09-14 DIAGNOSIS — Z01812 Encounter for preprocedural laboratory examination: Secondary | ICD-10-CM | POA: Diagnosis not present

## 2020-09-14 DIAGNOSIS — Z20822 Contact with and (suspected) exposure to covid-19: Secondary | ICD-10-CM | POA: Insufficient documentation

## 2020-09-14 DIAGNOSIS — K760 Fatty (change of) liver, not elsewhere classified: Secondary | ICD-10-CM | POA: Diagnosis not present

## 2020-09-14 DIAGNOSIS — E785 Hyperlipidemia, unspecified: Secondary | ICD-10-CM | POA: Diagnosis not present

## 2020-09-14 DIAGNOSIS — R7303 Prediabetes: Secondary | ICD-10-CM | POA: Diagnosis not present

## 2020-09-14 HISTORY — DX: Unspecified osteoarthritis, unspecified site: M19.90

## 2020-09-14 LAB — COMPREHENSIVE METABOLIC PANEL
ALT: 22 U/L (ref 0–44)
AST: 20 U/L (ref 15–41)
Albumin: 4.1 g/dL (ref 3.5–5.0)
Alkaline Phosphatase: 54 U/L (ref 38–126)
Anion gap: 8 (ref 5–15)
BUN: 17 mg/dL (ref 6–20)
CO2: 26 mmol/L (ref 22–32)
Calcium: 9.4 mg/dL (ref 8.9–10.3)
Chloride: 106 mmol/L (ref 98–111)
Creatinine, Ser: 0.73 mg/dL (ref 0.44–1.00)
GFR, Estimated: >60 mL/min (ref >60)
Glucose, Bld: 98 mg/dL (ref 70–99)
Potassium: 4.8 mmol/L (ref 3.5–5.1)
Sodium: 140 mmol/L (ref 135–145)
Total Bilirubin: 0.5 mg/dL (ref 0.3–1.2)
Total Protein: 7.7 g/dL (ref 6.5–8.1)

## 2020-09-14 LAB — CBC WITH DIFFERENTIAL/PLATELET
Abs Immature Granulocytes: 0.05 10*3/uL (ref 0.00–0.07)
Basophils Absolute: 0 10*3/uL (ref 0.0–0.1)
Basophils Relative: 0 %
Eosinophils Absolute: 0.1 10*3/uL (ref 0.0–0.5)
Eosinophils Relative: 1 %
HCT: 40.8 % (ref 36.0–46.0)
Hemoglobin: 12.6 g/dL (ref 12.0–15.0)
Immature Granulocytes: 1 %
Lymphocytes Relative: 18 %
Lymphs Abs: 1.5 10*3/uL (ref 0.7–4.0)
MCH: 28.3 pg (ref 26.0–34.0)
MCHC: 30.9 g/dL (ref 30.0–36.0)
MCV: 91.5 fL (ref 80.0–100.0)
Monocytes Absolute: 0.5 10*3/uL (ref 0.1–1.0)
Monocytes Relative: 6 %
Neutro Abs: 6.2 10*3/uL (ref 1.7–7.7)
Neutrophils Relative %: 74 %
Platelets: 302 10*3/uL (ref 150–400)
RBC: 4.46 MIL/uL (ref 3.87–5.11)
RDW: 15.8 % — ABNORMAL HIGH (ref 11.5–15.5)
WBC: 8.3 10*3/uL (ref 4.0–10.5)
nRBC: 0 % (ref 0.0–0.2)

## 2020-09-14 LAB — SARS CORONAVIRUS 2 (TAT 6-24 HRS): SARS Coronavirus 2: NEGATIVE

## 2020-09-14 NOTE — H&P (Signed)
PROVIDER:  Donyale Falcon Sherril Cong, MD   MRN: [id number] DOB: [birth year] DATE OF ENCOUNTER: 09/14/2020 Subjective    Chief Complaint: Here to discuss surgery     History of Present Illness: Christine Ramirez is a 55 y.o. female who is seen today for severe obesity and related comorbidities.  I last saw her in the clinic in February.  She has completed the bariatric surgery evaluation process.   Her comorbidities include hypertension, fatty liver disease, bilateral knee osteoarthritis, prediabetes, dyslipidemia, vitamin D deficiency, mild obstructive sleep apnea, GERD   She completed a few physical therapy sessions.  She has received nutritional and psychological clearance.  She also saw cardiology in March and was felt to be low risk.   He denies any medical changes since I last saw her in February.  Her weight at that time was 316 pounds.  She had a remote upper GI in 2017 which was in normal limits.  An EKG from a few months ago was within normal limits.  She is accompanied by her husband.   Review of Systems: A complete review of systems was obtained from the patient.  I have reviewed this information and discussed as appropriate with the patient.  See HPI as well for other ROS.   ROS      Medical History: Past Medical History      Past Medical History:  Diagnosis Date   Anxiety     GERD (gastroesophageal reflux disease)             Patient Active Problem List  Diagnosis   Pain of both hip joints   Essential hypertension   Mixed hyperlipidemia   Morbid obesity (CMS-HCC)   Lumbar radiculopathy      Past Surgical History  History reviewed. No pertinent surgical history.      Allergies  No Known Allergies           Current Outpatient Medications on File Prior to Visit  Medication Sig Dispense Refill   DULoxetine (CYMBALTA) 60 MG DR capsule duloxetine 60 mg capsule,delayed release       lisinopriL (ZESTRIL) 5 MG tablet TAKE 1 TABLET (5 MG TOTAL) BY MOUTH DAILY. NEEDS  APPOINTMENT BEFORE NEXT REFILL        No current facility-administered medications on file prior to visit.      Family History       Family History  Problem Relation Age of Onset   Breast cancer Mother     Diabetes Father          Social History       Tobacco Use  Smoking Status Never Smoker  Smokeless Tobacco Never Used      Social History  Social History        Socioeconomic History   Marital status: Married  Tobacco Use   Smoking status: Never Smoker   Smokeless tobacco: Never Used  Building services engineer Use: Never used  Substance and Sexual Activity   Alcohol use: Yes   Drug use: Never   Sexual activity: Defer        Objective:         Vitals:    09/14/20 0906  Pulse: 101  Temp: 36.9 C (98.4 F)  SpO2: 97%  Weight: (!) 140.9 kg (310 lb 9.6 oz)  Height: 154.9 cm (5\' 1" )    Body mass index is 58.69 kg/m.   Gen: alert, NAD, non-toxic appearing, severe truncal obesity Pupils: equal, no scleral icterus Pulm: Lungs  clear to auscultation, symmetric chest rise CV: regular rate and rhythm Abd: soft, nontender, nondistended. old trocar sites. No cellulitis. No incisional hernia Ext: no edema,  Skin: no rash, no jaundice     Labs, Imaging and Diagnostic Testing: She had a prior upper GI which was unremarkable.  I reviewed the cardiology office note from March 2021, physical therapy clinic notes, lab prior office notes, and psychology evaluation from May   TSH was normal in May.  H. pylori negative.  Vitamin D level 36  Assessment and Plan:  Diagnoses and all orders for this visit:   Morbid (severe) obesity due to excess calories (CMS-HCC)   Fatty liver disease, nonalcoholic   Prediabetes   Dyslipidemia   Primary osteoarthritis of both knees   Mild obstructive sleep apnea   Lumbar radiculopathy   Essential hypertension   Gastroesophageal reflux disease, unspecified whether esophagitis present     The patient has completed the bariatric  surgery evaluation process.  We reviewed her work-up to date.  She has attended her preoperative education class.  We rediscussed the typical hospitalization as well as the typical recovery.  She reviewed and signed the surgical consent form today.  All of her questions were asked and answered.   This patient encounter took 30 minutes today to perform the following: take history, perform exam, review outside records, interpret imaging, counsel the patient on their diagnosis and document encounter, findings & plan in the EHR   No follow-ups on file.   [name]. Leydy Worthey MD FACS General, Minimally Invasive, & Bariatric Surgery

## 2020-09-14 NOTE — H&P (View-Only) (Signed)
PROVIDER:  Ashantia Amaral MCADAMS Leona Alen, MD   MRN: [id number] DOB: 03/20/1965 DATE OF ENCOUNTER: 09/14/2020 Subjective    Chief Complaint: Here to discuss surgery     History of Present Illness: Christine Ramirez is a 54 y.o. female who is seen today for severe obesity and related comorbidities.  I last saw her in the clinic in February.  She has completed the bariatric surgery evaluation process.   Her comorbidities include hypertension, fatty liver disease, bilateral knee osteoarthritis, prediabetes, dyslipidemia, vitamin D deficiency, mild obstructive sleep apnea, GERD   She completed a few physical therapy sessions.  She has received nutritional and psychological clearance.  She also saw cardiology in March and was felt to be low risk.   He denies any medical changes since I last saw her in February.  Her weight at that time was 316 pounds.  She had a remote upper GI in 2017 which was in normal limits.  An EKG from a few months ago was within normal limits.  She is accompanied by her husband.   Review of Systems: A complete review of systems was obtained from the patient.  I have reviewed this information and discussed as appropriate with the patient.  See HPI as well for other ROS.   ROS      Medical History: Past Medical History      Past Medical History:  Diagnosis Date   Anxiety     GERD (gastroesophageal reflux disease)             Patient Active Problem List  Diagnosis   Pain of both hip joints   Essential hypertension   Mixed hyperlipidemia   Morbid obesity (CMS-HCC)   Lumbar radiculopathy      Past Surgical History  History reviewed. No pertinent surgical history.      Allergies  No Known Allergies           Current Outpatient Medications on File Prior to Visit  Medication Sig Dispense Refill   DULoxetine (CYMBALTA) 60 MG DR capsule duloxetine 60 mg capsule,delayed release       lisinopriL (ZESTRIL) 5 MG tablet TAKE 1 TABLET (5 MG TOTAL) BY MOUTH DAILY. NEEDS  APPOINTMENT BEFORE NEXT REFILL        No current facility-administered medications on file prior to visit.      Family History       Family History  Problem Relation Age of Onset   Breast cancer Mother     Diabetes Father          Social History       Tobacco Use  Smoking Status Never Smoker  Smokeless Tobacco Never Used      Social History  Social History        Socioeconomic History   Marital status: Married  Tobacco Use   Smoking status: Never Smoker   Smokeless tobacco: Never Used  Vaping Use   Vaping Use: Never used  Substance and Sexual Activity   Alcohol use: Yes   Drug use: Never   Sexual activity: Defer        Objective:         Vitals:    09/14/20 0906  Pulse: 101  Temp: 36.9 C (98.4 F)  SpO2: 97%  Weight: (!) 140.9 kg (310 lb 9.6 oz)  Height: 154.9 cm (5' 1")    Body mass index is 58.69 kg/m.   Gen: alert, NAD, non-toxic appearing, severe truncal obesity Pupils: equal, no scleral icterus Pulm: Lungs   clear to auscultation, symmetric chest rise CV: regular rate and rhythm Abd: soft, nontender, nondistended. old trocar sites. No cellulitis. No incisional hernia Ext: no edema,  Skin: no rash, no jaundice     Labs, Imaging and Diagnostic Testing: She had a prior upper GI which was unremarkable.  I reviewed the cardiology office note from March 2021, physical therapy clinic notes, lab prior office notes, and psychology evaluation from May   TSH was normal in May.  H. pylori negative.  Vitamin D level 36  Assessment and Plan:  Diagnoses and all orders for this visit:   Morbid (severe) obesity due to excess calories (CMS-HCC)   Fatty liver disease, nonalcoholic   Prediabetes   Dyslipidemia   Primary osteoarthritis of both knees   Mild obstructive sleep apnea   Lumbar radiculopathy   Essential hypertension   Gastroesophageal reflux disease, unspecified whether esophagitis present     The patient has completed the bariatric  surgery evaluation process.  We reviewed her work-up to date.  She has attended her preoperative education class.  We rediscussed the typical hospitalization as well as the typical recovery.  She reviewed and signed the surgical consent form today.  All of her questions were asked and answered.   This patient encounter took 30 minutes today to perform the following: take history, perform exam, review outside records, interpret imaging, counsel the patient on their diagnosis and document encounter, findings & plan in the EHR   No follow-ups on file.   [name]. Camauri Craton MD FACS General, Minimally Invasive, & Bariatric Surgery

## 2020-09-17 MED ORDER — BUPIVACAINE LIPOSOME 1.3 % IJ SUSP
20.0000 mL | INTRAMUSCULAR | Status: DC
  Filled 2020-09-17: qty 20

## 2020-09-17 MED ORDER — SODIUM CHLORIDE 0.9 % IV SOLN
2.0000 g | INTRAVENOUS | Status: AC
  Administered 2020-09-18: 2 g via INTRAVENOUS
  Filled 2020-09-17: qty 2

## 2020-09-18 ENCOUNTER — Other Ambulatory Visit: Payer: Self-pay

## 2020-09-18 ENCOUNTER — Inpatient Hospital Stay (HOSPITAL_COMMUNITY): Payer: Federal, State, Local not specified - PPO | Admitting: Certified Registered Nurse Anesthetist

## 2020-09-18 ENCOUNTER — Encounter (HOSPITAL_COMMUNITY)
Admission: RE | Disposition: A | Discharge status: Home / Self Care | Payer: Self-pay | Source: Home / Self Care | Attending: General Surgery

## 2020-09-18 ENCOUNTER — Encounter (HOSPITAL_COMMUNITY): Payer: Self-pay | Admitting: General Surgery

## 2020-09-18 ENCOUNTER — Inpatient Hospital Stay (HOSPITAL_COMMUNITY)
Admission: RE | Admit: 2020-09-18 | Discharge: 2020-09-19 | DRG: 621 | Disposition: A | Discharge status: Home / Self Care | Payer: Federal, State, Local not specified - PPO | Attending: General Surgery | Admitting: General Surgery

## 2020-09-18 DIAGNOSIS — E782 Mixed hyperlipidemia: Secondary | ICD-10-CM | POA: Diagnosis not present

## 2020-09-18 DIAGNOSIS — M1990 Unspecified osteoarthritis, unspecified site: Secondary | ICD-10-CM | POA: Diagnosis present

## 2020-09-18 DIAGNOSIS — F419 Anxiety disorder, unspecified: Secondary | ICD-10-CM | POA: Diagnosis present

## 2020-09-18 DIAGNOSIS — M5416 Radiculopathy, lumbar region: Secondary | ICD-10-CM | POA: Diagnosis present

## 2020-09-18 DIAGNOSIS — Z6841 Body Mass Index (BMI) 40.0 and over, adult: Secondary | ICD-10-CM

## 2020-09-18 DIAGNOSIS — M170 Bilateral primary osteoarthritis of knee: Secondary | ICD-10-CM | POA: Diagnosis present

## 2020-09-18 DIAGNOSIS — I10 Essential (primary) hypertension: Secondary | ICD-10-CM | POA: Diagnosis present

## 2020-09-18 DIAGNOSIS — R7303 Prediabetes: Secondary | ICD-10-CM | POA: Diagnosis not present

## 2020-09-18 DIAGNOSIS — E559 Vitamin D deficiency, unspecified: Secondary | ICD-10-CM | POA: Diagnosis present

## 2020-09-18 DIAGNOSIS — K219 Gastro-esophageal reflux disease without esophagitis: Secondary | ICD-10-CM | POA: Diagnosis present

## 2020-09-18 DIAGNOSIS — Z79899 Other long term (current) drug therapy: Secondary | ICD-10-CM

## 2020-09-18 DIAGNOSIS — G4733 Obstructive sleep apnea (adult) (pediatric): Secondary | ICD-10-CM

## 2020-09-18 DIAGNOSIS — Z833 Family history of diabetes mellitus: Secondary | ICD-10-CM

## 2020-09-18 DIAGNOSIS — K760 Fatty (change of) liver, not elsewhere classified: Secondary | ICD-10-CM | POA: Diagnosis not present

## 2020-09-18 DIAGNOSIS — Z9884 Bariatric surgery status: Secondary | ICD-10-CM

## 2020-09-18 HISTORY — PX: GASTRIC ROUX-EN-Y: SHX5262

## 2020-09-18 LAB — TYPE AND SCREEN
ABO/RH(D): A POS
Antibody Screen: NEGATIVE

## 2020-09-18 LAB — HEMOGLOBIN AND HEMATOCRIT, BLOOD
HCT: 39.8 % (ref 36.0–46.0)
Hemoglobin: 12.4 g/dL (ref 12.0–15.0)

## 2020-09-18 LAB — HCG, SERUM, QUALITATIVE: Preg, Serum: NEGATIVE

## 2020-09-18 LAB — ABO/RH: ABO/RH(D): A POS

## 2020-09-18 SURGERY — LAPAROSCOPIC ROUX-EN-Y GASTRIC BYPASS WITH UPPER ENDOSCOPY
Anesthesia: General | Site: Abdomen

## 2020-09-18 MED ORDER — ROCURONIUM BROMIDE 10 MG/ML (PF) SYRINGE
PREFILLED_SYRINGE | INTRAVENOUS | Status: AC
  Filled 2020-09-18: qty 10

## 2020-09-18 MED ORDER — DEXAMETHASONE SODIUM PHOSPHATE 4 MG/ML IJ SOLN: 4.0000 mg | INTRAMUSCULAR | Status: DC

## 2020-09-18 MED ORDER — ONDANSETRON HCL 4 MG/2ML IJ SOLN
INTRAMUSCULAR | Status: DC | PRN
  Administered 2020-09-18: 4 mg via INTRAVENOUS

## 2020-09-18 MED ORDER — ACETAMINOPHEN 500 MG PO TABS
1000.0000 mg | ORAL_TABLET | ORAL | Status: AC
  Administered 2020-09-18: 1000 mg via ORAL
  Filled 2020-09-18: qty 2

## 2020-09-18 MED ORDER — OXYCODONE HCL 5 MG/5ML PO SOLN: 5.0000 mg | Freq: Once | ORAL | Status: DC | PRN

## 2020-09-18 MED ORDER — KETOROLAC TROMETHAMINE 30 MG/ML IJ SOLN: 30.0000 mg | Freq: Once | INTRAMUSCULAR | Status: AC | PRN

## 2020-09-18 MED ORDER — KCL IN DEXTROSE-NACL 20-5-0.45 MEQ/L-%-% IV SOLN
INTRAVENOUS | Status: DC
  Filled 2020-09-18 (×4): qty 1000

## 2020-09-18 MED ORDER — ORAL CARE MOUTH RINSE: 15.0000 mL | Freq: Once | OROMUCOSAL | Status: AC

## 2020-09-18 MED ORDER — LIDOCAINE 2% (20 MG/ML) 5 ML SYRINGE
INTRAMUSCULAR | Status: AC
  Filled 2020-09-18: qty 5

## 2020-09-18 MED ORDER — "VISTASEAL 4 ML SINGLE DOSE KIT "
4.0000 mL | PACK | Freq: Once | CUTANEOUS | Status: AC
  Administered 2020-09-18: 4 mL via TOPICAL
  Filled 2020-09-18: qty 4

## 2020-09-18 MED ORDER — SUGAMMADEX SODIUM 200 MG/2ML IV SOLN
INTRAVENOUS | Status: DC | PRN
  Administered 2020-09-18: 300 mg via INTRAVENOUS

## 2020-09-18 MED ORDER — CHLORHEXIDINE GLUCONATE 4 % EX LIQD: 60.0000 mL | Freq: Once | CUTANEOUS | Status: DC

## 2020-09-18 MED ORDER — SCOPOLAMINE 1 MG/3DAYS TD PT72
1.0000 | MEDICATED_PATCH | TRANSDERMAL | Status: DC
  Administered 2020-09-18: 1.5 mg via TRANSDERMAL
  Filled 2020-09-18: qty 1

## 2020-09-18 MED ORDER — OXYCODONE HCL 5 MG PO TABS: 5.0000 mg | ORAL_TABLET | Freq: Once | ORAL | Status: DC | PRN

## 2020-09-18 MED ORDER — PROPOFOL 10 MG/ML IV BOLUS
INTRAVENOUS | Status: DC | PRN
  Administered 2020-09-18: 200 mg via INTRAVENOUS

## 2020-09-18 MED ORDER — BUPIVACAINE LIPOSOME 1.3 % IJ SUSP
INTRAMUSCULAR | Status: DC | PRN
  Administered 2020-09-18: 20 mL

## 2020-09-18 MED ORDER — MORPHINE SULFATE (PF) 2 MG/ML IV SOLN: 1.0000 mg | INTRAVENOUS | Status: DC | PRN

## 2020-09-18 MED ORDER — LIDOCAINE HCL 2 % IJ SOLN
INTRAMUSCULAR | Status: AC
  Filled 2020-09-18: qty 20

## 2020-09-18 MED ORDER — LACTATED RINGERS IR SOLN
Status: DC | PRN
  Administered 2020-09-18: 1000 mL

## 2020-09-18 MED ORDER — LACTATED RINGERS IV SOLN: INTRAVENOUS | Status: DC

## 2020-09-18 MED ORDER — FIBRIN SEALANT 2 ML SINGLE DOSE KIT
2.0000 mL | PACK | Freq: Once | CUTANEOUS | Status: AC
  Administered 2020-09-18: 2 mL via TOPICAL
  Filled 2020-09-18: qty 2

## 2020-09-18 MED ORDER — KETAMINE HCL 10 MG/ML IJ SOLN
INTRAMUSCULAR | Status: DC | PRN
  Administered 2020-09-18: 30 mg via INTRAVENOUS

## 2020-09-18 MED ORDER — HYDRALAZINE HCL 20 MG/ML IJ SOLN: 10.0000 mg | INTRAMUSCULAR | Status: DC | PRN

## 2020-09-18 MED ORDER — DEXAMETHASONE SODIUM PHOSPHATE 10 MG/ML IJ SOLN
INTRAMUSCULAR | Status: DC | PRN
  Administered 2020-09-18: 6 mg via INTRAVENOUS

## 2020-09-18 MED ORDER — MIDAZOLAM HCL 5 MG/5ML IJ SOLN
INTRAMUSCULAR | Status: DC | PRN
  Administered 2020-09-18: 2 mg via INTRAVENOUS

## 2020-09-18 MED ORDER — HYDROMORPHONE HCL 1 MG/ML IJ SOLN
0.2500 mg | INTRAMUSCULAR | Status: DC | PRN
  Administered 2020-09-18: 0.5 mg via INTRAVENOUS

## 2020-09-18 MED ORDER — APREPITANT 40 MG PO CAPS
40.0000 mg | ORAL_CAPSULE | ORAL | Status: AC
  Administered 2020-09-18: 40 mg via ORAL
  Filled 2020-09-18: qty 1

## 2020-09-18 MED ORDER — DEXAMETHASONE SODIUM PHOSPHATE 10 MG/ML IJ SOLN
INTRAMUSCULAR | Status: AC
  Filled 2020-09-18: qty 1

## 2020-09-18 MED ORDER — ENSURE MAX PROTEIN PO LIQD
2.0000 [oz_av] | ORAL | Status: DC
  Administered 2020-09-19 (×4): 2 [oz_av] via ORAL

## 2020-09-18 MED ORDER — GABAPENTIN 100 MG PO CAPS
200.0000 mg | ORAL_CAPSULE | Freq: Two times a day (BID) | ORAL | Status: DC
  Administered 2020-09-18 (×2): 200 mg via ORAL
  Filled 2020-09-18 (×2): qty 2

## 2020-09-18 MED ORDER — LIDOCAINE 2% (20 MG/ML) 5 ML SYRINGE
INTRAMUSCULAR | Status: DC | PRN
  Administered 2020-09-18: 1.5 mg/kg/h via INTRAVENOUS

## 2020-09-18 MED ORDER — CHLORHEXIDINE GLUCONATE 0.12 % MT SOLN
15.0000 mL | Freq: Once | OROMUCOSAL | Status: AC
  Administered 2020-09-18: 15 mL via OROMUCOSAL

## 2020-09-18 MED ORDER — GABAPENTIN 300 MG PO CAPS
300.0000 mg | ORAL_CAPSULE | ORAL | Status: AC
Start: 2020-09-18 — End: 2020-09-18
  Administered 2020-09-18: 300 mg via ORAL
  Filled 2020-09-18: qty 1

## 2020-09-18 MED ORDER — ACETAMINOPHEN 160 MG/5ML PO SOLN: 1000.0000 mg | Freq: Three times a day (TID) | ORAL | Status: DC

## 2020-09-18 MED ORDER — ONDANSETRON HCL 4 MG/2ML IJ SOLN: 4.0000 mg | Freq: Four times a day (QID) | INTRAMUSCULAR | Status: DC | PRN

## 2020-09-18 MED ORDER — ACETAMINOPHEN 500 MG PO TABS
1000.0000 mg | ORAL_TABLET | Freq: Three times a day (TID) | ORAL | Status: DC
  Administered 2020-09-18 – 2020-09-19 (×4): 1000 mg via ORAL
  Filled 2020-09-18 (×4): qty 2

## 2020-09-18 MED ORDER — HYDROMORPHONE HCL 1 MG/ML IJ SOLN
INTRAMUSCULAR | Status: AC
  Administered 2020-09-18: 0.5 mg via INTRAVENOUS
  Filled 2020-09-18: qty 2

## 2020-09-18 MED ORDER — ONDANSETRON HCL 4 MG/2ML IJ SOLN: 4.0000 mg | Freq: Once | INTRAMUSCULAR | Status: AC | PRN

## 2020-09-18 MED ORDER — 0.9 % SODIUM CHLORIDE (POUR BTL) OPTIME
TOPICAL | Status: DC | PRN
  Administered 2020-09-18: 1000 mL

## 2020-09-18 MED ORDER — DIPHENHYDRAMINE HCL 50 MG/ML IJ SOLN: 12.5000 mg | Freq: Three times a day (TID) | INTRAMUSCULAR | Status: DC | PRN

## 2020-09-18 MED ORDER — FENTANYL CITRATE (PF) 250 MCG/5ML IJ SOLN
INTRAMUSCULAR | Status: DC | PRN
  Administered 2020-09-18 (×3): 50 ug via INTRAVENOUS
  Administered 2020-09-18: 100 ug via INTRAVENOUS

## 2020-09-18 MED ORDER — PROPOFOL 10 MG/ML IV BOLUS
INTRAVENOUS | Status: AC
  Filled 2020-09-18: qty 20

## 2020-09-18 MED ORDER — ONDANSETRON HCL 4 MG/2ML IJ SOLN
INTRAMUSCULAR | Status: AC
  Administered 2020-09-18: 4 mg via INTRAVENOUS
  Filled 2020-09-18: qty 2

## 2020-09-18 MED ORDER — HEPARIN SODIUM (PORCINE) 5000 UNIT/ML IJ SOLN
5000.0000 [IU] | INTRAMUSCULAR | Status: AC
  Administered 2020-09-18: 5000 [IU] via SUBCUTANEOUS
  Filled 2020-09-18: qty 1

## 2020-09-18 MED ORDER — ENOXAPARIN SODIUM 30 MG/0.3ML IJ SOSY
30.0000 mg | PREFILLED_SYRINGE | Freq: Two times a day (BID) | INTRAMUSCULAR | Status: DC
  Administered 2020-09-18 – 2020-09-19 (×2): 30 mg via SUBCUTANEOUS
  Filled 2020-09-18 (×3): qty 0.3

## 2020-09-18 MED ORDER — OXYCODONE HCL 5 MG/5ML PO SOLN: 5.0000 mg | Freq: Four times a day (QID) | ORAL | Status: DC | PRN

## 2020-09-18 MED ORDER — SODIUM CHLORIDE 0.9 % IV SOLN
12.5000 mg | Freq: Four times a day (QID) | INTRAVENOUS | Status: DC | PRN
  Filled 2020-09-18: qty 0.5

## 2020-09-18 MED ORDER — LISINOPRIL 5 MG PO TABS
5.0000 mg | ORAL_TABLET | Freq: Every day | ORAL | Status: DC
  Administered 2020-09-18: 5 mg via ORAL
  Filled 2020-09-18: qty 1

## 2020-09-18 MED ORDER — ROCURONIUM BROMIDE 10 MG/ML (PF) SYRINGE
PREFILLED_SYRINGE | INTRAVENOUS | Status: DC | PRN
  Administered 2020-09-18 (×2): 30 mg via INTRAVENOUS
  Administered 2020-09-18: 70 mg via INTRAVENOUS

## 2020-09-18 MED ORDER — PANTOPRAZOLE SODIUM 40 MG IV SOLR
40.0000 mg | Freq: Every day | INTRAVENOUS | Status: DC
  Administered 2020-09-18: 40 mg via INTRAVENOUS
  Filled 2020-09-18: qty 40

## 2020-09-18 MED ORDER — KETAMINE HCL 10 MG/ML IJ SOLN
INTRAMUSCULAR | Status: AC
  Filled 2020-09-18: qty 1

## 2020-09-18 MED ORDER — SIMETHICONE 80 MG PO CHEW: 80.0000 mg | CHEWABLE_TABLET | Freq: Four times a day (QID) | ORAL | Status: DC | PRN

## 2020-09-18 MED ORDER — SODIUM CHLORIDE (PF) 0.9 % IJ SOLN
INTRAMUSCULAR | Status: DC | PRN
  Administered 2020-09-18: 50 mL

## 2020-09-18 MED ORDER — ONDANSETRON HCL 4 MG/2ML IJ SOLN
INTRAMUSCULAR | Status: AC
  Filled 2020-09-18: qty 2

## 2020-09-18 MED ORDER — FENTANYL CITRATE (PF) 250 MCG/5ML IJ SOLN
INTRAMUSCULAR | Status: AC
  Filled 2020-09-18: qty 5

## 2020-09-18 MED ORDER — SUGAMMADEX SODIUM 500 MG/5ML IV SOLN
INTRAVENOUS | Status: AC
  Filled 2020-09-18: qty 5

## 2020-09-18 MED ORDER — LIDOCAINE 2% (20 MG/ML) 5 ML SYRINGE
INTRAMUSCULAR | Status: DC | PRN
  Administered 2020-09-18: 100 mg via INTRAVENOUS

## 2020-09-18 MED ORDER — MIDAZOLAM HCL 2 MG/2ML IJ SOLN
INTRAMUSCULAR | Status: AC
  Filled 2020-09-18: qty 2

## 2020-09-18 MED ORDER — PHENYLEPHRINE HCL-NACL 10-0.9 MG/250ML-% IV SOLN
INTRAVENOUS | Status: DC | PRN
  Administered 2020-09-18: 35 ug/min via INTRAVENOUS

## 2020-09-18 MED ORDER — KETOROLAC TROMETHAMINE 30 MG/ML IJ SOLN
INTRAMUSCULAR | Status: AC
  Administered 2020-09-18: 30 mg via INTRAVENOUS
  Filled 2020-09-18: qty 1

## 2020-09-18 MED ORDER — PHENYLEPHRINE 40 MCG/ML (10ML) SYRINGE FOR IV PUSH (FOR BLOOD PRESSURE SUPPORT)
PREFILLED_SYRINGE | INTRAVENOUS | Status: DC | PRN
  Administered 2020-09-18: 80 ug via INTRAVENOUS
  Administered 2020-09-18: 120 ug via INTRAVENOUS

## 2020-09-18 SURGICAL SUPPLY — 82 items
APL LAPSCP 35 DL APL RGD (MISCELLANEOUS) ×2
APL PRP STRL LF DISP 70% ISPRP (MISCELLANEOUS) ×2
APL SWBSTK 6 STRL LF DISP (MISCELLANEOUS)
APPLICATOR COTTON TIP 6 STRL (MISCELLANEOUS) IMPLANT
APPLICATOR COTTON TIP 6IN STRL (MISCELLANEOUS)
APPLICATOR VISTASEAL 35 (MISCELLANEOUS) ×4 IMPLANT
APPLIER CLIP ROT 13.4 12 LRG (CLIP)
APR CLP LRG 13.4X12 ROT 20 MLT (CLIP)
BAG COUNTER SPONGE SURGICOUNT (BAG) IMPLANT
BAG SPNG CNTER NS LX DISP (BAG)
BLADE SURG SZ11 CARB STEEL (BLADE) ×2 IMPLANT
BNDG ADH 1X3 SHEER STRL LF (GAUZE/BANDAGES/DRESSINGS) IMPLANT
BNDG ADH THN 3X1 STRL LF (GAUZE/BANDAGES/DRESSINGS)
CABLE HIGH FREQUENCY MONO STRZ (ELECTRODE) IMPLANT
CHLORAPREP W/TINT 26 (MISCELLANEOUS) ×4 IMPLANT
CLIP APPLIE ROT 13.4 12 LRG (CLIP) IMPLANT
CLIP SUT LAPRA TY ABSORB (SUTURE) ×4 IMPLANT
CUTTER FLEX LINEAR 45M (STAPLE) IMPLANT
DEVICE SUT QUICK LOAD TK 5 (STAPLE) IMPLANT
DEVICE SUT TI-KNOT TK 5X26 (MISCELLANEOUS) IMPLANT
DEVICE SUTURE ENDOST 10MM (ENDOMECHANICALS) ×2 IMPLANT
DRAIN PENROSE 0.25X18 (DRAIN) ×2 IMPLANT
DRSG TEGADERM 2-3/8X2-3/4 SM (GAUZE/BANDAGES/DRESSINGS) ×2 IMPLANT
ELECT REM PT RETURN 15FT ADLT (MISCELLANEOUS) ×2 IMPLANT
GAUZE 4X4 16PLY ~~LOC~~+RFID DBL (SPONGE) ×2 IMPLANT
GAUZE SPONGE 2X2 8PLY STRL LF (GAUZE/BANDAGES/DRESSINGS) ×1 IMPLANT
GAUZE SPONGE 4X4 12PLY STRL (GAUZE/BANDAGES/DRESSINGS) IMPLANT
GLOVE SRG 8 PF TXTR STRL LF DI (GLOVE) ×1 IMPLANT
GLOVE SURG POLY ORTHO LF SZ7.5 (GLOVE) ×2 IMPLANT
GLOVE SURG UNDER POLY LF SZ8 (GLOVE) ×2
GOWN STRL REUS W/TWL XL LVL3 (GOWN DISPOSABLE) ×8 IMPLANT
KIT BASIN OR (CUSTOM PROCEDURE TRAY) ×2 IMPLANT
KIT GASTRIC LAVAGE 34FR ADT (SET/KITS/TRAYS/PACK) ×2 IMPLANT
KIT TURNOVER KIT A (KITS) ×2 IMPLANT
MARKER SKIN DUAL TIP RULER LAB (MISCELLANEOUS) ×2 IMPLANT
MAT PREVALON FULL STRYKER (MISCELLANEOUS) ×2 IMPLANT
NEEDLE SPNL 22GX3.5 QUINCKE BK (NEEDLE) ×2 IMPLANT
PACK CARDIOVASCULAR III (CUSTOM PROCEDURE TRAY) ×2 IMPLANT
PENCIL SMOKE EVACUATOR (MISCELLANEOUS) IMPLANT
RELOAD 45 VASCULAR/THIN (ENDOMECHANICALS) IMPLANT
RELOAD ENDO STITCH 2.0 (ENDOMECHANICALS) ×18
RELOAD STAPLE 45 2.5 WHT GRN (ENDOMECHANICALS) IMPLANT
RELOAD STAPLE 60 2.6 WHT THN (STAPLE) ×2 IMPLANT
RELOAD STAPLE 60 3.6 BLU REG (STAPLE) ×2 IMPLANT
RELOAD STAPLE 60 3.8 GOLD REG (STAPLE) ×1 IMPLANT
RELOAD STAPLE TA45 3.5 REG BLU (ENDOMECHANICALS) IMPLANT
RELOAD STAPLER BLUE 60MM (STAPLE) ×4 IMPLANT
RELOAD STAPLER GOLD 60MM (STAPLE) IMPLANT
RELOAD STAPLER WHITE 60MM (STAPLE) ×2 IMPLANT
RELOAD SUT SNGL STCH ABSRB 2-0 (ENDOMECHANICALS) ×5 IMPLANT
RELOAD SUT SNGL STCH BLK 2-0 (ENDOMECHANICALS) ×4 IMPLANT
SCISSORS LAP 5X45 EPIX DISP (ENDOMECHANICALS) ×2 IMPLANT
SET IRRIG TUBING LAPAROSCOPIC (IRRIGATION / IRRIGATOR) ×2 IMPLANT
SET TUBE SMOKE EVAC HIGH FLOW (TUBING) ×2 IMPLANT
SHEARS HARMONIC ACE PLUS 45CM (MISCELLANEOUS) ×2 IMPLANT
SLEEVE XCEL OPT CAN 5 100 (ENDOMECHANICALS) ×2 IMPLANT
SOL ANTI FOG 6CC (MISCELLANEOUS) ×1 IMPLANT
SOLUTION ANTI FOG 6CC (MISCELLANEOUS) ×1
SPONGE GAUZE 2X2 STER 10/PKG (GAUZE/BANDAGES/DRESSINGS) ×1
STAPLER ECHELON BIOABSB 60 FLE (MISCELLANEOUS) IMPLANT
STAPLER ECHELON LONG 60 440 (INSTRUMENTS) ×2 IMPLANT
STAPLER RELOAD BLUE 60MM (STAPLE) ×8
STAPLER RELOAD GOLD 60MM (STAPLE)
STAPLER RELOAD WHITE 60MM (STAPLE) ×4
STRIP CLOSURE SKIN 1/2X4 (GAUZE/BANDAGES/DRESSINGS) ×12 IMPLANT
SURGILUBE 2OZ TUBE FLIPTOP (MISCELLANEOUS) ×2 IMPLANT
SUT MNCRL AB 4-0 PS2 18 (SUTURE) ×2 IMPLANT
SUT RELOAD ENDO STITCH 2 48X1 (ENDOMECHANICALS) ×5
SUT RELOAD ENDO STITCH 2.0 (ENDOMECHANICALS) ×4
SUT SURGIDAC NAB ES-9 0 48 120 (SUTURE) IMPLANT
SUT VIC AB 2-0 SH 27 (SUTURE) ×2
SUT VIC AB 2-0 SH 27X BRD (SUTURE) ×1 IMPLANT
SUTURE RELOAD END STTCH 2 48X1 (ENDOMECHANICALS) ×5 IMPLANT
SUTURE RELOAD ENDO STITCH 2.0 (ENDOMECHANICALS) ×4 IMPLANT
SYR 20ML LL LF (SYRINGE) ×4 IMPLANT
TOWEL OR 17X26 10 PK STRL BLUE (TOWEL DISPOSABLE) ×2 IMPLANT
TOWEL OR NON WOVEN STRL DISP B (DISPOSABLE) ×2 IMPLANT
TRAY FOLEY MTR SLVR 16FR STAT (SET/KITS/TRAYS/PACK) IMPLANT
TROCAR BLADELESS OPT 5 100 (ENDOMECHANICALS) ×2 IMPLANT
TROCAR UNIVERSAL OPT 12M 100M (ENDOMECHANICALS) ×6 IMPLANT
TROCAR XCEL 12X100 BLDLESS (ENDOMECHANICALS) ×2 IMPLANT
TUBING CONNECTING 10 (TUBING) ×4 IMPLANT

## 2020-09-18 NOTE — Op Note (Signed)
Preoperative diagnosis: Roux-en-Y gastric bypass  Postoperative diagnosis: Same   Procedure: Upper endoscopy   Surgeon: Berna Bue, M.D.  Anesthesia: Gen.   Description of procedure: The endoscope was placed in the mouth and oropharynx and under endoscopic vision it was advanced to the esophagogastric junction which was identified at 36cm from the teeth. The pouch was tensely insufflated while the upper abdomen was flooded with irrigation and the Roux limb clamped with a bowel clamp by the operative surgeon to perform a leak test, which was negative. No bubbles were seen.  The staple line and anastomosis were hemostatic, the anastomosis is visibly patent.  The pouch is approximately 6 cm in length.  The lumen was decompressed and the scope was withdrawn without difficulty.    Berna Bue, M.D. General, Bariatric, & Minimally Invasive Surgery [facility]

## 2020-09-18 NOTE — Anesthesia Preprocedure Evaluation (Signed)
Anesthesia Evaluation  Patient identified by MRN, date of birth, ID band Patient awake    Reviewed: Allergy & Precautions, NPO status , Patient's Chart, lab work & pertinent test results  History of Anesthesia Complications (+) PONV  Airway Mallampati: II  TM Distance: <3 FB Neck ROM: Full    Dental no notable dental hx.    Pulmonary neg pulmonary ROS,    Pulmonary exam normal breath sounds clear to auscultation       Cardiovascular hypertension, Pt. on medications Normal cardiovascular exam Rhythm:Regular Rate:Normal     Neuro/Psych Anxiety Depression negative neurological ROS     GI/Hepatic Neg liver ROS, GERD  ,  Endo/Other  Morbid obesity  Renal/GU negative Renal ROS  negative genitourinary   Musculoskeletal negative musculoskeletal ROS (+)   Abdominal (+) + obese,   Peds negative pediatric ROS (+)  Hematology negative hematology ROS (+)   Anesthesia Other Findings   Reproductive/Obstetrics negative OB ROS                             Anesthesia Physical Anesthesia Plan  ASA: 3  Anesthesia Plan: General   Post-op Pain Management:    Induction: Intravenous  PONV Risk Score and Plan: 4 or greater and Ondansetron, Dexamethasone, Midazolam, Scopolamine patch - Pre-op and Treatment may vary due to age or medical condition  Airway Management Planned: Oral ETT  Additional Equipment:   Intra-op Plan:   Post-operative Plan: Extubation in OR  Informed Consent: I have reviewed the patients History and Physical, chart, labs and discussed the procedure including the risks, benefits and alternatives for the proposed anesthesia with the patient or authorized representative who has indicated his/her understanding and acceptance.     Dental advisory given  Plan Discussed with: CRNA and Surgeon  Anesthesia Plan Comments:         Anesthesia Quick Evaluation

## 2020-09-18 NOTE — Anesthesia Procedure Notes (Signed)
Procedure Name: Intubation Date/Time: 09/18/2020 7:39 AM Performed by: Maxwell Caul, CRNA Pre-anesthesia Checklist: Patient identified, Emergency Drugs available, Suction available and Patient being monitored Patient Re-evaluated:Patient Re-evaluated prior to induction Oxygen Delivery Method: Circle system utilized Preoxygenation: Pre-oxygenation with 100% oxygen Induction Type: IV induction Ventilation: Mask ventilation without difficulty and Oral airway inserted - appropriate to patient size Laryngoscope Size: Mac and 4 Grade View: Grade I Tube type: Oral Tube size: 7.5 mm Number of attempts: 1 Airway Equipment and Method: Stylet and Oral airway Placement Confirmation: ETT inserted through vocal cords under direct vision, positive ETCO2 and breath sounds checked- equal and bilateral Secured at: 21 cm Tube secured with: Tape Dental Injury: Teeth and Oropharynx as per pre-operative assessment

## 2020-09-18 NOTE — Interval H&P Note (Signed)
History and Physical Interval Note:  09/18/2020 7:27 AM  [name]  has presented today for surgery, with the diagnosis of MORBID OBESITY.  The various methods of treatment have been discussed with the patient and family. After consideration of risks, benefits and other options for treatment, the patient has consented to  Procedure(s): LAPAROSCOPIC ROUX-EN-Y GASTRIC BYPASS WITH UPPER ENDOSCOPY (N/A) as a surgical intervention.  The patient's history has been reviewed, patient examined, no change in status, stable for surgery.  I have reviewed the patient's chart and labs.  Questions were answered to the patient's satisfaction.     Christine Ramirez

## 2020-09-18 NOTE — Progress Notes (Signed)

## 2020-09-18 NOTE — Transfer of Care (Signed)
Immediate Anesthesia Transfer of Care Note  Patient: Christine Ramirez  Procedure(s) Performed: LAPAROSCOPIC ROUX-EN-Y GASTRIC BYPASS WITH UPPER ENDOSCOPY (Abdomen)  Patient Location: PACU  Anesthesia Type:General  Level of Consciousness: awake, alert  and oriented  Airway & Oxygen Therapy: Patient Spontanous Breathing and Patient connected to face mask oxygen  Post-op Assessment: Report given to RN and Post -op Vital signs reviewed and stable  Post vital signs: Reviewed and stable  Last Vitals:  Vitals Value Taken Time  BP 150/92 09/18/20 1024  Temp    Pulse 82 09/18/20 1026  Resp 14 09/18/20 1026  SpO2 98 % 09/18/20 1026  Vitals shown include unvalidated device data.  Last Pain:  Vitals:   09/18/20 0611  TempSrc:   PainSc: 0-No pain         Complications: No notable events documented.

## 2020-09-18 NOTE — Op Note (Signed)
Christine Ramirez [id number] Jul 14, 1965. 09/18/2020  Preoperative diagnosis:    Morbid obesity BMI 58   Essential hypertension   Osteoarthritis   Mixed hyperlipidemia   OSA (obstructive sleep apnea) - mild   Prediabetes   Lumbar radiculopathy  Postoperative  diagnosis:  1. same  Surgical procedure: Laparoscopic Roux-en-Y gastric bypass (ante-colic, ante-gastric); upper endoscopy  Surgeon: Atilano Ina, M.D. FACS  Asst.: Phylliss Blakes MD FACS  Anesthesia: General plus exparel/marcaine mix  Complications: None   EBL: Minimal   Drains: None   Disposition: PACU in good condition   Indications for procedure: 55 y.o. yo female with morbid obesity who has been unsuccessful at sustained weight loss. The patient's comorbidities are listed above. We discussed the risk and benefits of surgery including but not limited to anesthesia risk, bleeding, infection, blood clot formation, anastomotic leak, anastomotic stricture, ulcer formation, death, respiratory complications, intestinal blockage, internal hernia, gallstone formation, vitamin and nutritional deficiencies, injury to surrounding structures, failure to lose weight and mood changes.   Description of procedure: Patient is brought to the operating room and general anesthesia induced. The patient had received preoperative broad-spectrum IV antibiotics and subcutaneous heparin. The abdomen was widely sterilely prepped with Chloraprep and draped. Patient timeout was performed and correct patient and procedure confirmed. Access was obtained with a 12 mm Optiview trocar in the left upper quadrant and pneumoperitoneum established without difficulty. Under direct vision 12 mm trocars were placed laterally in the right upper quadrant, right upper quadrant midclavicular line, and to the left and above the umbilicus for the camera port. A 5 mm trocar was placed laterally in the left upper quadrant.  I took down some omental adhesions to the  anterior abdominal wall. Exparel/marcaine mix was infiltrated in bilateral lateral abdominal walls as a TAP block.  The omentum was brought into the upper abdomen after freeing it from colonic epipolic appendages and the transverse mesocolon elevated and the ligament of Treitz clearly identified. A 50 cm biliopancreatic limb was then carefully measured from the ligament of Treitz. The small intestine was divided at this point with a single firing of the white load linear stapler. A Penrose drain was sutured to the end of the Roux-en-Y limb for later identification. A 100 cm Roux-en-Y limb was then carefully measured. At this point a side-to-side anastomosis was created between the Roux limb and the end of the biliopancreatic limb. This was accomplished with a single firing of the 60 mm white load linear stapler. The common enterotomy was closed with a running 2-0 Vicryl begun at either end of the enterotomy and tied centrally. Vistaseal tissue sealant was placed over the anastomosis. The mesenteric defect was then closed with running 2-0 silk. The omentum was then divided with the harmonic scalpel up towards the transverse colon to allow mobility of the Roux limb toward the gastric pouch. The patient was then placed in steep reversed Trendelenburg. Through a 5 mm subxiphoid site the [facility] retractor was placed and the left lobe of the liver elevated with excellent exposure of the upper stomach and hiatus. The angle of Hiss was then mobilized with the harmonic scalpel. A 5 cm gastric pouch was then carefully measured along the lesser curve of the stomach. Dissection was carried along the lesser curve at this point with the Harmonic scalpel working carefully back toward the lesser sac at right angles to the lesser curve. The free lesser sac was then entered. After being sure all tubes were removed from the stomach an initial  firing of the gold load 60 mm linear stapler was fired at right angles across the lesser  curve for about 4 cm. The gastric pouch was further mobilized posteriorly and then the pouch was completed with 3 further firings of the 60 mm blue load linear stapler up through the previously dissected angle of His. It was ensured that the pouch was completely mobilized away from the gastric remnant. This created a nice tubular 5 cm gastric pouch. The Roux limb was then brought up in an antecolic fashion with the candycane facing to the patient's left without undue tension. The gastrojejunostomy was created with an initial posterior row of 2-0 Vicryl between the Roux limb and the staple line of the gastric pouch. Enterotomies were then made in the gastric pouch and the Roux limb with the harmonic scalpel and at approximately 2-2-1/2 cm anastomosis was created with a single firing of the 56mm blue load linear stapler. The staple line was inspected and was intact without bleeding. The common enterotomy was then closed with running 2-0 Vicryl begun at either end and tied centrally. The Ewall tube was then easily passed through the anastomosis and an outer anterior layer of running 2-0 Vicryl was placed. The Ewald tube was removed. With the outlet of the gastrojejunostomy clamped and under saline irrigation the assistant performed upper endoscopy and with the gastric pouch tensely distended with air-there was no evidence of leak on this test. The pouch was desufflated. The Vonita Moss defect was closed with running 2-0 silk. The abdomen was inspected for any evidence of bleeding or bowel injury and everything looked fine. The Nathanson retractor was removed under direct vision after coating the anastomosis with Vistaseal tissue sealant. All CO2 was evacuated and trochars removed. Skin incisions were closed with 4-0 monocryl in a subcuticular fashion followed by  steri-strips and bandages. Sponge needle and instrument counts were correct. The patient was taken to the PACU in good condition.    [name]. Andrey Campanile, MD,  FACS General, Bariatric, & Minimally [facility], [place]

## 2020-09-18 NOTE — Progress Notes (Signed)
PHARMACY CONSULT FOR:  Risk Assessment for Post-Discharge VTE Following Bariatric Surgery  Post-Discharge VTE Risk Assessment: This patient's probability of 30-day post-discharge VTE is increased due to the factors marked:   Female    Age >/=60 years  X  BMI >/=50 kg/m2    CHF    Dyspnea at Rest    Paraplegia  X  Non-gastric-band surgery    Operation Time >/=3 hr    Return to OR     Length of Stay >/= 3 d   Hx of VTE   Hypercoagulable condition   Significant venous stasis   Predicted probability of 30-day post-discharge VTE: 0.27%, Mild   Other patient-specific factors to consider: None  Recommendation for Discharge: No pharmacologic prophylaxis post-discharge      Christine Ramirez is a 55 y.o. female who underwent  laparoscopic Roux-en-Y gastric bypass on 7/26.     Case start: 0810 Case end: 1015   No Known Allergies  Patient Measurements: Height: 5\' 1"  (154.9 cm) Weight: (!) 139.4 kg (307 lb 4 oz) IBW/kg (Calculated) : 47.8 Body mass index is 58.05 kg/m.  Recent Labs    09/18/20 1038  HGB 12.4  HCT 39.8   Estimated Creatinine Clearance: 105.9 mL/min (by C-G formula based on SCr of 0.73 mg/dL).    Past Medical History:  Diagnosis Date   Anemia    Anxiety    Arthritis    osteo knees   COPD (chronic obstructive pulmonary disease) (HCC)    chronic bronchitis   Depression    Fatty liver    GERD (gastroesophageal reflux disease)    H/O typhoid fever    age 55   Headache    Heart murmur    as a child   Hepatitis    age 33   Hyperlipidemia    Hypertension    Jaundice    age 62   Pneumonia    in past   PONV (postoperative nausea and vomiting)    Scarlet fever    age 1   Shortness of breath dyspnea      Medications Prior to Admission  Medication Sig Dispense Refill Last Dose   DULoxetine (CYMBALTA) 60 MG capsule TAKE 1 CAPSULE BY MOUTH EVERY DAY (Patient taking differently: Take 60 mg by mouth at bedtime.) 90 capsule 3 09/17/2020   lisinopril  (ZESTRIL) 5 MG tablet Take 1 tablet (5 mg total) by mouth daily. Needs appointment before next refill (Patient taking differently: Take 5 mg by mouth at bedtime.) 90 tablet 3 09/17/2020   acetaminophen (TYLENOL) 500 MG tablet Take 1,000 mg by mouth every 6 (six) hours as needed for moderate pain.    More than a month   ondansetron (ZOFRAN) 4 MG tablet Take 1 tablet (4 mg total) by mouth every 8 (eight) hours as needed for nausea or vomiting. (Patient not taking: Reported on 09/11/2020) 40 tablet 1 Not Taking   09/13/2020 PharmD, BCPS Clinical Pharmacist [facility] main pharmacy [phone] 09/18/2020 12:02 PM

## 2020-09-18 NOTE — Discharge Instructions (Signed)

## 2020-09-18 NOTE — Anesthesia Postprocedure Evaluation (Signed)
Anesthesia Post Note  Patient: Christine Ramirez  Procedure(s) Performed: LAPAROSCOPIC ROUX-EN-Y GASTRIC BYPASS WITH UPPER ENDOSCOPY (Abdomen)     Patient location during evaluation: PACU Anesthesia Type: General Level of consciousness: awake and alert Pain management: pain level controlled Vital Signs Assessment: post-procedure vital signs reviewed and stable Respiratory status: spontaneous breathing, nonlabored ventilation, respiratory function stable and patient connected to nasal cannula oxygen Cardiovascular status: blood pressure returned to baseline and stable Postop Assessment: no apparent nausea or vomiting Anesthetic complications: no   No notable events documented.  Last Vitals:  Vitals:   09/18/20 1345 09/18/20 1430  BP: 119/81 137/85  Pulse: 66 70  Resp: 18 19  Temp: 36.4 C (!) 36.3 C  SpO2: 91% 93%    Last Pain:  Vitals:   09/18/20 1430  TempSrc: Oral  PainSc:                  [name] S

## 2020-09-19 ENCOUNTER — Encounter (HOSPITAL_COMMUNITY): Payer: Self-pay | Admitting: General Surgery

## 2020-09-19 LAB — CBC WITH DIFFERENTIAL/PLATELET
Abs Immature Granulocytes: 0.04 10*3/uL (ref 0.00–0.07)
Basophils Absolute: 0 10*3/uL (ref 0.0–0.1)
Basophils Relative: 0 %
Eosinophils Absolute: 0 10*3/uL (ref 0.0–0.5)
Eosinophils Relative: 0 %
HCT: 36.2 % (ref 36.0–46.0)
Hemoglobin: 11.1 g/dL — ABNORMAL LOW (ref 12.0–15.0)
Immature Granulocytes: 0 %
Lymphocytes Relative: 8 %
Lymphs Abs: 0.9 10*3/uL (ref 0.7–4.0)
MCH: 27.7 pg (ref 26.0–34.0)
MCHC: 30.7 g/dL (ref 30.0–36.0)
MCV: 90.3 fL (ref 80.0–100.0)
Monocytes Absolute: 0.7 10*3/uL (ref 0.1–1.0)
Monocytes Relative: 7 %
Neutro Abs: 9.1 10*3/uL — ABNORMAL HIGH (ref 1.7–7.7)
Neutrophils Relative %: 85 %
Platelets: 269 10*3/uL (ref 150–400)
RBC: 4.01 MIL/uL (ref 3.87–5.11)
RDW: 15.7 % — ABNORMAL HIGH (ref 11.5–15.5)
WBC: 10.7 10*3/uL — ABNORMAL HIGH (ref 4.0–10.5)
nRBC: 0 % (ref 0.0–0.2)

## 2020-09-19 LAB — COMPREHENSIVE METABOLIC PANEL
ALT: 62 U/L — ABNORMAL HIGH (ref 0–44)
AST: 38 U/L (ref 15–41)
Albumin: 3.6 g/dL (ref 3.5–5.0)
Alkaline Phosphatase: 47 U/L (ref 38–126)
Anion gap: 8 (ref 5–15)
BUN: 11 mg/dL (ref 6–20)
CO2: 27 mmol/L (ref 22–32)
Calcium: 9.3 mg/dL (ref 8.9–10.3)
Chloride: 107 mmol/L (ref 98–111)
Creatinine, Ser: 0.63 mg/dL (ref 0.44–1.00)
GFR, Estimated: >60 mL/min (ref >60)
Glucose, Bld: 140 mg/dL — ABNORMAL HIGH (ref 70–99)
Potassium: 4.9 mmol/L (ref 3.5–5.1)
Sodium: 142 mmol/L (ref 135–145)
Total Bilirubin: 0.3 mg/dL (ref 0.3–1.2)
Total Protein: 6.7 g/dL (ref 6.5–8.1)

## 2020-09-19 MED ORDER — PANTOPRAZOLE SODIUM 40 MG PO TBEC: 40.0000 mg | DELAYED_RELEASE_TABLET | Freq: Every day | ORAL | 0 refills | Status: DC

## 2020-09-19 MED ORDER — ACETAMINOPHEN 500 MG PO TABS: 1000.0000 mg | ORAL_TABLET | Freq: Four times a day (QID) | ORAL | 0 refills | Status: AC

## 2020-09-19 MED ORDER — TRAMADOL HCL 50 MG PO TABS: 50.0000 mg | ORAL_TABLET | Freq: Four times a day (QID) | ORAL | 0 refills | Status: DC | PRN

## 2020-09-19 MED ORDER — ONDANSETRON 4 MG PO TBDP: 4.0000 mg | ORAL_TABLET | Freq: Four times a day (QID) | ORAL | 0 refills | Status: DC | PRN

## 2020-09-19 MED ORDER — LIP MEDEX EX OINT
TOPICAL_OINTMENT | CUTANEOUS | Status: AC
  Filled 2020-09-19: qty 7

## 2020-09-19 MED ORDER — ENOXAPARIN (LOVENOX) PATIENT EDUCATION KIT
PACK | Freq: Once | Status: DC
  Filled 2020-09-19: qty 1

## 2020-09-19 MED ORDER — ENOXAPARIN SODIUM 40 MG/0.4ML IJ SOSY: 40.0000 mg | PREFILLED_SYRINGE | Freq: Two times a day (BID) | INTRAMUSCULAR | 0 refills | Status: DC

## 2020-09-19 NOTE — Progress Notes (Signed)
Nutrition Education Note ° °Received consult for diet education for patient s/p bariatric surgery. ° °Discussed 2 week post op diet with pt. Emphasized that liquids must be non carbonated, non caffeinated, and sugar free. Fluid goals discussed. Pt to follow up with outpatient bariatric RD for further diet progression after 2 weeks. Multivitamins and minerals also reviewed. Teach back method used, pt expressed understanding, expect good compliance. ° °If nutrition issues arise, please consult RD. ° °Christine Heisler, MS, RD, LDN °Inpatient Clinical Dietitian °Contact information available via Amion ° ° °

## 2020-09-19 NOTE — Progress Notes (Signed)
Patient alert and oriented, pain is controlled. Patient is tolerating fluids, advanced to protein shake today, patient is tolerating well. Reviewed Gastric Bypass discharge instructions with patient and patient is able to articulate understanding. Provided information on BELT program, Support Group and [facility] outpatient pharmacy. Discussed Lovenox injections after discharged.  Pt watched Lovenox self-injection video.All questions answered, will continue to monitor.

## 2020-09-19 NOTE — Discharge Summary (Signed)
Physician Discharge Summary  TIERNEY BEHL IEP:[id number] DOB: Sep 06, 1965 DOA: 09/18/2020  PCP: Myrlene Broker, MD  Admit date: 09/18/2020 Discharge date: 09/19/2020  Recommendations for Outpatient Follow-up:     Follow-up Information     Gaynelle Adu, MD. Go on 10/10/2020.   Specialty: General Surgery Why: at 9:30am.  Please arrive 15 minutes prior to your appointment time.  Thank you. Contact information: [address] [place] [address] [phone]         Hedda Slade, PA-C. Go on 11/06/2020.   Specialty: General Surgery Why: at 2:15pm for Dr. Andrey Campanile.  Please arrive 15 minutes prior to your appointment time.  Thank you. Contact information: [address] [place] [address] [phone]                Discharge Diagnoses:  Principal Problem:   Morbid obesity (HCC) Active Problems:   Essential hypertension   Osteoarthritis   Mixed hyperlipidemia   OSA (obstructive sleep apnea) - mild   Prediabetes   Lumbar radiculopathy   Surgical Procedure: Laparoscopic Roux-en-Y gastric bypass, upper endoscopy  Discharge Condition: Good Disposition: Home  Diet recommendation: Postoperative gastric bypass diet  Filed Weights   09/18/20 0531 09/18/20 0603  Weight: (!) 138.8 kg (!) 139.4 kg     Hospital Course:  The patient was admitted for a planned laparoscopic Roux-en-Y gastric bypass. Please see operative note. Preoperatively the patient was given 5000 units of subcutaneous heparin for DVT prophylaxis. ERAS protocol was used. Postoperative prophylactic Lovenox dosing was started on the evening of postoperative day 0.  The patient was started on ice chips and water on the evening of POD 0 which they tolerated. On postoperative day 1 The patient's diet was advanced to protein shakes which they also tolerated. On POD 1, The patient was ambulating without difficulty. Their vital signs are stable without fever or tachycardia. We  stopped her schduled gabapentin bc of drowsiness. Their hemoglobin had remained stable. The patient had received discharge instructions and counseling. They were deemed stable for discharge.  I did send the pt out on BID prophylactic lovenox for dvt risk reduction bc of her mobility.   BP 133/83 (BP Location: Left Arm)   Pulse 64   Temp 98 F (36.7 C)   Resp 18   Ht 5\' 1"  (1.549 m)   Wt (!) 139.4 kg   SpO2 100%   BMI 58.05 kg/m   Gen: alert, NAD, non-toxic appearing Pupils: equal, no scleral icterus Pulm: Lungs clear to auscultation, symmetric chest rise CV: regular rate and rhythm Abd: soft, min tender, nondistended. No cellulitis. No incisional hernia Ext: no edema, no calf tenderness Skin: no rash, no jaundice  Discharge Instructions  Discharge Instructions     Ambulate hourly while awake   Complete by: As directed    Call MD for:  difficulty breathing, headache or visual disturbances   Complete by: As directed    Call MD for:  persistant dizziness or light-headedness   Complete by: As directed    Call MD for:  persistant nausea and vomiting   Complete by: As directed    Call MD for:  redness, tenderness, or signs of infection (pain, swelling, redness, odor or green/yellow discharge around incision site)   Complete by: As directed    Call MD for:  severe uncontrolled pain   Complete by: As directed    Call MD for:  temperature >101 F   Complete by: As directed  Diet bariatric full liquid   Complete by: As directed    Discharge instructions   Complete by: As directed    See bariatric discharge instructions   Incentive spirometry   Complete by: As directed    Perform hourly while awake      Allergies as of 09/19/2020   No Known Allergies      Medication List     STOP taking these medications    ondansetron 4 MG tablet Commonly known as: Zofran       TAKE these medications    acetaminophen 500 MG tablet Commonly known as: TYLENOL Take 2 tablets  (1,000 mg total) by mouth every 6 (six) hours for 5 days. What changed:  when to take this reasons to take this   enoxaparin 40 MG/0.4ML injection Commonly known as: LOVENOX Inject 0.4 mLs (40 mg total) into the skin 2 (two) times daily for 14 days.   ondansetron 4 MG disintegrating tablet Commonly known as: ZOFRAN-ODT Take 1 tablet (4 mg total) by mouth every 6 (six) hours as needed for nausea or vomiting.   pantoprazole 40 MG tablet Commonly known as: PROTONIX Take 1 tablet (40 mg total) by mouth daily.   traMADol 50 MG tablet Commonly known as: ULTRAM Take 1 tablet (50 mg total) by mouth every 6 (six) hours as needed (pain).       ASK your doctor about these medications    DULoxetine 60 MG capsule Commonly known as: CYMBALTA TAKE 1 CAPSULE BY MOUTH EVERY DAY   lisinopril 5 MG tablet Commonly known as: ZESTRIL Take 1 tablet (5 mg total) by mouth daily. Needs appointment before next refill        Follow-up Information     Gaynelle Adu, MD. Go on 10/10/2020.   Specialty: General Surgery Why: at 9:30am.  Please arrive 15 minutes prior to your appointment time.  Thank you. Contact information: [address] [place] [address] [phone]         Hedda Slade, PA-C. Go on 11/06/2020.   Specialty: General Surgery Why: at 2:15pm for Dr. Andrey Campanile.  Please arrive 15 minutes prior to your appointment time.  Thank you. Contact information: [address] [place] [address] [phone]                  The results of significant diagnostics from this hospitalization (including imaging, microbiology, ancillary and laboratory) are listed below for reference.    Significant Diagnostic Studies: No results found.  Labs: Basic Metabolic Panel: Recent Labs  Lab 09/14/20 1059 09/19/20 0426  NA 140 142  K 4.8 4.9  CL 106 107  CO2 26 27  GLUCOSE 98 140*  BUN 17 11  CREATININE 0.73 0.63  CALCIUM 9.4 9.3   Liver Function  Tests: Recent Labs  Lab 09/14/20 1059 09/19/20 0426  AST 20 38  ALT 22 62*  ALKPHOS 54 47  BILITOT 0.5 0.3  PROT 7.7 6.7  ALBUMIN 4.1 3.6    CBC: Recent Labs  Lab 09/14/20 1059 09/18/20 1038 09/19/20 0426  WBC 8.3  --  10.7*  NEUTROABS 6.2  --  9.1*  HGB 12.6 12.4 11.1*  HCT 40.8 39.8 36.2  MCV 91.5  --  90.3  PLT 302  --  269    CBG: No results for input(s): GLUCAP in the last 168 hours.  Principal Problem:   Morbid obesity (HCC) Active Problems:   Essential hypertension   Osteoarthritis  Mixed hyperlipidemia   OSA (obstructive sleep apnea) - mild   Prediabetes   Lumbar radiculopathy   Time coordinating discharge: 20 min  Signed:  Atilano Ina, MD [facility], [place] [phone] 09/19/2020, 1:18 PM

## 2020-09-20 NOTE — Progress Notes (Signed)
24hr fluid recall prior to discharge: .  Per dehydration protocol, will call pt to f/u within one week post op.

## 2020-09-21 ENCOUNTER — Other Ambulatory Visit (HOSPITAL_COMMUNITY): Payer: Self-pay

## 2020-09-21 ENCOUNTER — Telehealth (HOSPITAL_COMMUNITY): Payer: Self-pay | Admitting: *Deleted

## 2020-09-24 NOTE — Telephone Encounter (Addendum)
1.  Tell me about your pain and pain management? Pt states that she has a constant discomfort on the left side of her umbilicus that is a 4/10.  Pt states that it feels like "gas or constipation pain".  Pt has been taking tramadol with some relief (but only has one left), so pt is currently only taking one tylenol since she has not had a BM yet.  Pt encouraged to take two tylenol prn as needed to see if it helps with pain.  Pt encouraged to call surgeon if pain worsens or needs prescription refill.  2.  Let's talk about fluid intake.  How much total fluid are you taking in? Pt states that she is working to meet goal of 64 oz of fluid today.  Pt states that she is getting average of 40oz of fluid daily by using 2oz measuring cups to measure intake.  Pt encouraged to prioritize protein intake and increase fluids as tolerated to meet goals.  Pt instructed to assess status and suggestions daily utilizing Hydration Action Plan on discharge folder and to call [facility] if in the "red zone".   3.  How much protein have you taken in the last 2 days? Pt states that she is working to meet goal of goal of 60g of protein today.  Pt has already consumed 1/2 protein shake.  Pt plans to drink remainder of protein throughout the rest of the day to meet goal.  Discussed increasing fluid intake with measuring cups, to try and sip each cup within 10-15 minutes as tolerated versus every 30 minutes.    4.  Have you had nausea?  Tell me about when have experienced nausea and what you did to help? Pt currently denies nausea.   5.  Has the frequency or color changed with your urine? Pt states that she is urinating "fine" with no changes in frequency or urgency.  Pt states that she has some bleeding, but this has been occurring prior to surgery due to her being perimenopausal.     6.  Tell me what your incisions look like? "Incisions look fine, I just have one that is bruised". Pt denies a fever, chills.  Pt states incisions are  not swollen, open, or draining.  Pt encouraged to call [facility] if incisions change.   7.  Have you been passing gas? BM? Pt states that she has not had a BM.  Pt received instructions to take Miralax from surgeon's office.  Pt to call surgeon's office if not able to have BM with medication.   8.  If a problem or question were to arise who would you call?  Do you know contact numbers for [facility], [facility], and [facility]? Pt denies dehydration symptoms.  Pt can describe s/sx of dehydration.  Pt knows to call [facility] for surgical, [facility] for nutrition, and [facility] for non-urgent questions or concerns.   9.  How has the walking going? Pt states she is walking around and able to be active without difficulty.   10. Are you still using your incentive spirometer?  If so, how often? Pt states that she is using her I.S. every hour, but did share that she didn't do it as much yesterday. Pt encouraged to use incentive spirometer, at least 10x every hour while awake until s/he sees the surgeon.  11.  How are your vitamins and calcium going?  How are you taking them? Pt states that she is taking her supplements and vitamins without difficulty.  Pt  stated that she is doing well with the Lovenox injections twice daily.  Reminded patient that the first 30 days post-operatively are important for successful recovery.  Practice good hand hygiene, wearing a mask when appropriate (since optional in most places), and minimizing exposure to people who live outside of the home, especially if they are exhibiting any respiratory, GI, or illness-like symptoms.

## 2020-09-25 ENCOUNTER — Telehealth: Payer: Self-pay | Admitting: Internal Medicine

## 2020-09-25 ENCOUNTER — Other Ambulatory Visit: Payer: Self-pay | Admitting: General Surgery

## 2020-09-25 ENCOUNTER — Telehealth (HOSPITAL_COMMUNITY): Payer: Self-pay | Admitting: *Deleted

## 2020-09-25 NOTE — Telephone Encounter (Signed)
Pt called with concerns of frequent loose stools since yesterday and intolerance to PO intake.  Pt states that she has only been able to consume one protein shake and two ounces of water since 0800.  Pt c/o fatigue and nausea.  Pt denies fever, chills. Called [facility] triage and spoke with Dr. Andrey Campanile.  IVF ordered for pt. Office to f/u with pt with details for IVF appointment.  Pt called and informed about IVF orders.  Pt encouraged to continue to drink fluids as tolerated.  Will continue to monitor as needed.

## 2020-09-26 ENCOUNTER — Other Ambulatory Visit: Payer: Self-pay

## 2020-09-26 ENCOUNTER — Ambulatory Visit
Admission: RE | Admit: 2020-09-26 | Discharge: 2020-09-26 | Disposition: A | Discharge status: Home / Self Care | Payer: Federal, State, Local not specified - PPO | Source: Ambulatory Visit | Attending: General Surgery | Admitting: General Surgery

## 2020-09-26 DIAGNOSIS — R1012 Left upper quadrant pain: Secondary | ICD-10-CM | POA: Insufficient documentation

## 2020-09-26 LAB — CBC WITH DIFFERENTIAL/PLATELET
Abs Immature Granulocytes: 0.05 10*3/uL (ref 0.00–0.07)
Basophils Absolute: 0 10*3/uL (ref 0.0–0.1)
Basophils Relative: 0 %
Eosinophils Absolute: 0.2 10*3/uL (ref 0.0–0.5)
Eosinophils Relative: 3 %
HCT: 36 % (ref 36.0–46.0)
Hemoglobin: 11.6 g/dL — ABNORMAL LOW (ref 12.0–15.0)
Immature Granulocytes: 1 %
Lymphocytes Relative: 15 %
Lymphs Abs: 1.5 10*3/uL (ref 0.7–4.0)
MCH: 28.2 pg (ref 26.0–34.0)
MCHC: 32.2 g/dL (ref 30.0–36.0)
MCV: 87.6 fL (ref 80.0–100.0)
Monocytes Absolute: 0.9 10*3/uL (ref 0.1–1.0)
Monocytes Relative: 9 %
Neutro Abs: 6.9 10*3/uL (ref 1.7–7.7)
Neutrophils Relative %: 72 %
Platelets: 296 10*3/uL (ref 150–400)
RBC: 4.11 MIL/uL (ref 3.87–5.11)
RDW: 15.4 % (ref 11.5–15.5)
WBC: 9.6 10*3/uL (ref 4.0–10.5)
nRBC: 0 % (ref 0.0–0.2)

## 2020-09-26 LAB — COMPREHENSIVE METABOLIC PANEL
ALT: 28 U/L (ref 0–44)
AST: 19 U/L (ref 15–41)
Albumin: 3.5 g/dL (ref 3.5–5.0)
Alkaline Phosphatase: 56 U/L (ref 38–126)
Anion gap: 9 (ref 5–15)
BUN: 14 mg/dL (ref 6–20)
CO2: 23 mmol/L (ref 22–32)
Calcium: 8.9 mg/dL (ref 8.9–10.3)
Chloride: 101 mmol/L (ref 98–111)
Creatinine, Ser: 0.77 mg/dL (ref 0.44–1.00)
GFR, Estimated: >60 mL/min (ref >60)
Glucose, Bld: 104 mg/dL — ABNORMAL HIGH (ref 70–99)
Potassium: 3.6 mmol/L (ref 3.5–5.1)
Sodium: 133 mmol/L — ABNORMAL LOW (ref 135–145)
Total Bilirubin: 0.8 mg/dL (ref 0.3–1.2)
Total Protein: 6.9 g/dL (ref 6.5–8.1)

## 2020-09-26 LAB — MAGNESIUM: Magnesium: 2 mg/dL (ref 1.7–2.4)

## 2020-09-26 MED ORDER — M.V.I. ADULT IV INJ
INJECTION | Freq: Once | INTRAVENOUS | Status: AC
  Filled 2020-09-26: qty 1000

## 2020-09-26 MED ORDER — SODIUM CHLORIDE 0.9 % IV BOLUS
1000.0000 mL | Freq: Once | INTRAVENOUS | Status: AC
Start: 2020-09-26 — End: 2020-09-26
  Administered 2020-09-26: 1000 mL via INTRAVENOUS

## 2020-09-27 ENCOUNTER — Telehealth (HOSPITAL_COMMUNITY): Payer: Self-pay | Admitting: *Deleted

## 2020-09-27 NOTE — Telephone Encounter (Signed)
Called to f/u with patient after receiving IVF.  Pt states that she is "feeling much better". Pt states that she has been able to do more walking, but is still having some pain in knees/legs (baseline for pt).  Pt is working to meet protein/fluid goals (has had one protein shake and 1/2 Gatorade today). Pt does c/o intermittent 5-6/10 sharp pain on the left side of the abdomen, especially when deep breathing and moving.  Pt encouraged to take prescribed pain medication.  Pt instructed to call Dr. Andrey Campanile if pain medication doesn't alleviate pain or pain worsens. Will continue to follow up as needed.

## 2020-09-27 NOTE — Telephone Encounter (Signed)
See below

## 2020-09-28 ENCOUNTER — Encounter (HOSPITAL_COMMUNITY): Payer: Self-pay | Admitting: Emergency Medicine

## 2020-09-28 ENCOUNTER — Telehealth: Payer: Self-pay | Admitting: General Surgery

## 2020-09-28 ENCOUNTER — Emergency Department (HOSPITAL_COMMUNITY): Payer: Federal, State, Local not specified - PPO

## 2020-09-28 ENCOUNTER — Inpatient Hospital Stay (HOSPITAL_COMMUNITY)
Admission: EM | Admit: 2020-09-28 | Discharge: 2020-10-02 | DRG: 381 | Disposition: A | Discharge status: Home / Self Care | Payer: Federal, State, Local not specified - PPO | Attending: General Surgery | Admitting: General Surgery

## 2020-09-28 ENCOUNTER — Other Ambulatory Visit: Payer: Self-pay

## 2020-09-28 DIAGNOSIS — N83201 Unspecified ovarian cyst, right side: Secondary | ICD-10-CM | POA: Diagnosis not present

## 2020-09-28 DIAGNOSIS — Z8249 Family history of ischemic heart disease and other diseases of the circulatory system: Secondary | ICD-10-CM

## 2020-09-28 DIAGNOSIS — Z8701 Personal history of pneumonia (recurrent): Secondary | ICD-10-CM

## 2020-09-28 DIAGNOSIS — R1012 Left upper quadrant pain: Secondary | ICD-10-CM | POA: Diagnosis not present

## 2020-09-28 DIAGNOSIS — R7303 Prediabetes: Secondary | ICD-10-CM | POA: Diagnosis present

## 2020-09-28 DIAGNOSIS — K5900 Constipation, unspecified: Secondary | ICD-10-CM | POA: Diagnosis present

## 2020-09-28 DIAGNOSIS — K760 Fatty (change of) liver, not elsewhere classified: Secondary | ICD-10-CM | POA: Diagnosis present

## 2020-09-28 DIAGNOSIS — Z8261 Family history of arthritis: Secondary | ICD-10-CM

## 2020-09-28 DIAGNOSIS — G4733 Obstructive sleep apnea (adult) (pediatric): Secondary | ICD-10-CM | POA: Diagnosis present

## 2020-09-28 DIAGNOSIS — I700 Atherosclerosis of aorta: Secondary | ICD-10-CM | POA: Diagnosis not present

## 2020-09-28 DIAGNOSIS — Z8616 Personal history of COVID-19: Secondary | ICD-10-CM

## 2020-09-28 DIAGNOSIS — Z6841 Body Mass Index (BMI) 40.0 and over, adult: Secondary | ICD-10-CM

## 2020-09-28 DIAGNOSIS — Z79899 Other long term (current) drug therapy: Secondary | ICD-10-CM

## 2020-09-28 DIAGNOSIS — K219 Gastro-esophageal reflux disease without esophagitis: Secondary | ICD-10-CM | POA: Diagnosis present

## 2020-09-28 DIAGNOSIS — Z8619 Personal history of other infectious and parasitic diseases: Secondary | ICD-10-CM

## 2020-09-28 DIAGNOSIS — R1010 Upper abdominal pain, unspecified: Secondary | ICD-10-CM | POA: Diagnosis not present

## 2020-09-28 DIAGNOSIS — M170 Bilateral primary osteoarthritis of knee: Secondary | ICD-10-CM | POA: Diagnosis present

## 2020-09-28 DIAGNOSIS — K289 Gastrojejunal ulcer, unspecified as acute or chronic, without hemorrhage or perforation: Principal | ICD-10-CM | POA: Diagnosis present

## 2020-09-28 DIAGNOSIS — I10 Essential (primary) hypertension: Secondary | ICD-10-CM | POA: Diagnosis present

## 2020-09-28 DIAGNOSIS — J449 Chronic obstructive pulmonary disease, unspecified: Secondary | ICD-10-CM | POA: Diagnosis present

## 2020-09-28 DIAGNOSIS — Z818 Family history of other mental and behavioral disorders: Secondary | ICD-10-CM

## 2020-09-28 DIAGNOSIS — F419 Anxiety disorder, unspecified: Secondary | ICD-10-CM | POA: Diagnosis present

## 2020-09-28 DIAGNOSIS — E782 Mixed hyperlipidemia: Secondary | ICD-10-CM | POA: Diagnosis present

## 2020-09-28 DIAGNOSIS — F32A Depression, unspecified: Secondary | ICD-10-CM | POA: Diagnosis present

## 2020-09-28 DIAGNOSIS — N83202 Unspecified ovarian cyst, left side: Secondary | ICD-10-CM | POA: Diagnosis present

## 2020-09-28 DIAGNOSIS — R109 Unspecified abdominal pain: Secondary | ICD-10-CM | POA: Diagnosis not present

## 2020-09-28 DIAGNOSIS — Z20822 Contact with and (suspected) exposure to covid-19: Secondary | ICD-10-CM | POA: Diagnosis present

## 2020-09-28 DIAGNOSIS — M5416 Radiculopathy, lumbar region: Secondary | ICD-10-CM | POA: Diagnosis present

## 2020-09-28 DIAGNOSIS — Z9884 Bariatric surgery status: Secondary | ICD-10-CM

## 2020-09-28 LAB — COMPREHENSIVE METABOLIC PANEL
ALT: 26 U/L (ref 0–44)
AST: 19 U/L (ref 15–41)
Albumin: 3.8 g/dL (ref 3.5–5.0)
Alkaline Phosphatase: 55 U/L (ref 38–126)
Anion gap: 10 (ref 5–15)
BUN: 11 mg/dL (ref 6–20)
CO2: 24 mmol/L (ref 22–32)
Calcium: 8.9 mg/dL (ref 8.9–10.3)
Chloride: 105 mmol/L (ref 98–111)
Creatinine, Ser: 0.68 mg/dL (ref 0.44–1.00)
GFR, Estimated: >60 mL/min (ref >60)
Glucose, Bld: 94 mg/dL (ref 70–99)
Potassium: 4.3 mmol/L (ref 3.5–5.1)
Sodium: 139 mmol/L (ref 135–145)
Total Bilirubin: 0.6 mg/dL (ref 0.3–1.2)
Total Protein: 7.3 g/dL (ref 6.5–8.1)

## 2020-09-28 LAB — CBC WITH DIFFERENTIAL/PLATELET
Abs Immature Granulocytes: 0.06 10*3/uL (ref 0.00–0.07)
Basophils Absolute: 0 10*3/uL (ref 0.0–0.1)
Basophils Relative: 0 %
Eosinophils Absolute: 0.2 10*3/uL (ref 0.0–0.5)
Eosinophils Relative: 2 %
HCT: 38.4 % (ref 36.0–46.0)
Hemoglobin: 11.8 g/dL — ABNORMAL LOW (ref 12.0–15.0)
Immature Granulocytes: 1 %
Lymphocytes Relative: 16 %
Lymphs Abs: 1.3 10*3/uL (ref 0.7–4.0)
MCH: 27.6 pg (ref 26.0–34.0)
MCHC: 30.7 g/dL (ref 30.0–36.0)
MCV: 89.7 fL (ref 80.0–100.0)
Monocytes Absolute: 0.6 10*3/uL (ref 0.1–1.0)
Monocytes Relative: 7 %
Neutro Abs: 5.9 10*3/uL (ref 1.7–7.7)
Neutrophils Relative %: 74 %
Platelets: 303 10*3/uL (ref 150–400)
RBC: 4.28 MIL/uL (ref 3.87–5.11)
RDW: 16 % — ABNORMAL HIGH (ref 11.5–15.5)
WBC: 8 10*3/uL (ref 4.0–10.5)
nRBC: 0 % (ref 0.0–0.2)

## 2020-09-28 LAB — URINALYSIS, ROUTINE W REFLEX MICROSCOPIC
Bilirubin Urine: NEGATIVE
Glucose, UA: NEGATIVE mg/dL
Ketones, ur: 80 mg/dL — AB
Nitrite: NEGATIVE
Protein, ur: 100 mg/dL — AB
RBC / HPF: >50 RBC/hpf — ABNORMAL HIGH (ref 0–5)
Specific Gravity, Urine: <1.005 — ABNORMAL LOW (ref 1.005–1.030)
pH: 6 (ref 5.0–8.0)

## 2020-09-28 LAB — LIPASE, BLOOD: Lipase: 69 U/L — ABNORMAL HIGH (ref 11–51)

## 2020-09-28 LAB — I-STAT BETA HCG BLOOD, ED (MC, WL, AP ONLY): I-stat hCG, quantitative: <5 m[IU]/mL (ref <5)

## 2020-09-28 LAB — LACTIC ACID, PLASMA: Lactic Acid, Venous: 2 mmol/L (ref 0.5–1.9)

## 2020-09-28 MED ORDER — SUCRALFATE 1 GM/10ML PO SUSP
1.0000 g | Freq: Three times a day (TID) | ORAL | Status: DC
  Administered 2020-09-28 – 2020-10-02 (×14): 1 g via ORAL
  Filled 2020-09-28 (×16): qty 10

## 2020-09-28 MED ORDER — ONDANSETRON HCL 4 MG/2ML IJ SOLN
4.0000 mg | Freq: Four times a day (QID) | INTRAMUSCULAR | Status: DC | PRN
  Administered 2020-09-29 – 2020-09-30 (×2): 4 mg via INTRAVENOUS
  Filled 2020-09-28 (×2): qty 2

## 2020-09-28 MED ORDER — OXYCODONE HCL 5 MG/5ML PO SOLN
5.0000 mg | ORAL | Status: DC | PRN
Start: 2020-09-28 — End: 2020-10-02
  Administered 2020-09-29 (×2): 5 mg via ORAL
  Filled 2020-09-28 (×2): qty 5

## 2020-09-28 MED ORDER — MORPHINE SULFATE (PF) 2 MG/ML IV SOLN: 1.0000 mg | INTRAVENOUS | Status: DC | PRN

## 2020-09-28 MED ORDER — ONDANSETRON 8 MG PO TBDP
8.0000 mg | ORAL_TABLET | Freq: Once | ORAL | Status: AC
  Administered 2020-09-28: 8 mg via ORAL
  Filled 2020-09-28: qty 1

## 2020-09-28 MED ORDER — FENTANYL CITRATE (PF) 100 MCG/2ML IJ SOLN
50.0000 ug | Freq: Once | INTRAMUSCULAR | Status: DC
Start: 2020-09-28 — End: 2020-09-28
  Filled 2020-09-28: qty 2

## 2020-09-28 MED ORDER — ONDANSETRON 4 MG PO TBDP
4.0000 mg | ORAL_TABLET | Freq: Four times a day (QID) | ORAL | Status: DC | PRN
  Administered 2020-09-29 – 2020-10-01 (×2): 4 mg via ORAL
  Filled 2020-09-28 (×2): qty 1

## 2020-09-28 MED ORDER — ENSURE MAX PROTEIN PO LIQD
11.0000 [oz_av] | Freq: Two times a day (BID) | ORAL | Status: DC
  Administered 2020-09-29 – 2020-10-02 (×6): 11 [oz_av] via ORAL
  Filled 2020-09-28 (×4): qty 330

## 2020-09-28 MED ORDER — SUCRALFATE 1 G PO TABS
1.0000 g | ORAL_TABLET | Freq: Once | ORAL | Status: AC
  Administered 2020-09-28: 1 g via ORAL
  Filled 2020-09-28: qty 1

## 2020-09-28 MED ORDER — HYDROMORPHONE HCL 1 MG/ML IJ SOLN
1.0000 mg | Freq: Once | INTRAMUSCULAR | Status: AC
Start: 2020-09-28 — End: 2020-09-28
  Administered 2020-09-28: 1 mg via INTRAVENOUS
  Filled 2020-09-28: qty 1

## 2020-09-28 MED ORDER — ENOXAPARIN SODIUM 30 MG/0.3ML IJ SOSY
30.0000 mg | PREFILLED_SYRINGE | Freq: Two times a day (BID) | INTRAMUSCULAR | Status: DC
  Administered 2020-09-28 – 2020-10-02 (×8): 30 mg via SUBCUTANEOUS
  Filled 2020-09-28 (×8): qty 0.3

## 2020-09-28 MED ORDER — POLYETHYLENE GLYCOL 3350 17 G PO PACK
17.0000 g | PACK | Freq: Every day | ORAL | Status: DC
  Administered 2020-09-29 – 2020-09-30 (×2): 17 g via ORAL
  Filled 2020-09-28 (×2): qty 1

## 2020-09-28 MED ORDER — PANTOPRAZOLE SODIUM 40 MG IV SOLR
40.0000 mg | Freq: Two times a day (BID) | INTRAVENOUS | Status: DC
  Administered 2020-09-28 – 2020-10-01 (×6): 40 mg via INTRAVENOUS
  Filled 2020-09-28 (×6): qty 40

## 2020-09-28 MED ORDER — DIPHENHYDRAMINE HCL 12.5 MG/5ML PO ELIX
12.5000 mg | ORAL_SOLUTION | Freq: Four times a day (QID) | ORAL | Status: DC | PRN
  Administered 2020-09-30: 12.5 mg via ORAL
  Filled 2020-09-28: qty 5

## 2020-09-28 MED ORDER — SIMETHICONE 80 MG PO CHEW
40.0000 mg | CHEWABLE_TABLET | Freq: Four times a day (QID) | ORAL | Status: DC | PRN
  Administered 2020-09-30: 40 mg via ORAL
  Filled 2020-09-28: qty 1

## 2020-09-28 MED ORDER — IOHEXOL 350 MG/ML SOLN
80.0000 mL | Freq: Once | INTRAVENOUS | Status: AC | PRN
  Administered 2020-09-28: 80 mL via INTRAVENOUS

## 2020-09-28 MED ORDER — SODIUM CHLORIDE 0.9 % IV SOLN: Freq: Once | INTRAVENOUS | Status: DC

## 2020-09-28 MED ORDER — DIPHENHYDRAMINE HCL 50 MG/ML IJ SOLN: 12.5000 mg | Freq: Four times a day (QID) | INTRAMUSCULAR | Status: DC | PRN

## 2020-09-28 MED ORDER — ACETAMINOPHEN 500 MG PO TABS
1000.0000 mg | ORAL_TABLET | Freq: Three times a day (TID) | ORAL | Status: DC
  Administered 2020-09-28 – 2020-10-02 (×10): 1000 mg via ORAL
  Filled 2020-09-28 (×11): qty 2

## 2020-09-28 MED ORDER — DULOXETINE HCL 60 MG PO CPEP
60.0000 mg | ORAL_CAPSULE | Freq: Every day | ORAL | Status: DC
  Administered 2020-09-28 – 2020-10-01 (×4): 60 mg via ORAL
  Filled 2020-09-28 (×2): qty 2
  Filled 2020-09-28 (×2): qty 1

## 2020-09-28 MED ORDER — KCL IN DEXTROSE-NACL 20-5-0.45 MEQ/L-%-% IV SOLN
INTRAVENOUS | Status: DC
  Filled 2020-09-28 (×11): qty 1000

## 2020-09-28 MED ORDER — LACTATED RINGERS IV BOLUS
1000.0000 mL | Freq: Once | INTRAVENOUS | Status: AC
  Administered 2020-09-28: 1000 mL via INTRAVENOUS

## 2020-09-28 MED ORDER — LISINOPRIL 5 MG PO TABS
5.0000 mg | ORAL_TABLET | Freq: Every day | ORAL | Status: DC
  Administered 2020-09-28 – 2020-10-01 (×3): 5 mg via ORAL
  Filled 2020-09-28 (×3): qty 1

## 2020-09-28 MED ORDER — IOHEXOL 9 MG/ML PO SOLN
ORAL | Status: AC
  Filled 2020-09-28: qty 500

## 2020-09-28 NOTE — ED Provider Notes (Signed)
[facility]-EMERGENCY DEPT Provider Note   CSN: [id number] Arrival date & time: 09/28/20  [id number]     History No chief complaint on file.   Christine Ramirez is a 55 y.o. female.  HPI     55 year old female with history of gastric bypass surgery last week comes in with chief complaint of abdominal discomfort.  She is also reporting blood in the urine.  Patient reports that after the surgery she has had difficulty with BM.  She has only had a small BM, and has felt bloated.  She had gone for hydration at the bariatric clinic couple of days ago.  She continues to feel bloated.  She is passing flatus.  Her pain is worse with any kind of p.o. intake.  Pain is mostly on the right side.  There is also pain with inspiration.  She is taking Lovenox for PE prophylaxis.  She denies any shortness of breath at rest.  Past Medical History:  Diagnosis Date   Anemia    Anxiety    Arthritis    osteo knees   COPD (chronic obstructive pulmonary disease) (HCC)    chronic bronchitis   Depression    Fatty liver    GERD (gastroesophageal reflux disease)    H/O typhoid fever    age 89   Headache    Heart murmur    as a child   Hepatitis    age 89   Hyperlipidemia    Hypertension    Jaundice    age 89   Pneumonia    in past   PONV (postoperative nausea and vomiting)    Scarlet fever    age 89   Shortness of breath dyspnea     Patient Active Problem List   Diagnosis Date Noted   OSA (obstructive sleep apnea) - mild 09/18/2020   Prediabetes 09/18/2020   Lumbar radiculopathy 09/18/2020   Mixed hyperlipidemia 05/14/2020   Heart murmur 05/14/2020   COVID 03/09/2020   Routine general medical examination at a health care facility 07/10/2017   Chronic diarrhea 02/28/2016   Osteoarthritis 09/11/2015   Morbid obesity (HCC) 12/15/2014   Essential hypertension 12/15/2014   Gastro-esophageal reflux disease without esophagitis 07/03/2011    Past Surgical History:   Procedure Laterality Date   ABDOMINAL EXPLORATION SURGERY  1975   age 38 at [facility]   APPENDECTOMY     CHOLECYSTECTOMY N/A 03/19/2015   Procedure: LAPAROSCOPIC CHOLECYSTECTOMY WITH INTRAOPERATIVE CHOLANGIOGRAM;  Surgeon: Gaynelle AduEric Wilson, MD;  Location: [facility];  Service: General;  Laterality: N/A;   CHOLECYSTECTOMY     COLONOSCOPY WITH PROPOFOL N/A 07/24/2016   Procedure: COLONOSCOPY WITH PROPOFOL;  Surgeon: Charlott RakesSchooler, Vincent, MD;  [facility];  Service: Endoscopy;  Laterality: N/A;   DILATION AND CURETTAGE OF UTERUS  2000   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   Procedure: LAPAROSCOPIC ROUX-EN-Y GASTRIC BYPASS WITH UPPER ENDOSCOPY;  Surgeon: Gaynelle AduWilson, Eric, MD;  Location: [facility];  Service: General;  Laterality: N/A;   KNEE SURGERY Right      OB History   No obstetric history on file.     Family History  Problem Relation Age of Onset   Cancer Mother    Melanoma Mother    Arthritis Father    Cancer Maternal Grandmother    Heart disease Maternal Grandmother    Cancer Maternal Grandfather    Heart disease Maternal Grandfather    Depression Paternal Grandmother    Alcohol abuse Paternal Grandfather  Social History   Tobacco Use   Smoking status: Never   Smokeless tobacco: Never  Vaping Use   Vaping Use: Never used  Substance Use Topics   Alcohol use: Not Currently    Comment: rarely   Drug use: No    Home Medications Prior to Admission medications   Medication Sig Start Date End Date Taking? Authorizing Provider  DULoxetine (CYMBALTA) 60 MG capsule TAKE 1 CAPSULE BY MOUTH EVERY DAY Patient taking differently: Take 60 mg by mouth at bedtime. 01/18/20   Myrlene Broker, MD  enoxaparin (LOVENOX) 40 MG/0.4ML injection Inject 0.4 mLs (40 mg total) into the skin 2 (two) times daily for 14 days. 09/19/20 10/03/20  Gaynelle Adu, MD  lisinopril (ZESTRIL) 5 MG tablet Take 1 tablet (5 mg total) by mouth daily. Needs appointment before next  refill Patient taking differently: Take 5 mg by mouth at bedtime. 01/11/20   Myrlene Broker, MD  ondansetron (ZOFRAN-ODT) 4 MG disintegrating tablet Take 1 tablet (4 mg total) by mouth every 6 (six) hours as needed for nausea or vomiting. 09/19/20   Gaynelle Adu, MD  pantoprazole (PROTONIX) 40 MG tablet Take 1 tablet (40 mg total) by mouth daily. 09/19/20   Gaynelle Adu, MD  traMADol (ULTRAM) 50 MG tablet Take 1 tablet (50 mg total) by mouth every 6 (six) hours as needed (pain). 09/19/20   Gaynelle Adu, MD    Allergies    Patient has no known allergies.  Review of Systems   Review of Systems  Constitutional:  Positive for activity change.  Respiratory:  Negative for shortness of breath.   Cardiovascular:  Negative for chest pain.  Gastrointestinal:  Positive for abdominal pain.  Genitourinary:  Positive for hematuria. Negative for dysuria.  Allergic/Immunologic: Negative for immunocompromised state.  All other systems reviewed and are negative.  Physical Exam Updated Vital Signs BP 123/74   Pulse 82   Temp 98.6 F (37 C) (Oral)   Resp 18   Ht 5\' 1"  (1.549 m)   Wt (!) 140 kg   SpO2 97%   BMI 58.32 kg/m   Physical Exam Vitals and nursing note reviewed.  Constitutional:      Appearance: She is well-developed.  HENT:     Head: Atraumatic.  Eyes:     Extraocular Movements: Extraocular movements intact.     Pupils: Pupils are equal, round, and reactive to light.  Cardiovascular:     Rate and Rhythm: Normal rate.  Pulmonary:     Effort: Pulmonary effort is normal.  Abdominal:     General: There is distension.     Palpations: Abdomen is soft.  Musculoskeletal:     Cervical back: Normal range of motion and neck supple.  Skin:    General: Skin is warm and dry.  Neurological:     Mental Status: She is alert and oriented to person, place, and time.    ED Results / Procedures / Treatments   Labs (all labs ordered are listed, but only abnormal results are  displayed) Labs Reviewed  CBC WITH DIFFERENTIAL/PLATELET - Abnormal; Notable for the following components:      Result Value   Hemoglobin 11.8 (*)    RDW 16.0 (*)    All other components within normal limits  LIPASE, BLOOD - Abnormal; Notable for the following components:   Lipase 69 (*)    All other components within normal limits  LACTIC ACID, PLASMA - Abnormal; Notable for the following components:   Lactic Acid, Venous  2.0 (*)    All other components within normal limits  COMPREHENSIVE METABOLIC PANEL  URINALYSIS, ROUTINE W REFLEX MICROSCOPIC  I-STAT BETA HCG BLOOD, ED ([facility], [facility], [facility] ONLY)    EKG EKG Interpretation  Date/Time:  Friday September 28 2020 13:17:16 EDT Ventricular Rate:  82 PR Interval:  152 QRS Duration: 82 QT Interval:  378 QTC Calculation: 441 R Axis:   31 Text Interpretation: Normal sinus rhythm Normal ECG No acute changes No significant change since last tracing Confirmed by Derwood Kaplan [phone]) on 09/28/2020 1:32:09 PM  Radiology CT ABDOMEN PELVIS W CONTRAST  Result Date: 09/28/2020 CLINICAL DATA:  Bowel obstruction suspected, abdominal pain, status post gastric bypass EXAM: CT ABDOMEN AND PELVIS WITH CONTRAST TECHNIQUE: Multidetector CT imaging of the abdomen and pelvis was performed using the standard protocol following bolus administration of intravenous contrast. CONTRAST:  44mL OMNIPAQUE IOHEXOL 350 MG/ML SOLN, additional oral enteric contrast COMPARISON:  None. FINDINGS: Lower chest: No acute abnormality. Bandlike scarring and or atelectasis of the bilateral lung bases. Hepatobiliary: No focal liver abnormality is seen. Status post cholecystectomy. No biliary dilatation. Pancreas: Unremarkable. No pancreatic ductal dilatation or surrounding inflammatory changes. Spleen: Normal in size without significant abnormality. Adrenals/Urinary Tract: Adrenal glands are unremarkable. Kidneys are normal, without renal calculi, solid lesion, or hydronephrosis. Bladder is  unremarkable. 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 No evidence of bowel wall thickening, distention, or inflammatory changes. Vascular/Lymphatic: Scattered aortic atherosclerosis. No enlarged abdominal or pelvic lymph nodes. Reproductive: No mass or other significant abnormality. Bilateral ovarian cysts measuring 2.7 cm on the right and 2.0 cm on the left (series 2, image 70). No follow-up imaging recommended. Note: This recommendation does not apply to premenarchal patients and to those with increased risk (genetic, family history, elevated tumor markers or other high-risk factors) of ovarian cancer. Reference: JACR 2020 Feb; 17(2):248-254 Other: No abdominal wall hernia or abnormality. No abdominopelvic ascites. Musculoskeletal: No acute or significant osseous findings. IMPRESSION: 1. No acute CT findings of the abdomen or pelvis to explain pain. 2. 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 No evidence of bowel obstruction or other complication. 3. Status post cholecystectomy. Aortic Atherosclerosis (ICD10-I70.0). Electronically Signed   By: Lauralyn Primes M.D.   On: 09/28/2020 15:05    Procedures Procedures   Medications Ordered in ED Medications  fentaNYL (SUBLIMAZE) injection 50 mcg (0 mcg Intravenous Hold 09/28/20 1601)  iohexol (OMNIPAQUE) 9 MG/ML oral solution (has no administration in time range)  0.9 %  sodium chloride infusion (has no administration in time range)  ondansetron (ZOFRAN-ODT) disintegrating tablet 8 mg (8 mg Oral Given 09/28/20 1023)  lactated ringers bolus 1,000 mL (1,000 mLs Intravenous New Bag/Given 09/28/20 1610)  iohexol (OMNIPAQUE) 350 MG/ML injection 80 mL (80 mLs Intravenous Contrast Given 09/28/20 1411)  HYDROmorphone (DILAUDID) injection 1 mg (1 mg Intravenous Given 09/28/20 1619)  ondansetron (ZOFRAN-ODT) disintegrating tablet 8 mg (8 mg Oral Given 09/28/20 1618)    ED Course  I have reviewed the triage vital signs and the nursing  notes.  Pertinent labs & imaging results that were available during my care of the patient were reviewed by me and considered in my medical decision making (see chart for details).    MDM Rules/Calculators/A&P                            55 year old female comes in with chief complaint of abdominal pain and blood in the urine.  She is  postop from gastric bypass surgery.  On exam patient is nontoxic.  She is having some pleuritic component to her upper abdominal pain.  She is on Lovenox for prophylaxis, no signs of DVT, but PE is in the differential.  We will start with x-ray of the chest and basic labs for now along with CT scan of the abdomen pelvis -gastric bypass protocol.  We will monitor her hemodynamics closely and add a CT angio PE if clinical suspicion for PE is high enough.  She is not a dimer candidate.  With the blood in the urine, UTIs in the differential.  Intra-abdominally, concerns would be for hernia, SBO, endoleak, ulcers, necrosis.  4:48 PM Surgery admitting for symptom management.  Final Clinical Impression(s) / ED Diagnoses Final diagnoses:  Personal history of gastric bypass  Pain of upper abdomen    Rx / DC Orders ED Discharge Orders     None        Derwood Kaplan, MD 09/28/20 [phone]

## 2020-09-28 NOTE — H&P (Signed)
CC: LUQ pain  Requesting provider: n/a  HPI: Christine Ramirez is an 55 y.o. female who is here for evaluation because of pain with oral intake, LUQ pain.  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  She called into the clinic earlier this week with some low volume oral intake.  She was sent for IV hydration.  At that time her labs were unremarkable.  She also felt full and bloated.  She was instructed to increase her MiraLAX regimen as well as to take milk of magnesia.  She states that she did that yesterday and it really helped she no longer felt full and had several good bowel movements.  However she called early this morning complaining of inability to tolerate liquids.  She states that when she drinks liquids she would have immediate left upper quadrant severe pain.  She only had tolerated about 2 to 4 ounces of water yesterday.  I therefore instructed her to come to the emergency room for evaluation.  She denies any fevers or chills.  The fullness and bloatedness that she had has significantly improved.  But she still was having pain with oral intake.  She also states that she had pain on the left side when taking a deep breath  No fever or chills.  No retching. She has been doing twice daily Lovenox prophylactic therapy.  Her last dose was this morning.  Labs in the ER were unremarkable.  She also underwent a CT scan this afternoon which showed no signs of perforation, leak, or free fluid, internal hernia or bowel obstruction.  Past Medical History:  Diagnosis Date   Anemia    Anxiety    Arthritis    osteo knees   COPD (chronic obstructive pulmonary disease) (HCC)    chronic bronchitis   Depression    Fatty liver    GERD (gastroesophageal reflux disease)    H/O typhoid fever    age 2   Headache    Heart murmur    as a child   Hepatitis    age 79   Hyperlipidemia    Hypertension    Jaundice    age 44   Pneumonia    in past   PONV  (postoperative nausea and vomiting)    Scarlet fever    age 58   Shortness of breath dyspnea     Past Surgical History:  Procedure Laterality Date   ABDOMINAL EXPLORATION SURGERY  1975   age 79 at [facility]-open procedure   APPENDECTOMY     CHOLECYSTECTOMY N/A 03/19/2015   Procedure: LAPAROSCOPIC CHOLECYSTECTOMY WITH INTRAOPERATIVE CHOLANGIOGRAM;  Surgeon: Gaynelle Adu, MD;  Location: [facility];  Service: General;  Laterality: N/A;   CHOLECYSTECTOMY     COLONOSCOPY WITH PROPOFOL N/A 07/24/2016   Procedure: COLONOSCOPY WITH PROPOFOL;  Surgeon: Charlott Rakes, MD;  Location: [facility] ENDOSCOPY;  Service: Endoscopy;  Laterality: N/A;   DILATION AND CURETTAGE OF UTERUS  2000   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   Procedure: LAPAROSCOPIC ROUX-EN-Y GASTRIC BYPASS WITH UPPER ENDOSCOPY;  Surgeon: Gaynelle Adu, MD;  [facility]: [facility];  Service: General;  Laterality: N/A;   KNEE SURGERY Right     Family History  Problem Relation Age of Onset   Cancer Mother    Melanoma Mother    Arthritis Father    Cancer Maternal Grandmother    Heart disease Maternal Grandmother    Cancer Maternal Grandfather    Heart disease Maternal Grandfather    Depression Paternal  Grandmother    Alcohol abuse Paternal Grandfather     Social:  reports that she has never smoked. She has never used smokeless tobacco. She reports previous alcohol use. She reports that she does not use drugs.  Allergies: No Known Allergies  Medications: I have reviewed the patient's current medications.   ROS - all of the below systems have been reviewed with the patient and positives are indicated with bold text General: chills, fever or night sweats Eyes: blurry vision or double vision ENT: epistaxis or sore throat Allergy/Immunology: itchy/watery eyes or nasal congestion Hematologic/Lymphatic: bleeding problems, blood clots or swollen lymph nodes Endocrine: temperature intolerance or unexpected weight changes Breast: new or  changing breast lumps or nipple discharge Resp: cough, shortness of breath, or wheezing CV: chest pain or dyspnea on exertion GI: as per HPI GU: dysuria, trouble voiding, or hematuria MSK: joint pain or joint stiffness Neuro: TIA or stroke symptoms Derm: pruritus and skin lesion changes Psych: anxiety and depression  PE Blood pressure 123/74, pulse 82, temperature 98.6 F (37 C), temperature source Oral, resp. rate 18, height 5\' 1"  (1.549 m), weight (!) 140 kg, SpO2 97 %. Constitutional: NAD; conversant; no deformities Eyes: Moist conjunctiva; no lid lag; anicteric; PERRL Neck: Trachea midline; no thyromegaly Lungs: Normal respiratory effort; no tactile fremitus CV: RRR; no palpable thrills; no pitting edema GI: Abd soft, obese, incisions ok, minor blister at one incision, not really TTP; no palpable hepatosplenomegaly MSK: Normal gait; no clubbing/cyanosis Psychiatric: Appropriate affect; alert and oriented x3 Lymphatic: No palpable cervical or axillary lymphadenopathy Skin:no rash/jaundice. See above  Results for orders placed or performed during the hospital encounter of 09/28/20 (from the past 48 hour(s))  Comprehensive metabolic panel     Status: None   Collection Time: 09/28/20 10:46 AM  Result Value Ref Range   Sodium 139 135 - 145 mmol/L   Potassium 4.3 3.5 - 5.1 mmol/L   Chloride 105 98 - 111 mmol/L   CO2 24 22 - 32 mmol/L   Glucose, Bld 94 70 - 99 mg/dL    Comment: Glucose reference range applies only to samples taken after fasting for at least 8 hours.   BUN 11 6 - 20 mg/dL   Creatinine, Ser 11/28/20 0.44 - 1.00 mg/dL   Calcium 8.9 8.9 - 4.74 mg/dL   Total Protein 7.3 6.5 - 8.1 g/dL   Albumin 3.8 3.5 - 5.0 g/dL   AST 19 15 - 41 U/L   ALT 26 0 - 44 U/L   Alkaline Phosphatase 55 38 - 126 U/L   Total Bilirubin 0.6 0.3 - 1.2 mg/dL   GFR, Estimated 25.9 >56 mL/min    Comment: (NOTE) Calculated using the CKD-EPI Creatinine Equation (2021)    Anion gap 10 5 - 15     Comment: Performed at [facility], [address] [place], [place]  CBC with Differential     Status: Abnormal   Collection Time: 09/28/20 10:46 AM  Result Value Ref Range   WBC 8.0 4.0 - 10.5 K/uL   RBC 4.28 3.87 - 5.11 MIL/uL   Hemoglobin 11.8 (L) 12.0 - 15.0 g/dL   HCT 11/28/20 32.9 - 51.8 %   MCV 89.7 80.0 - 100.0 fL   MCH 27.6 26.0 - 34.0 pg   MCHC 30.7 30.0 - 36.0 g/dL   RDW 84.1 (H) 66.0 - 63.0 %   Platelets 303 150 - 400 K/uL   nRBC 0.0 0.0 - 0.2 %  Neutrophils Relative % 74 %   Neutro Abs 5.9 1.7 - 7.7 K/uL   Lymphocytes Relative 16 %   Lymphs Abs 1.3 0.7 - 4.0 K/uL   Monocytes Relative 7 %   Monocytes Absolute 0.6 0.1 - 1.0 K/uL   Eosinophils Relative 2 %   Eosinophils Absolute 0.2 0.0 - 0.5 K/uL   Basophils Relative 0 %   Basophils Absolute 0.0 0.0 - 0.1 K/uL   Immature Granulocytes 1 %   Abs Immature Granulocytes 0.06 0.00 - 0.07 K/uL    Comment: Performed at [facility], [address] [place], [place] [address]  Lipase, blood     Status: Abnormal   Collection Time: 09/28/20 10:46 AM  Result Value Ref Range   Lipase 69 (H) 11 - 51 U/L    Comment: Performed at [facility], [address], [place], [place] [address]  Lactic acid, plasma     Status: Abnormal   Collection Time: 09/28/20 10:51 AM  Result Value Ref Range   Lactic Acid, Venous 2.0 (HH) 0.5 - 1.9 mmol/L    Comment: CRITICAL RESULT CALLED TO, READ BACK BY AND VERIFIED WITH: SMITH,A. RN AT 1140 09/28/20 MULLINS,T Performed at [facility], [address], [place], [place] [address]   I-Stat Beta hCG blood, ED ([facility], [facility], [facility] only)     Status: None   Collection Time: 09/28/20 11:00 AM  Result Value Ref Range   I-stat hCG, quantitative <5.0 <5 mIU/mL   Comment 3            Comment:   GEST. AGE      CONC.  (mIU/mL)   <=1 WEEK        5 - 50     2 WEEKS       50 - 500     3 WEEKS       100 - 10,000     4 WEEKS     1,000  - 30,000        FEMALE AND NON-PREGNANT FEMALE:     LESS THAN 5 mIU/mL   Urinalysis, Routine w reflex microscopic     Status: Abnormal   Collection Time: 09/28/20  4:11 PM  Result Value Ref Range   Color, Urine AMBER (A) YELLOW    Comment: BIOCHEMICALS MAY BE AFFECTED BY COLOR   APPearance HAZY (A) CLEAR   Specific Gravity, Urine <1.005 (L) 1.005 - 1.030   pH 6.0 5.0 - 8.0   Glucose, UA NEGATIVE NEGATIVE mg/dL   Hgb urine dipstick LARGE (A) NEGATIVE   Bilirubin Urine NEGATIVE NEGATIVE   Ketones, ur 80 (A) NEGATIVE mg/dL   Protein, ur 628 (A) NEGATIVE mg/dL   Nitrite NEGATIVE NEGATIVE   Leukocytes,Ua TRACE (A) NEGATIVE   RBC / HPF >50 (H) 0 - 5 RBC/hpf   WBC, UA 6-10 0 - 5 WBC/hpf   Bacteria, UA RARE (A) NONE SEEN   Squamous Epithelial / LPF 11-20 0 - 5   Mucus PRESENT     Comment: Performed at [facility], [address] , [place] [address]    CT ABDOMEN PELVIS W CONTRAST  Result Date: 09/28/2020 CLINICAL DATA:  Bowel obstruction suspected, abdominal pain, status post gastric bypass EXAM: CT ABDOMEN AND PELVIS WITH CONTRAST TECHNIQUE: Multidetector CT imaging of the abdomen and pelvis was performed using the standard protocol following bolus administration of intravenous contrast. CONTRAST:  73mL OMNIPAQUE IOHEXOL 350 MG/ML SOLN, additional oral enteric contrast  COMPARISON:  None. FINDINGS: Lower chest: No acute abnormality. Bandlike scarring and or atelectasis of the bilateral lung bases. Hepatobiliary: No focal liver abnormality is seen. Status post cholecystectomy. No biliary dilatation. Pancreas: Unremarkable. No pancreatic ductal dilatation or surrounding inflammatory changes. Spleen: Normal in size without significant abnormality. Adrenals/Urinary Tract: Adrenal glands are unremarkable. Kidneys are normal, without renal calculi, solid lesion, or hydronephrosis. Bladder is unremarkable. 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 No  evidence of bowel wall thickening, distention, or inflammatory changes. Vascular/Lymphatic: Scattered aortic atherosclerosis. No enlarged abdominal or pelvic lymph nodes. Reproductive: No mass or other significant abnormality. Bilateral ovarian cysts measuring 2.7 cm on the right and 2.0 cm on the left (series 2, image 70). No follow-up imaging recommended. Note: This recommendation does not apply to premenarchal patients and to those with increased risk (genetic, family history, elevated tumor markers or other high-risk factors) of ovarian cancer. Reference: JACR 2020 Feb; 17(2):248-254 Other: No abdominal wall hernia or abnormality. No abdominopelvic ascites. Musculoskeletal: No acute or significant osseous findings. IMPRESSION: 1. No acute CT findings of the abdomen or pelvis to explain pain. 2. 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 No evidence of bowel obstruction or other complication. 3. Status post cholecystectomy. Aortic Atherosclerosis (ICD10-I70.0). Electronically Signed   By: Lauralyn PrimesAlex  Bibbey M.D.   On: 09/28/2020 15:05    Imaging: Personally reviewed  A/P: Christine Ramirez is an 55 y.o. 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 intolerance Severe obesity  No signs of leak, perforation, free air or internal hernia on CT. Some of her pain since almost seems pleuritic in the sense that it is worsened with a deep breath.  However the fact that she states her pain is worsened almost with immediate oral intake or within a minute suggest perhaps some type of pouch gastritis or perhaps early ulcer  I am not sure if we are going to be able to do a p.o. challenge and see if she is in a view to tolerate enough liquids in order to safely discharge her from the emergency room  Therefore I recommended admission IV fluids Start meds for presumed marginal ulcer-twice daily Protonix and liquid Carafate Bariatric full liquid  diet with protein shakes Chemical DVT prophylaxis Ambulate Small bilateral ovarian cysts  Discussed with her and her husband  Mary Sellaric M. Andrey CampanileWilson, MD, FACS General, Bariatric, & Minimally Invasive Surgery [facility] [place]

## 2020-09-28 NOTE — ED Triage Notes (Signed)
Pt arrives POV. Patient reports having gastric bypass surgery in July and has been having issues with constipation since then. Patient reports having multiple bowl movements since taking medications. Patient reports now feeling extreme abd pain and states blood in her urine. Denies blood in stool. Patient believes  she is bleeding internally from her surgery.

## 2020-09-28 NOTE — Telephone Encounter (Signed)
Is there a question here for me?

## 2020-09-28 NOTE — ED Triage Notes (Signed)
Spoke with Dr. Andrey Campanile who reports patient is post op bariatric surgery sent for further evaluation of abdominal pain.

## 2020-09-28 NOTE — Telephone Encounter (Signed)
Patient called in from home with complaints of severe left upper quadrant left-sided pain with any oral intake.  She is about 10 days out from a laparoscopic Roux-en-Y gastric bypass.  She had had some marginal oral intake a few days ago and was sent for IV hydration and labs were unremarkable. She also c/o bloating and was placed on addl bowel regimen - miralax and MOM. States she took miralax and MOM & had 4-5 bms yesterday/overnight. But now with any oral intake has "extreme" pain on left side. Advised pt that she needs to come to [facility] ED for evaluation.  Will need IVF, labs, and CT.  Pt voiced understanding. Will alert ED  [name]. Andrey Campanile, MD, FACS General, Bariatric, & Minimally Invasive Surgery [facility], [place]

## 2020-09-28 NOTE — ED Notes (Signed)
IV attempt x2 without success. IV team consult placed.

## 2020-09-29 DIAGNOSIS — M5416 Radiculopathy, lumbar region: Secondary | ICD-10-CM | POA: Diagnosis present

## 2020-09-29 DIAGNOSIS — Z8619 Personal history of other infectious and parasitic diseases: Secondary | ICD-10-CM | POA: Diagnosis not present

## 2020-09-29 DIAGNOSIS — Z6841 Body Mass Index (BMI) 40.0 and over, adult: Secondary | ICD-10-CM | POA: Diagnosis not present

## 2020-09-29 DIAGNOSIS — F32A Depression, unspecified: Secondary | ICD-10-CM | POA: Diagnosis present

## 2020-09-29 DIAGNOSIS — F419 Anxiety disorder, unspecified: Secondary | ICD-10-CM | POA: Diagnosis present

## 2020-09-29 DIAGNOSIS — Z79899 Other long term (current) drug therapy: Secondary | ICD-10-CM | POA: Diagnosis not present

## 2020-09-29 DIAGNOSIS — Z20822 Contact with and (suspected) exposure to covid-19: Secondary | ICD-10-CM | POA: Diagnosis present

## 2020-09-29 DIAGNOSIS — K289 Gastrojejunal ulcer, unspecified as acute or chronic, without hemorrhage or perforation: Secondary | ICD-10-CM | POA: Diagnosis present

## 2020-09-29 DIAGNOSIS — N83202 Unspecified ovarian cyst, left side: Secondary | ICD-10-CM | POA: Diagnosis present

## 2020-09-29 DIAGNOSIS — J449 Chronic obstructive pulmonary disease, unspecified: Secondary | ICD-10-CM | POA: Diagnosis present

## 2020-09-29 DIAGNOSIS — K219 Gastro-esophageal reflux disease without esophagitis: Secondary | ICD-10-CM | POA: Diagnosis not present

## 2020-09-29 DIAGNOSIS — I10 Essential (primary) hypertension: Secondary | ICD-10-CM | POA: Diagnosis present

## 2020-09-29 DIAGNOSIS — Z8616 Personal history of COVID-19: Secondary | ICD-10-CM | POA: Diagnosis not present

## 2020-09-29 DIAGNOSIS — Z8261 Family history of arthritis: Secondary | ICD-10-CM | POA: Diagnosis not present

## 2020-09-29 DIAGNOSIS — R7303 Prediabetes: Secondary | ICD-10-CM | POA: Diagnosis present

## 2020-09-29 DIAGNOSIS — N83201 Unspecified ovarian cyst, right side: Secondary | ICD-10-CM | POA: Diagnosis present

## 2020-09-29 DIAGNOSIS — K5900 Constipation, unspecified: Secondary | ICD-10-CM | POA: Diagnosis present

## 2020-09-29 DIAGNOSIS — G4733 Obstructive sleep apnea (adult) (pediatric): Secondary | ICD-10-CM | POA: Diagnosis present

## 2020-09-29 DIAGNOSIS — E782 Mixed hyperlipidemia: Secondary | ICD-10-CM | POA: Diagnosis present

## 2020-09-29 DIAGNOSIS — Z8701 Personal history of pneumonia (recurrent): Secondary | ICD-10-CM | POA: Diagnosis not present

## 2020-09-29 DIAGNOSIS — Z9884 Bariatric surgery status: Secondary | ICD-10-CM | POA: Diagnosis not present

## 2020-09-29 DIAGNOSIS — M170 Bilateral primary osteoarthritis of knee: Secondary | ICD-10-CM | POA: Diagnosis present

## 2020-09-29 DIAGNOSIS — K760 Fatty (change of) liver, not elsewhere classified: Secondary | ICD-10-CM | POA: Diagnosis present

## 2020-09-29 DIAGNOSIS — R1012 Left upper quadrant pain: Secondary | ICD-10-CM | POA: Diagnosis present

## 2020-09-29 LAB — BASIC METABOLIC PANEL
Anion gap: 11 (ref 5–15)
BUN: 8 mg/dL (ref 6–20)
CO2: 23 mmol/L (ref 22–32)
Calcium: 8.7 mg/dL — ABNORMAL LOW (ref 8.9–10.3)
Chloride: 104 mmol/L (ref 98–111)
Creatinine, Ser: 0.57 mg/dL (ref 0.44–1.00)
GFR, Estimated: >60 mL/min (ref >60)
Glucose, Bld: 117 mg/dL — ABNORMAL HIGH (ref 70–99)
Potassium: 3.6 mmol/L (ref 3.5–5.1)
Sodium: 138 mmol/L (ref 135–145)

## 2020-09-29 LAB — CBC
HCT: 35.3 % — ABNORMAL LOW (ref 36.0–46.0)
Hemoglobin: 10.7 g/dL — ABNORMAL LOW (ref 12.0–15.0)
MCH: 27.2 pg (ref 26.0–34.0)
MCHC: 30.3 g/dL (ref 30.0–36.0)
MCV: 89.6 fL (ref 80.0–100.0)
Platelets: 281 10*3/uL (ref 150–400)
RBC: 3.94 MIL/uL (ref 3.87–5.11)
RDW: 15.9 % — ABNORMAL HIGH (ref 11.5–15.5)
WBC: 7.8 10*3/uL (ref 4.0–10.5)
nRBC: 0 % (ref 0.0–0.2)

## 2020-09-29 LAB — MAGNESIUM: Magnesium: 2 mg/dL (ref 1.7–2.4)

## 2020-09-29 LAB — SARS CORONAVIRUS 2 (TAT 6-24 HRS): SARS Coronavirus 2: NEGATIVE

## 2020-09-29 MED ORDER — HYOSCYAMINE SULFATE 0.125 MG SL SUBL
0.1250 mg | SUBLINGUAL_TABLET | Freq: Four times a day (QID) | SUBLINGUAL | Status: DC | PRN
  Filled 2020-09-29: qty 1

## 2020-09-29 MED ORDER — GABAPENTIN 250 MG/5ML PO SOLN
200.0000 mg | Freq: Three times a day (TID) | ORAL | Status: DC
  Administered 2020-09-29 – 2020-10-02 (×9): 200 mg via ORAL
  Filled 2020-09-29 (×11): qty 4

## 2020-09-29 MED ORDER — MAGNESIUM HYDROXIDE 400 MG/5ML PO SUSP: 30.0000 mL | Freq: Every day | ORAL | Status: DC | PRN

## 2020-09-29 NOTE — Progress Notes (Signed)
Patient ID: Christine Ramirez, female   DOB: 1965-03-23, 55 y.o.   MRN: [id number]   Bariatric Surgery Service Progress Note:    Chief Complaint/Subjective: Rough night. Able to sleep for about 2hrs at a time.  Still with LUQ pain, still with pain with oral intake Now feel bloated/full again  Objective: Vital signs in last 24 hours: Temp:  [97.8 F (36.6 C)-98.1 F (36.7 C)] 97.9 F (36.6 C) (08/06 0933) Pulse Rate:  [70-86] 70 (08/06 0933) Resp:  [18] 18 (08/06 0933) BP: (90-139)/(52-86) 105/59 (08/06 0933) SpO2:  [89 %-97 %] 96 % (08/06 0933) Weight:  [140 kg] 140 kg (08/05 1013) Last BM Date: 09/28/20  Intake/Output from previous day: 08/05 0701 - 08/06 0700 In: 1504.3 [P.O.:420; I.V.:1084.3] Out: 800 [Urine:800] Intake/Output this shift: No intake/output data recorded.  Lungs: cta, nonlabored  Cardiovascular: reg  Abd: soft, obese, incisions - some bruising, some TTP in LUQ  Extremities: no edema, +SCDs  Neuro: alert, nonfocal  Lab Results: CBC  Recent Labs    09/28/20 1046 09/29/20 0506  WBC 8.0 7.8  HGB 11.8* 10.7*  HCT 38.4 35.3*  PLT 303 281   BMET Recent Labs    09/28/20 1046 09/29/20 0506  NA 139 138  K 4.3 3.6  CL 105 104  CO2 24 23  GLUCOSE 94 117*  BUN 11 8  CREATININE 0.68 0.57  CALCIUM 8.9 8.7*   LFT Hepatic Function Latest Ref Rng & Units 09/28/2020 09/26/2020 09/19/2020  Total Protein 6.5 - 8.1 g/dL 7.3 6.9 6.7  Albumin 3.5 - 5.0 g/dL 3.8 3.5 3.6  AST 15 - 41 U/L 19 19 38  ALT 0 - 44 U/L 26 28 62(H)  Alk Phosphatase 38 - 126 U/L 55 56 47  Total Bilirubin 0.3 - 1.2 mg/dL 0.6 0.8 0.3   PT/INR No results for input(s): LABPROT, INR in the last 72 hours. ABG No results for input(s): PHART, HCO3 in the last 72 hours.  Invalid input(s): PCO2, PO2  Studies/Results:  Anti-infectives: Anti-infectives (From admission, onward)    None       Medications: Scheduled Meds:  acetaminophen  1,000 mg Oral Q8H   DULoxetine  60 mg  Oral QHS   enoxaparin (LOVENOX) injection  30 mg Subcutaneous Q12H   gabapentin  200 mg Oral Q8H   lisinopril  5 mg Oral QHS   pantoprazole (PROTONIX) IV  40 mg Intravenous Q12H   polyethylene glycol  17 g Oral Daily   Ensure Max Protein  11 oz Oral BID   sucralfate  1 g Oral TID WC & HS   Continuous Infusions:  dextrose 5 % and 0.45 % NaCl with KCl 20 mEq/L 125 mL/hr at 09/28/20 1930   PRN Meds:.diphenhydrAMINE **OR** diphenhydrAMINE, hyoscyamine, magnesium hydroxide, morphine injection, ondansetron **OR** ondansetron (ZOFRAN) IV, oxyCODONE, simethicone  Assessment/Plan: Patient Active Problem List   Diagnosis Date Noted   LUQ pain 09/28/2020   OSA (obstructive sleep apnea) - mild 09/18/2020   Prediabetes 09/18/2020   Lumbar radiculopathy 09/18/2020   Mixed hyperlipidemia 05/14/2020   Heart murmur 05/14/2020   COVID 03/09/2020   Routine general medical examination at a health care facility 07/10/2017   Chronic diarrhea 02/28/2016   Osteoarthritis 09/11/2015   Morbid obesity ([place]) 12/15/2014   Essential hypertension 12/15/2014   Gastro-esophageal reflux disease without esophagitis 07/03/2011   Christine Ramirez is an 55 y.o. 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 intolerance Severe  obesity   Etiology of pain unclear - no signs of internal hernia, leak, perforation, obstruction. Symptoms most c/w ischemic ulcer at G-J perhaps Cont empiric bid protonix and carafate Will add levsin Will add gabapentin for pain control to try to minimize opioids Add MOM, cont scheduled miralax Cont IVF Cont chemical vte prophylaxis Will check UGI in am Ambulate D/w with pt and husband Cont bari fulls as tolerated, encouraged pt to try after a dose of carafate  Disposition:  LOS: 0 days    Leighton Ruff. Redmond Pulling, MD, FACS General, Bariatric, & Minimally Invasive Surgery [phone] [facility]

## 2020-09-30 ENCOUNTER — Inpatient Hospital Stay (HOSPITAL_COMMUNITY): Payer: Federal, State, Local not specified - PPO

## 2020-09-30 LAB — CBC
HCT: 33.8 % — ABNORMAL LOW (ref 36.0–46.0)
Hemoglobin: 10.2 g/dL — ABNORMAL LOW (ref 12.0–15.0)
MCH: 27.1 pg (ref 26.0–34.0)
MCHC: 30.2 g/dL (ref 30.0–36.0)
MCV: 89.9 fL (ref 80.0–100.0)
Platelets: 280 10*3/uL (ref 150–400)
RBC: 3.76 MIL/uL — ABNORMAL LOW (ref 3.87–5.11)
RDW: 16 % — ABNORMAL HIGH (ref 11.5–15.5)
WBC: 7.4 10*3/uL (ref 4.0–10.5)
nRBC: 0 % (ref 0.0–0.2)

## 2020-09-30 MED ORDER — CENTRUM ADULTS PO CHEW
1.0000 | CHEWABLE_TABLET | Freq: Every day | ORAL | Status: DC
  Administered 2020-10-01 – 2020-10-02 (×2): 1 via ORAL
  Filled 2020-09-30 (×4): qty 1

## 2020-09-30 NOTE — Progress Notes (Signed)
Patient ID: Christine Ramirez, female   DOB: 11-Sep-1965, 55 y.o.   MRN: [id number]   Bariatric Surgery Service Progress Note:    Chief Complaint/Subjective: Having BMs Less L sided pain Tolerated some water- still with some discomfort with oral intake but not as bad Didn't do well when tried protein shake Ambulated Able to pull self up in bed without as much pain on L side  Objective: Vital signs in last 24 hours: Temp:  [97.9 F (36.6 C)-98.7 F (37.1 C)] 97.9 F (36.6 C) (08/07 0513) Pulse Rate:  [70-79] 73 (08/07 0513) Resp:  [17-19] 18 (08/07 0513) BP: (103-114)/(50-65) 114/58 (08/07 0513) SpO2:  [94 %-98 %] 98 % (08/07 0513) Last BM Date: 09/29/20  Intake/Output from previous day: 08/06 0701 - 08/07 0700 In: 2916.2 [P.O.:580; I.V.:2336.2] Out: 900 [Urine:900] Intake/Output this shift: No intake/output data recorded.  Lungs: cta, nonlabored  Cardiovascular: reg  Abd: soft, obese, incisions - some bruising, less TTP in LUQ  Extremities: no edema, +SCDs  Neuro: alert, nonfocal  Lab Results: CBC  Recent Labs    09/29/20 0506 09/30/20 0403  WBC 7.8 7.4  HGB 10.7* 10.2*  HCT 35.3* 33.8*  PLT 281 280    BMET Recent Labs    09/28/20 1046 09/29/20 0506  NA 139 138  K 4.3 3.6  CL 105 104  CO2 24 23  GLUCOSE 94 117*  BUN 11 8  CREATININE 0.68 0.57  CALCIUM 8.9 8.7*    LFT Hepatic Function Latest Ref Rng & Units 09/28/2020 09/26/2020 09/19/2020  Total Protein 6.5 - 8.1 g/dL 7.3 6.9 6.7  Albumin 3.5 - 5.0 g/dL 3.8 3.5 3.6  AST 15 - 41 U/L 19 19 38  ALT 0 - 44 U/L 26 28 62(H)  Alk Phosphatase 38 - 126 U/L 55 56 47  Total Bilirubin 0.3 - 1.2 mg/dL 0.6 0.8 0.3   PT/INR No results for input(s): LABPROT, INR in the last 72 hours. ABG No results for input(s): PHART, HCO3 in the last 72 hours.  Invalid input(s): PCO2, PO2  Studies/Results:  Anti-infectives: Anti-infectives (From admission, onward)    None       Medications: Scheduled Meds:   acetaminophen  1,000 mg Oral Q8H   DULoxetine  60 mg Oral QHS   enoxaparin (LOVENOX) injection  30 mg Subcutaneous Q12H   gabapentin  200 mg Oral Q8H   lisinopril  5 mg Oral QHS   pantoprazole (PROTONIX) IV  40 mg Intravenous Q12H   polyethylene glycol  17 g Oral Daily   Ensure Max Protein  11 oz Oral BID   sucralfate  1 g Oral TID WC & HS   Continuous Infusions:  dextrose 5 % and 0.45 % NaCl with KCl 20 mEq/L 125 mL/hr at 09/30/20 0700   PRN Meds:.diphenhydrAMINE **OR** diphenhydrAMINE, hyoscyamine, magnesium hydroxide, morphine injection, ondansetron **OR** ondansetron (ZOFRAN) IV, oxyCODONE, simethicone  Assessment/Plan: Patient Active Problem List   Diagnosis Date Noted   LUQ pain 09/28/2020   OSA (obstructive sleep apnea) - mild 09/18/2020   Prediabetes 09/18/2020   Lumbar radiculopathy 09/18/2020   Mixed hyperlipidemia 05/14/2020   Heart murmur 05/14/2020   COVID 03/09/2020   Routine general medical examination at a health care facility 07/10/2017   Chronic diarrhea 02/28/2016   Osteoarthritis 09/11/2015   Morbid obesity ([place]) 12/15/2014   Essential hypertension 12/15/2014   Gastro-esophageal reflux disease without esophagitis 07/03/2011   Christine Ramirez is an 55 y.o. female with Left upper quadrant pain Status post  laparoscopic Roux-en-Y gastric bypass September 18, 2020 P.o. intolerance Severe obesity   Etiology of pain unclear - no signs of internal hernia, leak, perforation, obstruction. Symptoms most c/w ischemic ulcer at G-J perhaps Cont empiric bid protonix and carafate Cont levsin Cont  gabapentin for pain control to try to minimize opioids MOM prn, cont scheduled miralax Cont IVF Cont chemical vte prophylaxis UGI later this am Ambulate  Cont bari fulls as tolerated,   Disposition: slowly improving. Not ready for dc; add back mvi  LOS: 1 day    Leighton Ruff. Redmond Pulling, MD, FACS General, Bariatric, & Minimally Invasive Surgery [phone] [facility]

## 2020-10-01 LAB — CBC
HCT: 40 % (ref 36.0–46.0)
Hemoglobin: 12.3 g/dL (ref 12.0–15.0)
MCH: 27.3 pg (ref 26.0–34.0)
MCHC: 30.8 g/dL (ref 30.0–36.0)
MCV: 88.9 fL (ref 80.0–100.0)
Platelets: 230 10*3/uL (ref 150–400)
RBC: 4.5 MIL/uL (ref 3.87–5.11)
RDW: 16.2 % — ABNORMAL HIGH (ref 11.5–15.5)
WBC: 7.1 10*3/uL (ref 4.0–10.5)
nRBC: 0 % (ref 0.0–0.2)

## 2020-10-01 LAB — BASIC METABOLIC PANEL
Anion gap: 6 (ref 5–15)
BUN: <5 mg/dL — ABNORMAL LOW (ref 6–20)
CO2: 23 mmol/L (ref 22–32)
Calcium: 8.7 mg/dL — ABNORMAL LOW (ref 8.9–10.3)
Chloride: 111 mmol/L (ref 98–111)
Creatinine, Ser: 0.68 mg/dL (ref 0.44–1.00)
GFR, Estimated: >60 mL/min (ref >60)
Glucose, Bld: 113 mg/dL — ABNORMAL HIGH (ref 70–99)
Potassium: 4 mmol/L (ref 3.5–5.1)
Sodium: 140 mmol/L (ref 135–145)

## 2020-10-01 MED ORDER — PANTOPRAZOLE SODIUM 40 MG PO TBEC
40.0000 mg | DELAYED_RELEASE_TABLET | Freq: Two times a day (BID) | ORAL | Status: DC
  Administered 2020-10-01 – 2020-10-02 (×2): 40 mg via ORAL
  Filled 2020-10-01 (×2): qty 1

## 2020-10-01 NOTE — Progress Notes (Signed)
Patient ID: Christine Ramirez, female   DOB: [birth year], 55 y.o.   MRN: [id number]   Bariatric Surgery Service Progress Note:    Chief Complaint/Subjective: Rough day - lots of BMs But not having LUQ pain Drinking some water with crystal and that is going better  Objective: Vital signs in last 24 hours: Temp:  [97.5 F (36.4 C)-98.1 F (36.7 C)] 97.9 F (36.6 C) (08/08 0536) Pulse Rate:  [64-79] 69 (08/08 0536) Resp:  [16-18] 16 (08/08 0536) BP: (92-132)/(59-74) 92/60 (08/08 0536) SpO2:  [97 %-99 %] 97 % (08/08 0536) Last BM Date: 09/30/20  Intake/Output from previous day: 08/07 0701 - 08/08 0700 In: 2785 [P.O.:420; I.V.:2365] Out: -  Intake/Output this shift: No intake/output data recorded.  Lungs: cta, nonlabored  Cardiovascular: reg  Abd: soft, obese, incisions - some bruising, nontender in LUQ  Extremities: no edema, +SCDs  Neuro: alert, nonfocal  Lab Results: CBC  Recent Labs    09/30/20 0403 10/01/20 0320  WBC 7.4 7.1  HGB 10.2* 12.3  HCT 33.8* 40.0  PLT 280 230    BMET Recent Labs    09/29/20 0506 10/01/20 0320  NA 138 140  K 3.6 4.0  CL 104 111  CO2 23 23  GLUCOSE 117* 113*  BUN 8 <5*  CREATININE 0.57 0.68  CALCIUM 8.7* 8.7*    LFT Hepatic Function Latest Ref Rng & Units 09/28/2020 09/26/2020 09/19/2020  Total Protein 6.5 - 8.1 g/dL 7.3 6.9 6.7  Albumin 3.5 - 5.0 g/dL 3.8 3.5 3.6  AST 15 - 41 U/L 19 19 38  ALT 0 - 44 U/L 26 28 62(H)  Alk Phosphatase 38 - 126 U/L 55 56 47  Total Bilirubin 0.3 - 1.2 mg/dL 0.6 0.8 0.3   PT/INR No results for input(s): LABPROT, INR in the last 72 hours. ABG No results for input(s): PHART, HCO3 in the last 72 hours.  Invalid input(s): PCO2, PO2  Studies/Results:  Anti-infectives: Anti-infectives (From admission, onward)    None       Medications: Scheduled Meds:  acetaminophen  1,000 mg Oral Q8H   Centrum Adults  1 tablet Oral Daily   DULoxetine  60 mg Oral QHS   enoxaparin (LOVENOX)  injection  30 mg Subcutaneous Q12H   gabapentin  200 mg Oral Q8H   lisinopril  5 mg Oral QHS   pantoprazole (PROTONIX) IV  40 mg Intravenous Q12H   polyethylene glycol  17 g Oral Daily   Ensure Max Protein  11 oz Oral BID   sucralfate  1 g Oral TID WC & HS   Continuous Infusions:  dextrose 5 % and 0.45 % NaCl with KCl 20 mEq/L 125 mL/hr at 09/30/20 0700   PRN Meds:.diphenhydrAMINE **OR** diphenhydrAMINE, hyoscyamine, magnesium hydroxide, morphine injection, ondansetron **OR** ondansetron (ZOFRAN) IV, oxyCODONE, simethicone  Assessment/Plan: Patient Active Problem List   Diagnosis Date Noted   LUQ pain 09/28/2020   OSA (obstructive sleep apnea) - mild 09/18/2020   Prediabetes 09/18/2020   Lumbar radiculopathy 09/18/2020   Mixed hyperlipidemia 05/14/2020   Heart murmur 05/14/2020   COVID 03/09/2020   Routine general medical examination at a health care facility 07/10/2017   Chronic diarrhea 02/28/2016   Osteoarthritis 09/11/2015   Morbid obesity ([place]) 12/15/2014   Essential hypertension 12/15/2014   Gastro-esophageal reflux disease without esophagitis 07/03/2011   Christine Ramirez is an 55 y.o. 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 intolerance Severe obesity  Etiology of pain unclear - no signs of internal hernia, leak, perforation, obstruction. Symptoms most c/w ischemic ulcer at G-J perhaps Cont empiric bid protonix and carafate Cont levsin Cont  gabapentin for pain control to try to minimize opioids MOM prn, cont scheduled miralax Cont IVF Cont chemical vte prophylaxis UGI unremarkable Ambulate  Cont bari fulls as tolerated,   Disposition: slowly improving. Not ready for dc; add back mvi, told pt to try chicken soup protein shake  LOS: 2 days    Leighton Ruff. Redmond Pulling, MD, FACS General, Bariatric, & Minimally Invasive Surgery [phone] [facility]

## 2020-10-01 NOTE — Care Management (Signed)
Patient suffers from morbid obesity and lumbar radiculopathy which impairs their ability to perform daily activities like toileting, dressing, and bathing in the home. A cane or walker will not resolve issue with performing activities of daily living. A wheelchair will allow patient to safely perform daily activities. Patient can safely propel the wheelchair in the home or has a caregiver who can provide assistance.

## 2020-10-01 NOTE — TOC Initial Note (Signed)
Transition of Care [facility]) - Initial/Assessment Note   Patient Details  Name: Christine Ramirez MRN: [id number] Date of Birth: 1965/05/18  [facility] CM/SW Contact:    Sherie Don, LCSW Phone Number: 10/01/2020, 1:25 PM  Clinical Narrative: TOC received consult for DME needs. CSW met with patient to discuss DME. Per patient, she will need a shower chair, rolling walker, and wheelchair. CSW explained the shower chair will be private pay, but patient is agreeable to DME referral to [employer].  CSW made DME referral to [facility] with [employer]. Message left with general surgeon's office for DME orders. Wheelchair narrative is done and ready for a cosign. TOC awaiting DME orders for a heavy duty 24" wheelchair and bariatric rolling walker.  Expected Discharge Plan: Home/Self Care  Patient Goals and CMS Choice Patient states their goals for this hospitalization and ongoing recovery are:: Get DME for home use CMS Medicare.gov Compare Post Acute Care list provided to:: Patient Choice offered to / list presented to : Patient  Expected Discharge Plan and Services Expected Discharge Plan: Home/Self Care In-house Referral: Clinical Social Work Post Acute Care Choice: Durable Medical Equipment Living arrangements for the past 2 months: [place]          DME Arranged: Wheelchair manual, Walker rolling DME Agency: [employer] Date DME Agency Contacted: 10/01/20 Representative spoke with at DME Agency: Freda Munro  Prior Living Arrangements/Services Living arrangements for the past 2 months: Single Family Home Lives with:: Spouse Patient language and need for interpreter reviewed:: Yes Do you feel safe going back to the place where you live?: Yes      Need for Family Participation in Patient Care: Yes (Comment) Care giver support system in place?: Yes (comment) Criminal Activity/Legal Involvement Pertinent to Current Situation/Hospitalization: No - Comment as needed  Activities of  Daily Living Home Assistive Devices/Equipment: Environmental consultant (specify type), Eyeglasses (reading glasses) ADL Screening (condition at time of admission) Patient's cognitive ability adequate to safely complete daily activities?: Yes Is the patient deaf or have difficulty hearing?: No Does the patient have difficulty seeing, even when wearing glasses/contacts?: No Does the patient have difficulty concentrating, remembering, or making decisions?: No Patient able to express need for assistance with ADLs?: Yes Does the patient have difficulty dressing or bathing?: No Independently performs ADLs?: No Communication: Independent Dressing (OT): Independent Grooming: Independent Feeding: Independent Bathing: Independent Toileting: Needs assistance Is this a change from baseline?: Change from baseline, expected to last >3days In/Out Bed: Needs assistance Is this a change from baseline?: Change from baseline, expected to last >3 days Walks in Home: Needs assistance Is this a change from baseline?: Change from baseline, expected to last >3 days Does the patient have difficulty walking or climbing stairs?: Yes Weakness of Legs: Both Weakness of Arms/Hands: Both  Permission Sought/Granted Permission sought to share information with : Other (comment) Permission granted to share information with : Yes, Verbal Permission Granted Permission granted to share info w AGENCY: DME companies  Emotional Assessment Appearance:: Appears stated age Attitude/Demeanor/Rapport: Engaged Affect (typically observed): Accepting Orientation: : Oriented to Self, Oriented to  Time, Oriented to Place, Oriented to Situation Alcohol / Substance Use: Not Applicable Psych Involvement: No (comment)  Admission diagnosis:  LUQ pain [R10.12] Personal history of gastric bypass [Z98.84] Pain of upper abdomen [R10.10] Patient Active Problem List   Diagnosis Date Noted   LUQ pain 09/28/2020   OSA (obstructive sleep apnea) - mild  09/18/2020   Prediabetes 09/18/2020   Lumbar radiculopathy 09/18/2020  Mixed hyperlipidemia 05/14/2020   Heart murmur 05/14/2020   COVID 03/09/2020   Routine general medical examination at a health care facility 07/10/2017   Chronic diarrhea 02/28/2016   Osteoarthritis 09/11/2015   Morbid obesity ([place]) 12/15/2014   Essential hypertension 12/15/2014   Gastro-esophageal reflux disease without esophagitis 07/03/2011   PCP:  Hoyt Koch, MD Pharmacy:   [id number] [place], [id number]: [phone]: [phone] Readmission Risk Interventions No flowsheet data found.

## 2020-10-02 MED ORDER — GABAPENTIN 250 MG/5ML PO SOLN: 200.0000 mg | Freq: Two times a day (BID) | ORAL | 0 refills | Status: DC | PRN

## 2020-10-02 MED ORDER — HYOSCYAMINE SULFATE 0.125 MG SL SUBL: 0.1250 mg | SUBLINGUAL_TABLET | Freq: Four times a day (QID) | SUBLINGUAL | 0 refills | Status: DC | PRN

## 2020-10-02 MED ORDER — PANTOPRAZOLE SODIUM 40 MG PO TBEC: 40.0000 mg | DELAYED_RELEASE_TABLET | Freq: Two times a day (BID) | ORAL | 0 refills | Status: DC

## 2020-10-02 MED ORDER — CENTRUM ADULTS PO CHEW: 1.0000 | CHEWABLE_TABLET | Freq: Every day | ORAL | Status: DC

## 2020-10-02 MED ORDER — SUCRALFATE 1 GM/10ML PO SUSP: 1.0000 g | Freq: Three times a day (TID) | ORAL | 0 refills | Status: DC

## 2020-10-02 MED ORDER — CENTRUM FRESH/FRUITY ADULT PO CHEW: 1.0000 | CHEWABLE_TABLET | Freq: Every day | ORAL | Status: DC

## 2020-10-02 MED ORDER — ONDANSETRON 4 MG PO TBDP: 4.0000 mg | ORAL_TABLET | Freq: Four times a day (QID) | ORAL | 0 refills | Status: DC | PRN

## 2020-10-02 MED ORDER — POLYETHYLENE GLYCOL 3350 17 G PO PACK: 17.0000 g | PACK | Freq: Every day | ORAL | 0 refills | Status: DC | PRN

## 2020-10-02 NOTE — Discharge Instructions (Addendum)
CRUSH protonix and mix with some water and take twice a day Take carafate before thick liquids.  If carafate comes in a pill form, crush and mix with some water.  Goal is 64 oz of liquid per day (water, crystal light water, PROTEIN shakes ) including 60 grams of protein daily Call office with status of oral intake by noon Wednesday      GASTRIC BYPASS/SLEEVE  Home Care Instructions   These instructions are to help you care for yourself when you go home.  Call: If you have any problems. Call [phone] and ask for the surgeon on call If you need immediate help, come to the ER at [facility].  Tell the ER staff that you are a new post-op gastric bypass or gastric sleeve patient   Signs and symptoms to report: Severe vomiting or nausea If you cannot keep down clear liquids for longer than 1 day, call your surgeon  Abdominal pain that does not get better after taking your pain medication Fever over 100.4 F with chills Heart beating over 100 beats a minute Shortness of breath at rest Chest pain  Redness, swelling, drainage, or foul odor at incision (surgical) sites  If your incisions open or pull apart Swelling or pain in calf (lower leg) Diarrhea (Loose bowel movements that happen often), frequent watery, uncontrolled bowel movements Constipation, (no bowel movements for 3 days) if this happens: Pick one Milk of Magnesia, 2 tablespoons by mouth, 3 times a day for 2 days if needed Stop taking Milk of Magnesia once you have a bowel movement Call your doctor if constipation continues Or Miralax  (instead of Milk of Magnesia) following the label instructions Stop taking Miralax once you have a bowel movement Call your doctor if constipation continues Anything you think is not normal   Normal side effects after surgery: Unable to sleep at night or unable to focus Irritability or moody Being tearful (crying) or depressed These are common complaints, possibly related to your  anesthesia medications that put you to sleep, stress of surgery, and change in lifestyle.  This usually goes away a few weeks after surgery.  If these feelings continue, call your primary care doctor.   Wound Care: You may have surgical glue, steri-strips, or staples over your incisions after surgery Surgical glue:  Looks like a clear film over your incisions and will wear off a little at a time Steri-strips: Strips of tape over your incisions. You may notice a yellowish color on the skin under the steri-strips. This is used to make the   steri-strips stick better. Do not pull the steri-strips off - let them fall off Staples: Staples may be removed before you leave the hospital If you go home with staples, call [facility], [phone] at for an appointment with your surgeon's nurse to have staples removed 10 days after surgery. Showering: You may shower two (2) days after your surgery unless your surgeon tells you differently Wash gently around incisions with warm soapy water, rinse well, and gently pat dry  No tub baths until staples are removed, steri-strips fall off or glue is gone.    Medications: Medications should be liquid or crushed if larger than the size of a dime Extended release pills (medication that release a little bit at a time through the day) should NOT be crushed or cut. (examples include XL, ER, DR, SR) Depending on the size and number of medications you take, you may need to space (take a few  throughout the day)/change the time you take your medications so that you do not over-fill your pouch (smaller stomach) Make sure you follow-up with your primary care doctor to make medication changes needed during rapid weight loss and life-style changes If you have diabetes, follow up with the doctor that orders your diabetes medication(s) within one week after surgery and check your blood sugar regularly. Do not drive while taking prescription pain medication  It is ok  to take Tylenol by the bottle instructions with your pain medicine or instead of your pain medicine as needed.  DO NOT TAKE NSAIDS (EXAMPLES OF NSAIDS:  IBUPROFREN/ NAPROXEN)  Diet:                    First 2 Weeks  You will see the dietician t about two (2) weeks after your surgery. The dietician will increase the types of foods you can eat if you are handling liquids well: If you have severe vomiting or nausea and cannot keep down clear liquids lasting longer than 1 day, call your surgeon @ [phone]) Protein Shake Drink at least 2 ounces of shake 5-6 times per day Each serving of protein shakes (usually 8 - 12 ounces) should have: 15 grams of protein  And no more than 5 grams of carbohydrate  Goal for protein each day: Men = 80 grams per day Women = 60 grams per day Protein powder may be added to fluids such as non-fat milk or Lactaid milk or unsweetened Soy/Almond milk (limit to 35 grams added protein powder per serving)  Hydration Slowly increase the amount of water and other clear liquids as tolerated (See Acceptable Fluids) Slowly increase the amount of protein shake as tolerated   Sip fluids slowly and throughout the day.  Do not use straws. May use sugar substitutes in small amounts (no more than 6 - 8 packets per day; i.e. Splenda)  Fluid Goal The first goal is to drink at least 8 ounces of protein shake/drink per day (or as directed by the nutritionist); some examples of protein shakes are [employer], Dillard's, EAS Edge HP, and Unjury. See handout from pre-op Bariatric Education Class: Slowly increase the amount of protein shake you drink as tolerated You may find it easier to slowly sip shakes throughout the day It is important to get your proteins in first Your fluid goal is to drink 64 - 100 ounces of fluid daily It may take a few weeks to build up to this 32 oz (or more) should be clear liquids  And  32 oz (or more) should be full liquids (see below for  examples) Liquids should not contain sugar, caffeine, or carbonation  Clear Liquids: Water or Sugar-free flavored water (i.e. Fruit H2O, Propel) Decaffeinated coffee or tea (sugar-free) Crystal Lite, Wyler's Lite, Minute Maid Lite Sugar-free Jell-O Bouillon or broth Sugar-free Popsicle:   *Less than 20 calories each; Limit 1 per day  Full Liquids: Protein Shakes/Drinks + 2 choices per day of other full liquids Full liquids must be: No More Than 15 grams of Carbs per serving  No More Than 3 grams of Fat per serving Strained low-fat cream soup (except Cream of Potato or Tomato) Non-Fat milk Fat-free Lactaid Milk Unsweetened Soy Or Unsweetened Almond Milk Low Sugar yogurt (Dannon Lite & Fit, Greek yogurt; Oikos Triple Zero; Chobani Simply 100; Yoplait 100 calorie [place] - No Fruit on the Bottom)    Vitamins and Minerals Start 1 day after surgery unless otherwise directed  by your surgeon 2 Chewable Bariatric Specific Multivitamin / Multimineral Supplement with iron (Example: Bariatric Advantage Multi EA) Chewable Calcium with Vitamin D-3 (Example: 3 Chewable Calcium Plus 600 with Vitamin D-3) Take 500 mg three (3) times a day for a total of 1500 mg each day Do not take all 3 doses of calcium at one time as it may cause constipation, and you can only absorb 500 mg  at a time  Do not mix multivitamins containing iron with calcium supplements; take 2 hours apart Menstruating women and those with a history of anemia (a blood disease that causes weakness) may need extra iron Talk with your doctor to see if you need more iron Do not stop taking or change any vitamins or minerals until you talk to your dietitian or surgeon Your Dietitian and/or surgeon must approve all vitamin and mineral supplements   Activity and Exercise: Limit your physical activity as instructed by your doctor.  It is important to continue walking at home.  During this time, use these guidelines: Do not lift anything  greater than ten (10) pounds for at least two (2) weeks Do not go back to work or drive until Designer, industrial/product says you can You may have sex when you feel comfortable  It is VERY important for female patients to use a reliable birth control method; fertility often increases after surgery  All hormonal birth control will be ineffective for 30 days after surgery due to medications given during surgery a barrier method must be used. Do not get pregnant for at least 18 months Start exercising as soon as your doctor tells you that you can Make sure your doctor approves any physical activity Start with a simple walking program Walk 5-15 minutes each day, 7 days per week.  Slowly increase until you are walking 30-45 minutes per day Consider joining our BELT program. [phone] or email [email or web address]   Special Instructions Things to remember: Use your CPAP when sleeping if this applies to you  [facility] has two free Bariatric Surgery Support Groups that meet monthly The 3rd Thursday of each month, 6 pm, [facility] Classrooms  The 2nd Friday of each month, 11:45 am in the private dining room in the basement of [facility] It is very important to keep all follow up appointments with your surgeon, dietitian, primary care physician, and behavioral health practitioner Routine follow up schedule with your surgeon include appointments at 2-3 weeks, 6-8 weeks, 6 months, and 1 year at a minimum.  Your surgeon may request to see you more often.   After the first year, please follow up with your bariatric surgeon and dietitian at least once a year in order to maintain best weight loss results [facility]: [phone] [facility]: [phone] Bariatric Nurse Coordinator: [phone]      Reviewed and Endorsed  by [facility] Patient Education Committee, June, 2016 Edits Approved: Aug, 2018

## 2020-10-02 NOTE — Evaluation (Signed)
Physical Therapy One Time Evaluation Patient Details Name: Christine Ramirez MRN: [id number] DOB: 04-05-1965 Today's Date: 10/02/2020   History of Present Illness  Patient is a 55 year old female who presented to the hospital on 09/28/20 with LUQ pain and poor oral intake. patient was noted to have had gastric bypass on 09/18/20. NGE:[id number], depression, GERD, obesity, HTN, COPD.  Clinical Impression  Patient evaluated by Physical Therapy with no further acute PT needs identified. All education has been completed and the patient has no further questions.  Pt eager to continue ambulating more at home and losing weight.  Pt anticipates d/c home soon and has good support from spouse.  See below for any follow-up Physical Therapy or equipment needs. PT is signing off. Thank you for this referral.        Follow Up Recommendations No PT follow up    Equipment Recommendations  Rolling walker with 5" wheels    Recommendations for Other Services       Precautions / Restrictions Precautions Precautions: None Restrictions Weight Bearing Restrictions: No      Mobility  Bed Mobility Overal bed mobility: Needs Assistance Bed Mobility: Supine to Sit     Supine to sit: Supervision     General bed mobility comments: pt in recliner    Transfers Overall transfer level: Modified independent Equipment used: None             General transfer comment: patient was able to transfer off edge of bed to recliner in room with supervision using IV pole and increased time. patient was educated on using RW for safety and to prevent furniture walking at home. patient verbalized understanding.  Ambulation/Gait Ambulation/Gait assistance: Supervision;Modified independent (Device/Increase time) Gait Distance (Feet): 800 Feet Assistive device: Rolling walker (2 wheeled) Gait Pattern/deviations: Step-through pattern;Decreased stride length     General Gait Details: slow but steady pace, cues for use  of RW, RW utilized for endurance and pt reports chronic knee pain and occasional instability leading to falls, moderate SOB with exertion however baseline for pt  Stairs            Wheelchair Mobility    Modified Rankin (Stroke Patients Only)       Balance Overall balance assessment: Needs assistance Sitting-balance support: Feet supported Sitting balance-Leahy Scale: Fair     Standing balance support: No upper extremity supported Standing balance-Leahy Scale: Fair                               Pertinent Vitals/Pain Pain Assessment: No/denies pain    Home Living Family/patient expects to be discharged to:: Private residence Living Arrangements: Spouse/significant other Available Help at Discharge: Family;Available PRN/intermittently Type of Home: House Home Access: Level entry     Home Layout: One level Home Equipment: None      Prior Function Level of Independence: Needs assistance   Gait / Transfers Assistance Needed: patient reported furniture walking at home to get around.  ADL's / Homemaking Assistance Needed: patient reported having assistance from husband for ADLs as needed. patient reported LB dressing and toileting hygiene were areas that husband assisted with the most.        Hand Dominance        Extremity/Trunk Assessment   Upper Extremity Assessment Upper Extremity Assessment: Overall WFL for tasks assessed    Lower Extremity Assessment Lower Extremity Assessment: Overall WFL for tasks assessed    Cervical / Trunk Assessment  Cervical / Trunk Assessment: Normal  Communication   Communication: No difficulties  Cognition Arousal/Alertness: Awake/alert Behavior During Therapy: WFL for tasks assessed/performed Overall Cognitive Status: Within Functional Limits for tasks assessed                                        General Comments      Exercises     Assessment/Plan    PT Assessment Patent does  not need any further PT services  PT Problem List         PT Treatment Interventions      PT Goals (Current goals can be found in the Care Plan section)  Acute Rehab PT Goals PT Goal Formulation: All assessment and education complete, DC therapy    Frequency     Barriers to discharge        Co-evaluation               AM-PAC PT "6 Clicks" Mobility  Outcome Measure Help needed turning from your back to your side while in a flat bed without using bedrails?: None Help needed moving from lying on your back to sitting on the side of a flat bed without using bedrails?: None Help needed moving to and from a bed to a chair (including a wheelchair)?: None Help needed standing up from a chair using your arms (e.g., wheelchair or bedside chair)?: None Help needed to walk in hospital room?: A Little Help needed climbing 3-5 steps with a railing? : A Little 6 Click Score: 22    End of Session   Activity Tolerance: Patient tolerated treatment well Patient left: in chair;with call bell/phone within reach Nurse Communication: Mobility status PT Visit Diagnosis: Difficulty in walking, not elsewhere classified (R26.2)    Time: 7672-0947 PT Time Calculation (min) (ACUTE ONLY): 22 min   Charges:   PT Evaluation $PT Eval Low Complexity: 1 Low        Kati PT, DPT Acute Rehabilitation Services Pager: [phone] Office: [phone]   Maida Sale E 10/02/2020, 12:27 PM

## 2020-10-02 NOTE — Discharge Summary (Signed)
Physician Discharge Summary  Christine Ramirez:[id number] DOB: [birth year] DOA: 09/28/2020  PCP: Myrlene Broker, MD  Admit date: 09/28/2020 Discharge date: 10/02/2020  Recommendations for Outpatient Follow-up:     Follow-up Information     Gaynelle Adu, MD Follow up.   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 female who is here for evaluation because of pain with oral intake, LUQ pain.  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  She called into the clinic earlier this week with some low volume oral intake.  She was sent for IV hydration.  At that time her labs were unremarkable.  She also felt full and bloated.  She was instructed to increase her MiraLAX regimen as well as to take milk of magnesia.  She states that she did that yesterday and it really helped she no longer felt full and had several good bowel movements.  However she called early this morning complaining of inability to tolerate liquids.  She states that when she drinks liquids she would have immediate left upper quadrant severe pain.  She only had tolerated about 2 to 4 ounces of water yesterday.  I therefore instructed her to come to the emergency room for evaluation.  She denies any fevers or chills.  The fullness and bloatedness that she had has significantly improved.  But she still was having  pain with oral intake.   She also states that she had pain on the left side when taking a deep breath   No fever or chills.  No retching. She has been doing twice daily Lovenox prophylactic therapy.  Her last dose was this morning.   Labs in the ER were unremarkable.  She also underwent a CT scan this afternoon which showed no signs of perforation, leak, or free fluid, internal hernia or bowel obstruction.  Hospital Course:  She was admitted for hydration and observation.  Her oral intake was not sufficient to be dismissed from the emergency room.  She was started on empiric medication for an ulcer at her gastrojejunal anastomosis.  She was placed on twice daily Protonix as well as liquid Carafate.  She was continued on bariatric full liquid diet and including protein shakes.  She has been on scheduled MiraLAX and as needed milk of magnesia.  She also had institution of scheduled nonopioid pain medication.  She was continued on twice daily prophylactic Lovenox therapy.  Her pain slowly improved throughout her hospitalization.  Oral intake slowly improved as well.  I obtained an upper GI on Sunday which demonstrated again no evidence of leak or obstruction or stricture.  We had PT and OT evaluate the patient because of her pre-existing mobility issues.  On Tuesday she was doing better.  She had tolerated a protein shake and a half on Monday in addition to water and Crystal light and G2 Gatorade.  I felt she was stable  for discharge.  We will continue her on a bariatric full liquid diet and delay transition to the next phase Continue on twice daily crushed Protonix and liquid Carafate q. ACH S Continue and finish out her home Lovenox prophylactic regimen As needed MiraLAX for constipation  BP (!) 104/55 (BP Location: Left Arm)   Pulse 67   Temp 97.7 F (36.5 C) (Oral)   Resp 16   Ht 5\' 1"  (1.549 m)   Wt (!) 140 kg   SpO2 97%   BMI 58.32 kg/m   Gen: alert, NAD, non-toxic appearing Pupils:  equal, no scleral icterus Pulm: Lungs clear to auscultation, symmetric chest rise CV: regular rate and rhythm Abd: soft, min tender, nondistended. Well-healed trocar sites. No cellulitis. No incisional hernia Ext: no edema, no calf tenderness Skin: no rash, no jaundice    Discharge Instructions  Discharge Instructions     Ambulate hourly while awake   Complete by: As directed    Call MD for:  difficulty breathing, headache or visual disturbances   Complete by: As directed    Call MD for:  persistant dizziness or light-headedness   Complete by: As directed    Call MD for:  persistant nausea and vomiting   Complete by: As directed    Call MD for:  redness, tenderness, or signs of infection (pain, swelling, redness, odor or green/yellow discharge around incision site)   Complete by: As directed    Call MD for:  severe uncontrolled pain   Complete by: As directed    Call MD for:  temperature >101 F   Complete by: As directed    Diet bariatric full liquid   Complete by: As directed    Discharge instructions   Complete by: As directed    See bariatric discharge instructions   Incentive spirometry   Complete by: As directed    Perform hourly while awake      Allergies as of 10/02/2020   No Known Allergies      Medication List     TAKE these medications    acetaminophen 500 MG tablet Commonly known as: TYLENOL Take 1,000 mg by mouth every 6 (six) hours as needed for moderate pain.   Calcium Carbonate 500 MG Chew Chew 1 tablet by mouth in the morning, at noon, and at bedtime.   enoxaparin 40 MG/0.4ML injection Commonly known as: LOVENOX Inject 0.4 mLs (40 mg total) into the skin 2 (two) times daily for 14 days.   gabapentin 250 MG/5ML solution Commonly known as: NEURONTIN Take 4 mLs (200 mg total) by mouth 2 (two) times daily as needed for up to 5 days.   hyoscyamine 0.125 MG SL tablet Commonly known as: LEVSIN SL Place 1 tablet (0.125 mg total) under the tongue  every 6 (six) hours as needed for cramping.   Multivitamin Adult Chew Chew 1 tablet by mouth every morning.   ondansetron 4 MG disintegrating tablet Commonly known as: ZOFRAN-ODT Take 1 tablet (4 mg total) by mouth every 6 (six) hours as needed for nausea or vomiting.   pantoprazole 40 MG tablet Commonly known as: PROTONIX Take 1 tablet (40 mg total) by mouth 2 (two) times daily. What changed: when to take this   polyethylene glycol 17 g packet Commonly known as: MIRALAX / GLYCOLAX Take 17 g by mouth daily as needed.   sucralfate 1 GM/10ML suspension Commonly known as: CARAFATE Take 10 mLs (1 g total) by mouth 4 (four) times daily -  with meals  and at bedtime.   traMADol 50 MG tablet Commonly known as: ULTRAM Take 1 tablet (50 mg total) by mouth every 6 (six) hours as needed (pain).       ASK your doctor about these medications    DULoxetine 60 MG capsule Commonly known as: CYMBALTA TAKE 1 CAPSULE BY MOUTH EVERY DAY   lisinopril 5 MG tablet Commonly known as: ZESTRIL Take 1 tablet (5 mg total) by mouth daily. Needs appointment before next refill               Durable Medical Equipment  (From admission, onward)           Start     Ordered   10/02/20 1238  For home use only DME Walker rolling  Once       Comments: bariatric  Question Answer Comment  Walker: With 5 Inch Wheels   Patient needs a walker to treat with the following condition Lumbar radiculopathy, chronic      10/02/20 1237   10/02/20 1238  For home use only DME standard manual wheelchair with seat cushion  Once       Comments: Patient suffers from severe obesity & lumbar radiculopathy which impairs their ability to perform daily activities like grooming in the home.  A walker will not resolve issue with performing activities of daily living. A wheelchair will allow patient to safely perform daily activities. Patient can safely propel the wheelchair in the home or has a caregiver who can  provide assistance. Length of need 6 months . Accessories: elevating leg rests (ELRs), wheel locks, extensions and anti-tippers.  Heavy duty, 24"   10/02/20 1238   10/02/20 1213  For home use only DME Tub bench  Once        10/02/20 1212   10/02/20 1212  For home use only DME 3 n 1  Once        10/02/20 1211            Follow-up Information     Gaynelle Adu, MD Follow up.   Specialty: General Surgery Why: keep previously scheduled appt Contact information: [address] [place] [address] [phone]                  The results of significant diagnostics from this hospitalization (including imaging, microbiology, ancillary and laboratory) are listed below for reference.    Significant Diagnostic Studies: CT ABDOMEN PELVIS W CONTRAST  Result Date: 09/28/2020 CLINICAL DATA:  Bowel obstruction suspected, abdominal pain, status post gastric bypass EXAM: CT ABDOMEN AND PELVIS WITH CONTRAST TECHNIQUE: Multidetector CT imaging of the abdomen and pelvis was performed using the standard protocol following bolus administration of intravenous contrast. CONTRAST:  19mL OMNIPAQUE IOHEXOL 350 MG/ML SOLN, additional oral enteric contrast COMPARISON:  None. FINDINGS: Lower chest: No acute abnormality. Bandlike scarring and or atelectasis of the bilateral lung bases. Hepatobiliary: No focal liver abnormality is seen. Status post cholecystectomy. No biliary dilatation. Pancreas: Unremarkable. No pancreatic ductal dilatation or surrounding inflammatory changes. Spleen: Normal in size without significant abnormality. Adrenals/Urinary Tract: Adrenal glands are unremarkable. Kidneys are normal, without renal calculi, solid lesion, or hydronephrosis. Bladder is unremarkable. 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 No evidence of bowel wall thickening, distention, or inflammatory changes. Vascular/Lymphatic: Scattered aortic atherosclerosis. No enlarged  abdominal or pelvic lymph nodes. Reproductive: No mass or other significant abnormality. Bilateral ovarian cysts measuring 2.7 cm on the right and 2.0 cm on the left (series 2,  image 70). No follow-up imaging recommended. Note: This recommendation does not apply to premenarchal patients and to those with increased risk (genetic, family history, elevated tumor markers or other high-risk factors) of ovarian cancer. Reference: JACR 2020 Feb; 17(2):248-254 Other: No abdominal wall hernia or abnormality. No abdominopelvic ascites. Musculoskeletal: No acute or significant osseous findings. IMPRESSION: 1. No acute CT findings of the abdomen or pelvis to explain pain. 2. 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 No evidence of bowel obstruction or other complication. 3. Status post cholecystectomy. Aortic Atherosclerosis (ICD10-I70.0). Electronically Signed   By: Lauralyn Primes M.D.   On: 09/28/2020 15:05   DG UGI W SINGLE CM (SOL OR THIN BA)  Result Date: 09/30/2020 CLINICAL DATA:  Status post Roux-en-Y gastric bypass on 09/17/2020. Initially patient did well. However, she complains of LEFT UPPER QUADRANT abdominal pain when she drinks liquids. EXAM: WATER SOLUBLE UPPER GI SERIES TECHNIQUE: Single-column upper GI series was performed using water soluble contrast. CONTRAST:  Water-soluble contrast COMPARISON:  CT of the abdomen and pelvis on 09/28/2020 FLUOROSCOPY TIME:  Fluoroscopy Time:  1 minutes 42 seconds Radiation Exposure Index (if provided by the fluoroscopic device): 58.1 mGy FINDINGS: Patient swallows contrast without difficulty. There is to an fro peristalsis of the esophagus when the patient is semi-erect or erect. Note is made of significant gastroesophageal reflux to level of the thoracic inlet when the patient is standing. The gastroesophageal junction opens intermittently to normal caliber. Gastrojejunal anastomosis appears unremarkable. Contrast passes into the proximal jejunum, demonstrating no  filling defects or obstruction. The patient's LEFT UPPER QUADRANT pain was not reproduced during the exam. IMPRESSION: 1. Significant gastroesophageal reflux to level of the thoracic inlet. 2. Intermittent opening of the gastroesophageal junction to normal appearing caliber. Barium tablet was not administered due to recent postoperative status. 3. No evidence for contrast leak or obstruction. 4. Normal appearance of the gastrojejunal anastomosis. 5. Patient tolerated procedure well. The reported LEFT UPPER QUADRANT pain was not reproduced during the exam. Electronically Signed   By: Norva Pavlov M.D.   On: 09/30/2020 14:16    Microbiology: Recent Results (from the past 240 hour(s))  SARS CORONAVIRUS 2 (TAT 6-24 HRS) Nasopharyngeal Nasopharyngeal Swab     Status: None   Collection Time: 09/28/20  6:03 PM   Specimen: Nasopharyngeal Swab  Result Value Ref Range Status   SARS Coronavirus 2 NEGATIVE NEGATIVE Final    Comment: (NOTE) SARS-CoV-2 target nucleic acids are NOT DETECTED.  The SARS-CoV-2 RNA is generally detectable in upper and lower respiratory specimens during the acute phase of infection. Negative results do not preclude SARS-CoV-2 infection, do not rule out co-infections with other pathogens, and should not be used as the sole basis for treatment or other patient management decisions. Negative results must be combined with clinical observations, patient history, and epidemiological information. The expected result is Negative.  Fact Sheet for Patients: HairSlick.no  Fact Sheet for Healthcare Providers: quierodirigir.com  This test is not yet approved or cleared by the [place] FDA and  has been authorized for detection and/or diagnosis of SARS-CoV-2 by FDA under an Emergency Use Authorization (EUA). This EUA will remain  in effect (meaning this test can be used) for the duration of the COVID-19 declaration under Se  ction 564(b)(1) of the Act, 21 U.S.C. section 360bbb-3(b)(1), unless the authorization is terminated or revoked sooner.  Performed at [facility], [address], [place], [place] [address]      Labs: Basic Metabolic Panel: Recent  Labs  Lab 09/26/20 0952 09/28/20 1046 09/29/20 0506 10/01/20 0320  NA 133* 139 138 140  K 3.6 4.3 3.6 4.0  CL 101 105 104 111  CO2 23 24 23 23   GLUCOSE 104* 94 117* 113*  BUN 14 11 8  <5*  CREATININE 0.77 0.68 0.57 0.68  CALCIUM 8.9 8.9 8.7* 8.7*  MG 2.0  --  2.0  --    Liver Function Tests: Recent Labs  Lab 09/26/20 0952 09/28/20 1046  AST 19 19  ALT 28 26  ALKPHOS 56 55  BILITOT 0.8 0.6  PROT 6.9 7.3  ALBUMIN 3.5 3.8   Recent Labs  Lab 09/28/20 1046  LIPASE 69*   No results for input(s): AMMONIA in the last 168 hours. CBC: Recent Labs  Lab 09/26/20 0952 09/28/20 1046 09/29/20 0506 09/30/20 0403 10/01/20 0320  WBC 9.6 8.0 7.8 7.4 7.1  NEUTROABS 6.9 5.9  --   --   --   HGB 11.6* 11.8* 10.7* 10.2* 12.3  HCT 36.0 38.4 35.3* 33.8* 40.0  MCV 87.6 89.7 89.6 89.9 88.9  PLT 296 303 281 280 230   Cardiac Enzymes: No results for input(s): CKTOTAL, CKMB, CKMBINDEX, TROPONINI in the last 168 hours. BNP: BNP (last 3 results) No results for input(s): BNP in the last 8760 hours.  ProBNP (last 3 results) No results for input(s): PROBNP in the last 8760 hours.  CBG: No results for input(s): GLUCAP in the last 168 hours.  Active Problems:   LUQ pain   Time coordinating discharge: 15 min  Signed:  11/30/20, MD [facility], [place] [phone] 10/02/2020, 3:35 PM

## 2020-10-02 NOTE — TOC Progression Note (Addendum)
Transition of Care [facility]) - Progression Note   Patient Details  Name: Christine Ramirez MRN: [id number] Date of Birth: 11/05/1965  Transition of Care [facility]) CM/SW Contact  Ewing Schlein, LCSW Phone Number: 10/02/2020, 1:07 PM  Clinical Narrative: DME orders have been placed and wheelchair narrative is signed. CSW spoke with Velna Hatchet with [employer]. Per Velna Hatchet, [employer] will only cover one out of the wheelchair and bariatric rolling walker. Patient is aware a tub bench or shower stool will be private pay. [employer] will deliver DME to patient's room based on what she chooses to bill through her insurance. TOC to follow.  Addendum: Due to a power outage at [employer], the patient's DME will be delivered to her home.  Expected Discharge Plan: Home/Self Care  Expected Discharge Plan and Services Expected Discharge Plan: Home/Self Care In-house Referral: Clinical Social Work Post Acute Care Choice: Durable Medical Equipment Living arrangements for the past 2 months: Single Family Home           DME Arranged: Wheelchair manual, Walker rolling DME Agency: [employer] Date DME Agency Contacted: 10/01/20 Representative spoke with at DME Agency: Velna Hatchet  Readmission Risk Interventions No flowsheet data found.

## 2020-10-02 NOTE — Evaluation (Signed)
Occupational Therapy Evaluation Patient Details Name: Christine Ramirez MRN: [id number] DOB: [birth year] Today's Date: 10/02/2020    History of Present Illness Patient is a 55 year old female who presented to the hospital on 09/28/20 with LUQ pain and poor oral intake. patient was noted to have had gastric bypass on 09/18/20. QMG:NOIBBCW, depression, GERD, obesity.   Clinical Impression   Patient evaluated by Occupational Therapy with no further acute OT needs identified. All education has been completed and the patient has no further questions. Patient was educated on total hip kit and toileting wand. Patient verbalized and demonstrated understanding to use AE for LB dressing and toileting hygiene tasks. Patient reported having husband 24/7 support at home.  See below for any follow-up Occupational Therapy or equipment needs. OT is signing off. Thank you for this referral.     Follow Up Recommendations  No OT follow up;Supervision/Assistance - 24 hour    Equipment Recommendations  Tub/shower seat;3 in 1 bedside commode;Other (comment) (total hip kit and toileting wand)    Recommendations for Other Services       Precautions / Restrictions Restrictions Weight Bearing Restrictions: No      Mobility Bed Mobility Overal bed mobility: Needs Assistance Bed Mobility: Supine to Sit     Supine to sit: Supervision     General bed mobility comments: with HOB raised. patient reported sleeping in recliner at home    Transfers Overall transfer level: Modified independent Equipment used: 1 person hand held assist             General transfer comment: patient was able to transfer off edge of bed to recliner in room with supervision using IV pole and increased time. patient was educated on using RW for safety and to prevent furniture walking at home. patient verbalized understanding.    Balance Overall balance assessment: Needs assistance Sitting-balance support: Feet  supported Sitting balance-Leahy Scale: Fair     Standing balance support: Single extremity supported;During functional activity Standing balance-Leahy Scale: Fair                             ADL either performed or assessed with clinical judgement   ADL Overall ADL's : At baseline                                       General ADL Comments: patient is at her baseline for ADLs. patient was educated on using reacher, sock aid, toileting wand, long handled sponge and long handled shoe horn to reduce caregiver burden at home. patient was able to demonstrate understanding with donning/doffing socks with AE and simulating hygiene with toileting wand. patient was educated on where these items were avaialble for purchase.     Vision Patient Visual Report: No change from baseline       Perception     Praxis      Pertinent Vitals/Pain Pain Assessment: No/denies pain     Hand Dominance     Extremity/Trunk Assessment Upper Extremity Assessment Upper Extremity Assessment: Overall WFL for tasks assessed   Lower Extremity Assessment Lower Extremity Assessment: Defer to PT evaluation   Cervical / Trunk Assessment Cervical / Trunk Assessment: Normal   Communication Communication Communication: No difficulties   Cognition Arousal/Alertness: Awake/alert Behavior During Therapy: WFL for tasks assessed/performed Overall Cognitive Status: Within Functional Limits for tasks assessed  General Comments       Exercises     Shoulder Instructions      Home Living Family/patient expects to be discharged to:: Private residence Living Arrangements: Spouse/significant other Available Help at Discharge: Family;Available PRN/intermittently   Home Access: Level entry     Home Layout: One level     Bathroom Shower/Tub: Occupational psychologist: Standard     Home Equipment: None           Prior Functioning/Environment Level of Independence: Needs assistance  Gait / Transfers Assistance Needed: patient reported furniture walking at home to get around. ADL's / Homemaking Assistance Needed: patient reported having assistance from husband for ADLs as needed. patietn reported LB dressing and toileting hygiene were areas that husband assisted with the most.            OT Problem List:        OT Treatment/Interventions:      OT Goals(Current goals can be found in the care plan section)    OT Frequency:     Barriers to D/C:            Co-evaluation              AM-PAC OT "6 Clicks" Daily Activity     Outcome Measure Help from another person eating meals?: None Help from another person taking care of personal grooming?: None Help from another person toileting, which includes using toliet, bedpan, or urinal?: A Little Help from another person bathing (including washing, rinsing, drying)?: A Little Help from another person to put on and taking off regular upper body clothing?: A Little Help from another person to put on and taking off regular lower body clothing?: A Little 6 Click Score: 20   End of Session Equipment Utilized During Treatment: Other (comment) (total hip kit and toileting wand) Nurse Communication: Other (comment)  Activity Tolerance: Patient tolerated treatment well Patient left: in chair;with call bell/phone within reach  OT Visit Diagnosis: Pain;Muscle weakness (generalized) (M62.81);Repeated falls (R29.6) Pain - part of body:  (abdomen)                Time: 6886-4847 OT Time Calculation (min): 34 min Charges:  OT General Charges $OT Visit: 1 Visit OT Evaluation $OT Eval Low Complexity: 1 Low OT Treatments $Self Care/Home Management : 8-22 mins  Christine Ramirez OTR/L, MS Acute Rehabilitation Department Office# [phone] Pager# [phone]   Edith Endave 10/02/2020, 10:31 AM

## 2020-10-02 NOTE — Progress Notes (Signed)
D/C instructions given to patient. Patient had no questions. NT or writer will wheel patient out once her ride is here  

## 2020-10-03 DIAGNOSIS — R1012 Left upper quadrant pain: Secondary | ICD-10-CM | POA: Diagnosis not present

## 2020-10-03 DIAGNOSIS — R7303 Prediabetes: Secondary | ICD-10-CM | POA: Diagnosis not present

## 2020-10-04 LAB — H PYLORI, IGM, IGG, IGA AB
H Pylori IgG: 0.26 Index Value (ref 0.00–0.79)
H. Pylogi, Iga Abs: <9 units (ref 0.0–8.9)
H. Pylogi, Igm Abs: <9 units (ref 0.0–8.9)

## 2020-10-09 ENCOUNTER — Other Ambulatory Visit: Payer: Self-pay

## 2020-10-09 ENCOUNTER — Emergency Department (HOSPITAL_COMMUNITY)
Admission: EM | Admit: 2020-10-09 | Discharge: 2020-10-09 | Disposition: A | Discharge status: Home / Self Care | Payer: Federal, State, Local not specified - PPO | Attending: Emergency Medicine | Admitting: Emergency Medicine

## 2020-10-09 ENCOUNTER — Encounter (HOSPITAL_COMMUNITY): Payer: Self-pay

## 2020-10-09 DIAGNOSIS — R112 Nausea with vomiting, unspecified: Secondary | ICD-10-CM | POA: Diagnosis not present

## 2020-10-09 DIAGNOSIS — R1084 Generalized abdominal pain: Secondary | ICD-10-CM | POA: Insufficient documentation

## 2020-10-09 DIAGNOSIS — Z5321 Procedure and treatment not carried out due to patient leaving prior to being seen by health care provider: Secondary | ICD-10-CM | POA: Insufficient documentation

## 2020-10-09 DIAGNOSIS — E860 Dehydration: Secondary | ICD-10-CM | POA: Insufficient documentation

## 2020-10-09 DIAGNOSIS — R109 Unspecified abdominal pain: Secondary | ICD-10-CM | POA: Diagnosis not present

## 2020-10-09 LAB — COMPREHENSIVE METABOLIC PANEL
ALT: 32 U/L (ref 0–44)
AST: 30 U/L (ref 15–41)
Albumin: 4.1 g/dL (ref 3.5–5.0)
Alkaline Phosphatase: 61 U/L (ref 38–126)
Anion gap: 12 (ref 5–15)
BUN: 12 mg/dL (ref 6–20)
CO2: 19 mmol/L — ABNORMAL LOW (ref 22–32)
Calcium: 9 mg/dL (ref 8.9–10.3)
Chloride: 105 mmol/L (ref 98–111)
Creatinine, Ser: 0.76 mg/dL (ref 0.44–1.00)
GFR, Estimated: >60 mL/min (ref >60)
Glucose, Bld: 88 mg/dL (ref 70–99)
Potassium: 4 mmol/L (ref 3.5–5.1)
Sodium: 136 mmol/L (ref 135–145)
Total Bilirubin: 1.2 mg/dL (ref 0.3–1.2)
Total Protein: 7.5 g/dL (ref 6.5–8.1)

## 2020-10-09 LAB — LIPASE, BLOOD: Lipase: 112 U/L — ABNORMAL HIGH (ref 11–51)

## 2020-10-09 NOTE — ED Triage Notes (Signed)
Pt reports abdominal pain and N/V since having gastric bypass. Pt reports feeling dehydrated and has not been able to keep anything down.

## 2020-10-09 NOTE — ED Provider Notes (Signed)
Emergency Medicine Provider Triage Evaluation Note  Christine Ramirez , a 55 y.o. female  was evaluated in triage.  Pt complains of nausea & dry heaves with intermittent abdominal pain x 1 week that is worsening. Pain to upper belly, worse over past few nights. Took anti-emetic prior to arrival without relief. Last BM today.   Patient is S/p gastric bypass 09/18/20 by Dr. Andrey Campanile.  Review of Systems  Positive: Nausea, dry heaves, abdominal pain Negative: Constipation, diarrhea, melena, fever.  Physical Exam  BP (!) 158/97 (BP Location: Left Arm)   Pulse 78   Temp 97.8 F (36.6 C) (Oral)   Resp 20   SpO2 95%  Gen:   Awake, no distress   Resp:  Normal effort  MSK:   Moves extremities without difficulty  Other:  Upper abdominal tenderness.   Medical Decision Making  Medically screening exam initiated at 3:35 PM.  Appropriate orders placed.  Christine Ramirez was informed that the remainder of the evaluation will be completed by another provider, this initial triage assessment does not replace that evaluation, and the importance of remaining in the ED until their evaluation is complete.  Abdominal pain w/ N/V.    Desmond Lope 10/09/20 1537    Derwood Kaplan, MD 10/09/20 1724

## 2020-10-10 ENCOUNTER — Other Ambulatory Visit: Payer: Self-pay | Admitting: General Surgery

## 2020-10-10 ENCOUNTER — Other Ambulatory Visit (HOSPITAL_COMMUNITY): Payer: Self-pay | Admitting: General Surgery

## 2020-10-10 ENCOUNTER — Encounter: Payer: Federal, State, Local not specified - PPO | Attending: General Surgery | Admitting: Skilled Nursing Facility1

## 2020-10-10 ENCOUNTER — Ambulatory Visit (INDEPENDENT_AMBULATORY_CARE_PROVIDER_SITE_OTHER): Payer: Federal, State, Local not specified - PPO

## 2020-10-10 DIAGNOSIS — R110 Nausea: Secondary | ICD-10-CM

## 2020-10-10 DIAGNOSIS — E860 Dehydration: Secondary | ICD-10-CM

## 2020-10-10 DIAGNOSIS — R638 Other symptoms and signs concerning food and fluid intake: Secondary | ICD-10-CM

## 2020-10-10 DIAGNOSIS — Z9884 Bariatric surgery status: Secondary | ICD-10-CM

## 2020-10-10 MED ORDER — SODIUM CHLORIDE 0.9 % IV BOLUS
2000.0000 mL | Freq: Once | INTRAVENOUS | Status: AC
  Administered 2020-10-10: 2000 mL via INTRAVENOUS
  Filled 2020-10-10: qty 2000

## 2020-10-10 NOTE — Progress Notes (Signed)
Bariatric Nutrition Follow-Up Visit Medical Nutrition Therapy   NUTRITION ASSESSMENT    Anthropometrics  Start weight at [facility]: 318.5 lbs (date: 05/07/2020) Weight today: pt declined due to tiredness level   Clinical  Medical hx: Morbid Obesity, Anemia, COPD, Hyperlipidemia, Hypertension, GERD, depression/anxiety Medications: lisinopril Labs:  Notable signs/symptoms: stated limited mobility  Any previous deficiencies? No   Lifestyle & Dietary Hx  Pt arrives in a wheel chair pushed by her husband.   Pt states there have been no physical problems in her surgery but after a bought of constipation and then dehydration and then pain in her side coming to the conclusion of an ulcer she has struggled. Pt states she continues to feel very full after a protein shake and cannot stand them anymore. Pt states her bowels are moving fine daily being solid.  Pt states she can drink regular water without issue. Pt state she has no nausea or vomiting.  Pt states she is very frustrated with how things are progressing. Pt states her surgeon will be placing a PICC line 10/11/2020 due to her inability to meet her fluid needs.   Pt states she has not had any solid foods since surgery.   Dietitian advanced pt to soft proteins only and will advance to other protein options once it is determined pt can tolerate next phases.   Estimated daily fluid intake: rehydrating clinic today Estimated daily protein intake: unknown Supplements: no issues with taking her calcium and multivitamin other than calcium flavor  Current average weekly physical activity: states she is too tired to move as advised    24-Hr Dietary Recall First Meal:  Snack:   Second Meal:  Snack:   Third Meal:  Snack:  Beverages:   Post-Op Goals/ Signs/ Symptoms Using straws: no Drinking while eating: no Chewing/swallowing difficulties: no Changes in vision: no Changes to mood/headaches: no Hair loss/changes to skin/nails:  no Difficulty focusing/concentrating: no Sweating: no Dizziness/lightheadedness: no Palpitations: no  Carbonated/caffeinated beverages: no N/V/D/C/Gas: no Abdominal pain: no Dumping syndrome: no    NUTRITION DIAGNOSIS  Overweight/obesity (-3.3) related to past poor dietary habits and physical inactivity as evidenced by completed bariatric surgery and following dietary guidelines for continued weight loss and healthy nutrition status.     NUTRITION INTERVENTION Nutrition counseling (C-1) and education (E-2) to facilitate bariatric surgery goals, including: The importance of consuming adequate calories as well as certain nutrients daily due to the body's need for essential vitamins, minerals, and fats The importance of daily physical activity and to reach a goal of at least 150 minutes of moderate to vigorous physical activity weekly (or as directed by their physician) due to benefits such as increased musculature and improved lab values The importance of intuitive eating specifically learning hunger-satiety cues and understanding the importance of learning a new body: The importance of mindful eating to avoid grazing behaviors   Goals: -only eat the foods checked on your handout -do not sit longer than 30 minutes  -walk around the house as as often is tolerated  -try all fluids listed and aim for 64 fluid ounces   Handouts Provided Include  Phase 3  Learning Style & Readiness for Change Teaching method utilized: Visual & Auditory  Demonstrated degree of understanding via: Teach Back   RD's Notes for Next Visit Assess adherence to pt chosen goals    MONITORING & EVALUATION Dietary intake, weekly physical activity, body weight, tolerance  Next Steps Patient is to follow-up: dietitian will call pt 10/11/2020 to check on  advancement and then call Monday to determine if she can tolerate other protein options

## 2020-10-10 NOTE — Progress Notes (Signed)
Diagnosis: Dehydration  Provider:  Chilton Greathouse, MD  Procedure: Infusion  IV Type: Peripheral, IV Location: L Hand  Normal Saline, Dose: 2000cc  Infusion Start Time: 1318pm  Infusion Stop Time: 1529pm  Post Infusion IV Care: Observation period completed and Peripheral IV Discontinued  Discharge: Condition: Good, Destination: Home . AVS provided to patient.   Performed by:  Forrest Moron, RN

## 2020-10-11 ENCOUNTER — Other Ambulatory Visit (HOSPITAL_COMMUNITY): Payer: Self-pay

## 2020-10-11 ENCOUNTER — Ambulatory Visit (HOSPITAL_COMMUNITY)
Admission: RE | Admit: 2020-10-11 | Discharge: 2020-10-11 | Disposition: A | Discharge status: Home / Self Care | Payer: Federal, State, Local not specified - PPO | Source: Ambulatory Visit | Attending: General Surgery | Admitting: General Surgery

## 2020-10-11 ENCOUNTER — Other Ambulatory Visit: Payer: Self-pay

## 2020-10-11 ENCOUNTER — Telehealth: Payer: Self-pay | Admitting: Skilled Nursing Facility1

## 2020-10-11 DIAGNOSIS — R110 Nausea: Secondary | ICD-10-CM | POA: Insufficient documentation

## 2020-10-11 DIAGNOSIS — R638 Other symptoms and signs concerning food and fluid intake: Secondary | ICD-10-CM | POA: Insufficient documentation

## 2020-10-11 DIAGNOSIS — Z9884 Bariatric surgery status: Secondary | ICD-10-CM | POA: Insufficient documentation

## 2020-10-11 DIAGNOSIS — R112 Nausea with vomiting, unspecified: Secondary | ICD-10-CM | POA: Diagnosis not present

## 2020-10-11 DIAGNOSIS — Z452 Encounter for adjustment and management of vascular access device: Secondary | ICD-10-CM | POA: Diagnosis not present

## 2020-10-11 MED ORDER — HEPARIN SOD (PORK) LOCK FLUSH 100 UNIT/ML IV SOLN
INTRAVENOUS | Status: AC
  Filled 2020-10-11: qty 5

## 2020-10-11 MED ORDER — HEPARIN SOD (PORK) LOCK FLUSH 100 UNIT/ML IV SOLN
INTRAVENOUS | Status: AC
  Administered 2020-10-11: 5 [IU]
  Filled 2020-10-11: qty 5

## 2020-10-11 MED ORDER — LIDOCAINE HCL 1 % IJ SOLN
INTRAMUSCULAR | Status: AC
  Administered 2020-10-11: 2 mL via SUBCUTANEOUS
  Filled 2020-10-11: qty 20

## 2020-10-11 NOTE — Telephone Encounter (Signed)
Called pt to check on progress.   She states she was able to tolerate black eyed peas and cheese! Pt states he feels so much better!  Pt states her back pain has not gone away and has been a bigger issue for her.   Dietitian encouraged pt to keep eating and drinking and walking.

## 2020-10-11 NOTE — Procedures (Signed)
PROCEDURE SUMMARY:  Successful placement of single lumen PICC line to right basilic vein. Length 40 cm Tip at lower SVC/RA PICC capped No complications Ready for use  EBL < 5 mL   Ricky Doan H Ronia Hazelett PA-C 10/11/2020, 2:25 PM

## 2020-10-15 ENCOUNTER — Telehealth: Payer: Self-pay | Admitting: Skilled Nursing Facility1

## 2020-10-15 NOTE — Telephone Encounter (Signed)
Pt states se has been having troubles getting in her food and fluid so still working on that.  Pt states she has had no issues with getting in solid proteins including eggs.  Pt states she is still experiencing pain in her back.   Pt states she got in 48 ounces of fluid in yesterday.

## 2020-10-16 ENCOUNTER — Telehealth (HOSPITAL_COMMUNITY): Payer: Self-pay | Admitting: *Deleted

## 2020-10-16 ENCOUNTER — Other Ambulatory Visit: Payer: Self-pay | Admitting: General Surgery

## 2020-10-16 NOTE — Telephone Encounter (Signed)
Called to f/u with patient. Pt is still c/o right sided "front and back waist area" pain that is "doing better" when pt is able to drink at least 40 fl oz of water. States that Tylenol ES helps with pain, but only for a short time (around two hours of relief). According to pt, she has not been able to meet that goal within the past few days.  Pt states that she is still not able to meet 48fl oz goal, pt has consumed 32 oz of water.  Pt denies fever, chills.  Pt denies pain or discomfort with urination.  Pt states that she has voided "darker urine" three times today. MD and [facility] office is aware.  Will f/u as needed.

## 2020-10-18 ENCOUNTER — Ambulatory Visit (INDEPENDENT_AMBULATORY_CARE_PROVIDER_SITE_OTHER): Payer: Federal, State, Local not specified - PPO

## 2020-10-18 ENCOUNTER — Other Ambulatory Visit: Payer: Self-pay

## 2020-10-18 VITALS — BP 157/80 | HR 82 | Temp 98.5°F | Resp 20

## 2020-10-18 DIAGNOSIS — E860 Dehydration: Secondary | ICD-10-CM | POA: Diagnosis not present

## 2020-10-18 MED ORDER — HEPARIN SOD (PORK) LOCK FLUSH 100 UNIT/ML IV SOLN
500.0000 [IU] | Freq: Once | INTRAVENOUS | Status: AC
  Administered 2020-10-18: 500 [IU] via INTRAVENOUS
  Filled 2020-10-18: qty 5

## 2020-10-18 MED ORDER — THIAMINE HCL 100 MG/ML IJ SOLN
Freq: Once | INTRAVENOUS | Status: AC
  Filled 2020-10-18: qty 1000

## 2020-10-18 MED ORDER — SODIUM CHLORIDE 0.9 % IV BOLUS
1000.0000 mL | Freq: Once | INTRAVENOUS | Status: AC
  Administered 2020-10-18: 1000 mL via INTRAVENOUS
  Filled 2020-10-18: qty 1000

## 2020-10-18 NOTE — Progress Notes (Signed)
Diagnosis: Dehydration  Provider:  Chilton Greathouse, MD  Procedure: Infusion  IV Type: PICC, IV Location: R Upper Arm  1L NS and 1L Banana bag  Infusion Start Time: 0925  Infusion Stop Time: 1130  Post Infusion IV Care: PICC Line Dressing Changed  Discharge: Condition: Good, Destination: Home . AVS provided to patient.   Performed by:  Marilynn Rail, RN

## 2020-11-21 ENCOUNTER — Encounter: Payer: Federal, State, Local not specified - PPO | Admitting: Skilled Nursing Facility1

## 2020-11-21 ENCOUNTER — Encounter: Payer: Federal, State, Local not specified - PPO | Attending: General Surgery | Admitting: Skilled Nursing Facility1

## 2020-11-21 NOTE — Progress Notes (Signed)
Bariatric Nutrition Follow-Up Visit Medical Nutrition Therapy   NUTRITION ASSESSMENT    Anthropometrics  Start weight at [facility]: 318.5 lbs (date: 05/07/2020) Weight today: virtual appt: pt identified via name and DOB; pt agreeable to limitations of this visit type  Clinical  Medical hx: Morbid Obesity, Anemia, COPD, Hyperlipidemia, Hypertension, GERD, depression/anxiety Medications: no longer taking lisinopril, taking protonix daily Labs:  Notable signs/symptoms: stated limited mobility  Any previous deficiencies? No   Lifestyle & Dietary Hx  Pt states she is not dealing with any GI issues.  Pt states she has tried vegetables and had no issues. Pt state she feels well and has energy. Pt states she feel she is ready to walk for exercise now. Pt states she has a note form her doctor for a regular 40 hour week instead of working overtime at work.   Estimated daily fluid intake: 64 Estimated daily protein intake: 60+ Supplements: calcium and multivitamin  Current average weekly physical activity: ADL's  24-Hr Dietary Recall: one protein shake per day First Meal: eggs + bacon Snack:  protein shake Second Meal 9-10 am: steak or chicken Snack:  Third Meal: steak or chicken or beans Snack: sugar free jello or sugar free pudding or sugar free popccicle  Beverages: water, unsweet tea, water + flavorings, gatorade zero  Post-Op Goals/ Signs/ Symptoms Using straws: no Drinking while eating: no Chewing/swallowing difficulties: no Changes in vision: no Changes to mood/headaches: no Hair loss/changes to skin/nails: no Difficulty focusing/concentrating: no Sweating: no Dizziness/lightheadedness: no Palpitations: no  Carbonated/caffeinated beverages: no N/V/D/C/Gas: no Abdominal pain: no Dumping syndrome: no    NUTRITION DIAGNOSIS  Overweight/obesity ([place]-3.3) related to past poor dietary habits and physical inactivity as evidenced by completed bariatric surgery and following  dietary guidelines for continued weight loss and healthy nutrition status.     NUTRITION INTERVENTION Nutrition counseling (C-1) and education (E-2) to facilitate bariatric surgery goals, including: The importance of consuming adequate calories as well as certain nutrients daily due to the body's need for essential vitamins, minerals, and fats The importance of daily physical activity and to reach a goal of at least 150 minutes of moderate to vigorous physical activity weekly (or as directed by their physician) due to benefits such as increased musculature and improved lab values The importance of intuitive eating specifically learning hunger-satiety cues and understanding the importance of learning a new body: The importance of mindful eating to avoid grazing behaviors   Goals: -Continue to aim for a minimum of 64 fluid ounces 7 days a week with at least 30 ounces being plain water -Eat non-starchy vegetables 2 times a day 7 days a week -Start out with soft cooked vegetables today and tomorrow; if tolerated begin to eat raw vegetables or cooked including salads -Eat your 3 ounces of protein first then start in on your non-starchy vegetables; once you understand how much of your meal leads to satisfaction and not full while still eating 3 ounces of protein and non-starchy vegetables you can eat them in any order  -Continue to aim for 30 minutes of activity at least 5 times a week -Do NOT cook with/add to your food: alfredo sauce, cheese sauce, barbeque sauce, ketchup, fat back, butter, bacon grease, grease, Crisco, OR SUGAR  Handouts Provided Include  Phase 4  Learning Style & Readiness for Change Teaching method utilized: Visual & Auditory  Demonstrated degree of understanding via: Teach Back   RD's Notes for Next Visit Assess adherence to pt chosen goals    MONITORING &  EVALUATION Dietary intake, weekly physical activity, body weight, tolerance  Next Steps Patient is to follow-up  3-4 months

## 2021-01-24 ENCOUNTER — Other Ambulatory Visit: Payer: Self-pay | Admitting: Internal Medicine

## 2021-02-04 ENCOUNTER — Encounter: Payer: Federal, State, Local not specified - PPO | Attending: General Surgery | Admitting: Skilled Nursing Facility1

## 2021-02-04 ENCOUNTER — Ambulatory Visit: Payer: Federal, State, Local not specified - PPO | Admitting: Skilled Nursing Facility1

## 2021-02-04 NOTE — Progress Notes (Signed)
Bariatric Nutrition Follow-Up Visit Medical Nutrition Therapy   NUTRITION ASSESSMENT   Sx date: 09/18/2020  Anthropometrics  Start weight at [facility]: 318.5 lbs (date: 05/07/2020) Weight today: virtual appt: pt identified via name and DOB; pt agreeable to limitations of this visit type  Clinical  Medical hx: Morbid Obesity, Anemia, COPD, Hyperlipidemia, Hypertension, GERD, depression/anxiety Medications: no longer taking lisinopril, taking protonix daily Labs:  Notable signs/symptoms: stated limited mobility  Any previous deficiencies? No   Lifestyle & Dietary Hx  Pt states she is not dealing with any GI issues.   Pt state she can now stand for long periods of time. Pt states she is taking more care of herself rather than relying on her husband.  Pt state she tries not to be too restrictive with her foods because she knows that would be triggering.    Estimated daily fluid intake: 64 Estimated daily protein intake: 60+ Supplements: inconsistent with remembering   Current average weekly physical activity: ADL's with less pain not gotten into extra movement yet  24-Hr Dietary Recall: one protein shake per day First Meal: eggs + bacon Snack:  protein shake Second Meal 9-10 am: steak or chicken or salad Snack: nuts Third Meal: steak or chicken or beans Snack: sugar free jello or sugar free pudding or sugar free popccicle  Beverages: water, unsweet tea, water + flavorings, gatorade zero, coffee + creamer  Post-Op Goals/ Signs/ Symptoms Using straws: no Drinking while eating: no Chewing/swallowing difficulties: no Changes in vision: no Changes to mood/headaches: no Hair loss/changes to skin/nails: no Difficulty focusing/concentrating: no Sweating: no Dizziness/lightheadedness: no Palpitations: no  Carbonated/caffeinated beverages: no N/V/D/C/Gas: no Abdominal pain: no Dumping syndrome: no    NUTRITION DIAGNOSIS  Overweight/obesity ([place]-3.3) related to past poor dietary  habits and physical inactivity as evidenced by completed bariatric surgery and following dietary guidelines for continued weight loss and healthy nutrition status.     NUTRITION INTERVENTION Nutrition counseling (C-1) and education (E-2) to facilitate bariatric surgery goals, including: The importance of consuming adequate calories as well as certain nutrients daily due to the body's need for essential vitamins, minerals, and fats The importance of daily physical activity and to reach a goal of at least 150 minutes of moderate to vigorous physical activity weekly (or as directed by their physician) due to benefits such as increased musculature and improved lab values The importance of intuitive eating specifically learning hunger-satiety cues and understanding the importance of learning a new body: The importance of mindful eating to avoid grazing behaviors    Handouts Provided Include  Phase 5  Learning Style & Readiness for Change Teaching method utilized: Visual & Auditory  Demonstrated degree of understanding via: Teach Back   RD's Notes for Next Visit Assess adherence to pt chosen goals   MONITORING & EVALUATION Dietary intake, weekly physical activity, body weight, tolerance  Next Steps Patient is to follow-up 3-4 months

## 2021-02-20 ENCOUNTER — Other Ambulatory Visit: Payer: Self-pay | Admitting: Internal Medicine

## 2021-03-01 ENCOUNTER — Ambulatory Visit
Admission: EM | Admit: 2021-03-01 | Discharge: 2021-03-01 | Disposition: A | Discharge status: Home / Self Care | Payer: Federal, State, Local not specified - PPO | Attending: Internal Medicine | Admitting: Internal Medicine

## 2021-03-01 ENCOUNTER — Other Ambulatory Visit: Payer: Self-pay

## 2021-03-01 DIAGNOSIS — R197 Diarrhea, unspecified: Secondary | ICD-10-CM | POA: Diagnosis not present

## 2021-03-01 DIAGNOSIS — E860 Dehydration: Secondary | ICD-10-CM | POA: Diagnosis not present

## 2021-03-01 DIAGNOSIS — Z1152 Encounter for screening for COVID-19: Secondary | ICD-10-CM

## 2021-03-01 NOTE — Discharge Instructions (Addendum)
I am reassured today by her exam.  I am concerned you may have COVID given recent close exposure.  PCR testing has been sent and should result on your MyChart in 2 to 3 days.  In the meantime, rest, push fluids as discussed.  Liquid IV is a good option for electrolyte replacement.  Should you develop any worsening symptoms or worsening signs of dehydration as discussed you will need to go directly to the emergency department.  If COVID test results are positive please call PCP immediately to determine eligibility for any antiviral medications if interested.

## 2021-03-01 NOTE — ED Triage Notes (Signed)
Pt c/o fever and n/v/d for a week.  Patient states both of her parents have covid, her at home test was negative last night.

## 2021-03-01 NOTE — ED Provider Notes (Signed)
[facility]    CSN: [id number] Arrival date & time: 03/01/21  1343      History   Chief Complaint Chief Complaint  Patient presents with   Fever   Diarrhea    HPI Christine Ramirez is a 56 y.o. female with history of gastric bypass surgery approximately 5 months ago presents urgent care today with 1 week history of fever and diarrhea.  Patient reports loose stool up to 5 times a day, nausea, fever.  Recent home COVID test negative however both parents + COVID and she had recent close contact.  Patient reports abdominal cramping and discomfort, generalized weakness.  She denies any headache, dizziness, chest pain, SOB, abdominal pain, vomiting.   Past Medical History:  Diagnosis Date   Anemia    Anxiety    Arthritis    osteo knees   COPD (chronic obstructive pulmonary disease) (HCC)    chronic bronchitis   Depression    Fatty liver    GERD (gastroesophageal reflux disease)    H/O typhoid fever    age 89   Headache    Heart murmur    as a child   Hepatitis    age 89   Hyperlipidemia    Hypertension    Jaundice    age 89   Pneumonia    in past   PONV (postoperative nausea and vomiting)    Scarlet fever    age 89   Shortness of breath dyspnea     Patient Active Problem List   Diagnosis Date Noted   Dehydration 10/18/2020   LUQ pain 09/28/2020   OSA (obstructive sleep apnea) - mild 09/18/2020   Prediabetes 09/18/2020   Lumbar radiculopathy 09/18/2020   Mixed hyperlipidemia 05/14/2020   Heart murmur 05/14/2020   COVID 03/09/2020   Routine general medical examination at a health care facility 07/10/2017   Chronic diarrhea 02/28/2016   Osteoarthritis 09/11/2015   Morbid obesity (HCC) 12/15/2014   Essential hypertension 12/15/2014   Gastro-esophageal reflux disease without esophagitis 07/03/2011    Past Surgical History:  Procedure Laterality Date   ABDOMINAL EXPLORATION SURGERY  1975   age 89 [facility]   APPENDECTOMY      CHOLECYSTECTOMY N/A 03/19/2015   Procedure: LAPAROSCOPIC CHOLECYSTECTOMY WITH INTRAOPERATIVE CHOLANGIOGRAM;  Surgeon: Gaynelle AduEric Wilson, MD;  Location: [facility];  Service: General;  Laterality: N/A;   CHOLECYSTECTOMY     COLONOSCOPY WITH PROPOFOL N/A 07/24/2016   Procedure: COLONOSCOPY WITH PROPOFOL;  Surgeon: Charlott RakesSchooler, Vincent, MD;  Location: [facility];  Service: Endoscopy;  Laterality: N/A;   DILATION AND CURETTAGE OF UTERUS  2000   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   Procedure: LAPAROSCOPIC ROUX-EN-Y GASTRIC BYPASS WITH UPPER ENDOSCOPY;  Surgeon: Gaynelle AduWilson, Eric, MD;  Location: [facility];  Service: General;  Laterality: N/A;   KNEE SURGERY Right     OB History   No obstetric history on file.      Home Medications    Prior to Admission medications   Medication Sig Start Date End Date Taking? Authorizing Provider  acetaminophen (TYLENOL) 500 MG tablet Take 1,000 mg by mouth every 6 (six) hours as needed for moderate pain.    [provider]  Calcium Carbonate 500 MG CHEW Chew 1 tablet by mouth in the morning, at noon, and at bedtime.    [provider]  DULoxetine (CYMBALTA) 60 MG capsule TAKE 1 CAPSULE BY MOUTH EVERY DAY Patient taking differently: Take 60 mg by mouth at bedtime. 01/18/20  Myrlene Broker, MD  enoxaparin (LOVENOX) 40 MG/0.4ML injection Inject 0.4 mLs (40 mg total) into the skin 2 (two) times daily for 14 days. 09/19/20 10/03/20  Gaynelle Adu, MD  gabapentin (NEURONTIN) 250 MG/5ML solution Take 4 mLs (200 mg total) by mouth 2 (two) times daily as needed for up to 5 days. 10/02/20 10/07/20  Gaynelle Adu, MD  hyoscyamine (LEVSIN SL) 0.125 MG SL tablet Place 1 tablet (0.125 mg total) under the tongue every 6 (six) hours as needed for cramping. 10/02/20   Gaynelle Adu, MD  lisinopril (ZESTRIL) 5 MG tablet Take 1 tablet (5 mg total) by mouth daily. Needs appointment before next refill Patient taking differently: Take 5 mg by mouth at bedtime. 01/11/20   Myrlene Broker, MD  Multiple Vitamins-Minerals (MULTIVITAMIN ADULT) CHEW Chew 1 tablet by mouth every morning.    [provider]  ondansetron (ZOFRAN-ODT) 4 MG disintegrating tablet Take 1 tablet (4 mg total) by mouth every 6 (six) hours as needed for nausea or vomiting. 10/02/20   Gaynelle Adu, MD  pantoprazole (PROTONIX) 40 MG tablet Take 1 tablet (40 mg total) by mouth 2 (two) times daily. 10/02/20 11/01/20  Gaynelle Adu, MD  polyethylene glycol (MIRALAX / GLYCOLAX) 17 g packet Take 17 g by mouth daily as needed. 10/02/20   Gaynelle Adu, MD  sucralfate (CARAFATE) 1 GM/10ML suspension Take 10 mLs (1 g total) by mouth 4 (four) times daily -  with meals and at bedtime. 10/02/20   Gaynelle Adu, MD  traMADol (ULTRAM) 50 MG tablet Take 1 tablet (50 mg total) by mouth every 6 (six) hours as needed (pain). 09/19/20   Gaynelle Adu, MD    Family History Family History  Problem Relation Age of Onset   Cancer Mother    Melanoma Mother    Arthritis Father    Cancer Maternal Grandmother    Heart disease Maternal Grandmother    Cancer Maternal Grandfather    Heart disease Maternal Grandfather    Depression Paternal Grandmother    Alcohol abuse Paternal Grandfather     Social History Social History   Tobacco Use   Smoking status: Never   Smokeless tobacco: Never  Vaping Use   Vaping Use: Never used  Substance Use Topics   Alcohol use: Not Currently    Comment: rarely   Drug use: No     Allergies   Patient has no known allergies.   Review of Systems As stated in HPI otherwise negative   Physical Exam Triage Vital Signs ED Triage Vitals  Enc Vitals Group     BP 03/01/21 1446 (!) 145/82     Pulse Rate 03/01/21 1446 76     Resp 03/01/21 1446 20     Temp 03/01/21 1446 98.2 F (36.8 C)     Temp Source 03/01/21 1439 Oral     SpO2 03/01/21 1446 96 %     Weight --      Height --      Head Circumference --      Peak Flow --      Pain Score 03/01/21 1444 0     Pain Loc --      Pain  Edu? --      Excl. in GC? --    No data found.  Updated Vital Signs BP (!) 145/82 (BP Location: Right Arm)    Pulse 76    Temp 98.2 F (36.8 C) (Oral)    Resp 20    SpO2  96%   Visual Acuity Right Eye Distance:   Left Eye Distance:   Bilateral Distance:    Right Eye Near:   Left Eye Near:    Bilateral Near:     Physical Exam   UC Treatments / Results  Labs (all labs ordered are listed, but only abnormal results are displayed) Labs Reviewed  NOVEL CORONAVIRUS, NAA   Narrative:    Performed at:  [address] [place], [place]  [id number] Lab Director: Jolene Schimke MD, Phone:  [phone]  SARS-COV-2, NAA 2 DAY TAT   Narrative:    Performed at:  [address] [place] [address] [place], [place]  [id number] Lab Director: Jolene Schimke MD, Phone:  [phone]    EKG   Radiology No results found.  Procedures Procedures (including critical care time)  Medications Ordered in UC Medications - No data to display  Initial Impression / Assessment and Plan / UC Course  I have reviewed the triage vital signs and the nursing notes.  Pertinent labs & imaging results that were available during my care of the patient were reviewed by me and considered in my medical decision making (see chart for details).  Diarrhea Clinical dehydration, mild -Symptoms ongoing x5 days.  Patient NT appearing, VSS -Suspect acute symptoms related to viral illness and exacerbated by chronic diarrhea with recent gastric bypass -COVID PCR testing given recent exposure -Push fluids including Pedialyte or liquid IV for electrolyte replacement -Zofran as previously prescribed for nausea -Discussed ER for hydration if she is unable to hydrate at home  Reviewed expections re: course of current medical issues. Questions answered. Outlined signs and symptoms indicating need for more acute intervention. Pt verbalized understanding. AVS given  Final Clinical  Impressions(s) / UC Diagnoses   Final diagnoses:  Diarrhea, unspecified type  Dehydration, mild  Encounter for screening for COVID-19     Discharge Instructions      I am reassured today by her exam.  I am concerned you may have COVID given recent close exposure.  PCR testing has been sent and should result on your MyChart in 2 to 3 days.  In the meantime, rest, push fluids as discussed.  Liquid IV is a good option for electrolyte replacement.  Should you develop any worsening symptoms or worsening signs of dehydration as discussed you will need to go directly to the emergency department.  If COVID test results are positive please call PCP immediately to determine eligibility for any antiviral medications if interested.     ED Prescriptions   None    PDMP not reviewed this encounter.   Rolla Etienne, NP 03/04/21 [phone]

## 2021-03-01 NOTE — ED Notes (Signed)
Called pt once no answer. 

## 2021-03-03 LAB — NOVEL CORONAVIRUS, NAA: SARS-CoV-2, NAA: NOT DETECTED

## 2021-03-03 LAB — SARS-COV-2, NAA 2 DAY TAT

## 2021-03-04 ENCOUNTER — Telehealth: Payer: Federal, State, Local not specified - PPO | Admitting: Internal Medicine

## 2021-03-04 ENCOUNTER — Other Ambulatory Visit: Payer: Self-pay

## 2021-03-05 ENCOUNTER — Telehealth: Payer: Self-pay

## 2021-03-05 ENCOUNTER — Encounter: Payer: Self-pay | Admitting: Internal Medicine

## 2021-03-05 MED ORDER — DULOXETINE HCL 60 MG PO CPEP: 60.0000 mg | ORAL_CAPSULE | Freq: Every day | ORAL | 0 refills | Status: DC

## 2021-03-05 NOTE — Telephone Encounter (Signed)
Refill has been sent to the pt's pharmacy  

## 2021-03-05 NOTE — Telephone Encounter (Signed)
Okay to fill for 1 month supply duloxetine 60 mg daily

## 2021-03-05 NOTE — Telephone Encounter (Signed)
Pt is calling requesting short fill on: DULoxetine (CYMBALTA) 60 MG capsule   Pt states that the dosage may need to be adjusted current wt 248.63 as of 03/05/21. Pt states that she needs this medication before her appt on 1/23.

## 2021-03-05 NOTE — Telephone Encounter (Signed)
See below

## 2021-03-14 DIAGNOSIS — M5416 Radiculopathy, lumbar region: Secondary | ICD-10-CM | POA: Diagnosis not present

## 2021-03-14 DIAGNOSIS — Z9884 Bariatric surgery status: Secondary | ICD-10-CM | POA: Diagnosis not present

## 2021-03-14 DIAGNOSIS — M25551 Pain in right hip: Secondary | ICD-10-CM | POA: Diagnosis not present

## 2021-03-18 ENCOUNTER — Other Ambulatory Visit: Payer: Self-pay

## 2021-03-18 ENCOUNTER — Encounter: Payer: Self-pay | Admitting: Internal Medicine

## 2021-03-18 ENCOUNTER — Ambulatory Visit (INDEPENDENT_AMBULATORY_CARE_PROVIDER_SITE_OTHER): Payer: Federal, State, Local not specified - PPO | Admitting: Internal Medicine

## 2021-03-18 VITALS — BP 122/74 | HR 71 | Resp 18 | Ht 61.0 in | Wt 251.2 lb

## 2021-03-18 DIAGNOSIS — M159 Polyosteoarthritis, unspecified: Secondary | ICD-10-CM | POA: Diagnosis not present

## 2021-03-18 DIAGNOSIS — Z0000 Encounter for general adult medical examination without abnormal findings: Secondary | ICD-10-CM | POA: Diagnosis not present

## 2021-03-18 DIAGNOSIS — K529 Noninfective gastroenteritis and colitis, unspecified: Secondary | ICD-10-CM

## 2021-03-18 DIAGNOSIS — R7303 Prediabetes: Secondary | ICD-10-CM

## 2021-03-18 DIAGNOSIS — I10 Essential (primary) hypertension: Secondary | ICD-10-CM

## 2021-03-18 MED ORDER — DULOXETINE HCL 30 MG PO CPEP: 30.0000 mg | ORAL_CAPSULE | Freq: Every day | ORAL | 3 refills | Status: DC

## 2021-03-18 NOTE — Progress Notes (Signed)
° °  Subjective:   Patient ID: Christine Ramirez, female    DOB: February 11, 1966, 56 y.o.   MRN: [id number]  HPI The patient is a 56 YO female coming in for physical. Gastric bypass July 2022 down close to 100 pounds.  PMH, [facility], social history reviewed and updated  Review of Systems  Constitutional: Negative.   HENT: Negative.    Eyes: Negative.   Respiratory:  Negative for cough, chest tightness and shortness of breath.   Cardiovascular:  Negative for chest pain, palpitations and leg swelling.  Gastrointestinal:  Positive for diarrhea. Negative for abdominal distention, abdominal pain, constipation, nausea and vomiting.  Musculoskeletal: Negative.   Skin: Negative.   Neurological: Negative.   Psychiatric/Behavioral: Negative.     Objective:  Physical Exam Constitutional:      Appearance: She is well-developed. She is obese.  HENT:     Head: Normocephalic and atraumatic.  Cardiovascular:     Rate and Rhythm: Normal rate and regular rhythm.  Pulmonary:     Effort: Pulmonary effort is normal. No respiratory distress.     Breath sounds: Normal breath sounds. No wheezing or rales.  Abdominal:     General: Bowel sounds are normal. There is no distension.     Palpations: Abdomen is soft.     Tenderness: There is no abdominal tenderness. There is no rebound.  Musculoskeletal:     Cervical back: Normal range of motion.  Skin:    General: Skin is warm and dry.  Neurological:     Mental Status: She is alert and oriented to person, place, and time.     Coordination: Coordination normal.    Vitals:   03/18/21 1506  BP: 122/74  Pulse: 71  Resp: 18  SpO2: 100%  Weight: 251 lb 3.2 oz (113.9 kg)  Height: 5\' 1"  (1.549 m)    This visit occurred during the SARS-CoV-2 public health emergency.  Safety protocols were in place, including screening questions prior to the visit, additional usage of staff PPE, and extensive cleaning of exam room while observing appropriate contact time as  indicated for disinfecting solutions.   Assessment & Plan:

## 2021-03-18 NOTE — Patient Instructions (Signed)
We will watch the labs results from the surgeon.

## 2021-03-19 NOTE — Assessment & Plan Note (Signed)
BP at goal off meds due to weight loss. Will monitor closely.

## 2021-03-19 NOTE — Assessment & Plan Note (Signed)
This is worse since gastric bypass and she is still learning and adapting to how different foods will impact her stomach. This is causing issues with her job and productivity and may need FMLA temporarily.

## 2021-03-19 NOTE — Assessment & Plan Note (Signed)
Weight down about 100 pounds since gastric bypass surgery and she is off most medications. Continue to monitor and likely will continue weight loss.

## 2021-03-19 NOTE — Assessment & Plan Note (Signed)
Getting HgA1c with labs for surgeon so will follow results. Likely resolved with 100 pound weight loss.

## 2021-03-19 NOTE — Assessment & Plan Note (Signed)
Flu shot declines. Covid-19 counseled. Shingrix declines today. Tetanus declines. Colonoscopy up to date. Mammogram up to date, pap smear up to date with gyn. Counseled about sun safety and mole surveillance. Counseled about the dangers of distracted driving. Given 10 year screening recommendations.

## 2021-03-19 NOTE — Assessment & Plan Note (Signed)
Marked improvement with weight loss 100 pounds.

## 2021-03-28 ENCOUNTER — Telehealth (INDEPENDENT_AMBULATORY_CARE_PROVIDER_SITE_OTHER): Payer: Federal, State, Local not specified - PPO | Admitting: Internal Medicine

## 2021-03-28 ENCOUNTER — Encounter: Payer: Self-pay | Admitting: Internal Medicine

## 2021-03-28 ENCOUNTER — Other Ambulatory Visit: Payer: Self-pay

## 2021-03-28 DIAGNOSIS — J069 Acute upper respiratory infection, unspecified: Secondary | ICD-10-CM | POA: Diagnosis not present

## 2021-03-28 MED ORDER — PREDNISONE 20 MG PO TABS: 40.0000 mg | ORAL_TABLET | Freq: Every day | ORAL | 0 refills | Status: DC

## 2021-03-28 MED ORDER — BENZONATATE 200 MG PO CAPS: 200.0000 mg | ORAL_CAPSULE | Freq: Three times a day (TID) | ORAL | 0 refills | Status: DC | PRN

## 2021-03-28 MED ORDER — ALBUTEROL SULFATE (2.5 MG/3ML) 0.083% IN NEBU: 2.5000 mg | INHALATION_SOLUTION | Freq: Four times a day (QID) | RESPIRATORY_TRACT | 1 refills | Status: DC | PRN

## 2021-03-28 NOTE — Progress Notes (Signed)
Virtual Visit via Video Note  I connected with Christine Ramirez on 03/28/21 at 10:20 AM EST by a video enabled telemedicine application and verified that I am speaking with the correct person using two identifiers.  The patient and the provider were at separate locations throughout the entire encounter. Patient location: home, Provider location: work   I discussed the limitations of evaluation and management by telemedicine and the availability of in person appointments. The patient expressed understanding and agreed to proceed. The patient and the provider were the only parties present for the visit unless noted in HPI below.  History of Present Illness: The patient is a 56 y.o. female with visit for cold symptoms.   Observations/Objective: Appearance: normal,coughing and wheezing, breathing appears normal no increased work of breathing, mental status is A and  O times 3  Assessment and Plan: See problem oriented charting  Follow Up Instructions: rx tessalon perles and prednisone 5 day course and rx albuterol nebulizer solution  I discussed the assessment and treatment plan with the patient. The patient was provided an opportunity to ask questions and all were answered. The patient agreed with the plan and demonstrated an understanding of the instructions.   The patient was advised to call back or seek an in-person evaluation if the symptoms worsen or if the condition fails to improve as anticipated.  Myrlene Broker, MD

## 2021-03-28 NOTE — Assessment & Plan Note (Addendum)
Likely viral URI rx prednisone, tessalon perles and albuterol solution for wheezing (she has nebulizer already). Work note done.

## 2021-04-17 ENCOUNTER — Telehealth: Payer: Self-pay

## 2021-04-17 NOTE — Telephone Encounter (Signed)
Pt has been made aware that her FMLA was faxed on 2.14.2023, she has requested that a copy be placed in the mail.

## 2021-04-17 NOTE — Telephone Encounter (Signed)
Pt is calling check the status of her FMLA paperwork to see if it was submitted.  Please contact pt with an update at [phone]

## 2021-04-23 NOTE — Telephone Encounter (Signed)
Pt calling in to check on FMLA pw due to her employer saying they hadn't received her FMLA pw and she hadn't received her copy in the mail.   It was confirmed the that the fax went out and also her copy was placed in the mail.   The nurse will refax a copy to [phone] [employer] employee and will also e-mail her a copy at [email or web address]  Pt was advised.

## 2021-04-24 ENCOUNTER — Encounter: Payer: Self-pay | Admitting: Internal Medicine

## 2021-07-09 NOTE — Progress Notes (Signed)
Virtual Visit via Video Note ? ?I connected with Christine Ramirez on 07/09/21 at  3:00 PM EDT by a video enabled telemedicine application and verified that I am speaking with the correct person using two identifiers. ?  ?I discussed the limitations of evaluation and management by telemedicine and the availability of in person appointments. The patient expressed understanding and agreed to proceed. ? ?Present for the visit:  Myself, Dr Cheryll Cockayne, [place].  The patient is currently at home and I am in the office.   ? ?No referring provider.  ? ? ?History of Present Illness: ?This is an acute visit for ?  Urine/kidney infection. ? ?She is not feeling well and feels like she may have an infection.  She is 10 months postop of bariatric surgery.  She has just lost 100 pounds since then. ? ?She does struggle with keeping up with fluids and does feel dehydrated right now.  She has had a low-grade fever, fatigue.  She has had some diarrhea.  She denies abdominal pain.  She has had some urinary urgency, dark urine and right-sided flank pain.  She does not think she has had increased frequency, but it is hard to say.  She has had some back pain.  She has also had headaches. ? ?She has had urinary tract infections in the past. ? ? ?Review of Systems  ?Constitutional:  Positive for fever (low grade) and malaise/fatigue.  ?Respiratory:  Negative for cough and shortness of breath.   ?Gastrointestinal:  Positive for diarrhea. Negative for abdominal pain.  ?Genitourinary:  Positive for flank pain (right) and urgency. Negative for dysuria, frequency and hematuria.  ?     Dark, cloudy urine  ?Musculoskeletal:  Positive for back pain.  ?Neurological:  Positive for headaches. Negative for dizziness.   ? ? ?Social History  ? ?Socioeconomic History  ? Marital status: Married  ?  Spouse name: Not on file  ? Number of children: Not on file  ? Years of education: Not on file  ? Highest education level: Not on file  ?Occupational  History  ? Not on file  ?Tobacco Use  ? Smoking status: Never  ? Smokeless tobacco: Never  ?Vaping Use  ? Vaping Use: Never used  ?Substance and Sexual Activity  ? Alcohol use: Not Currently  ?  Comment: rarely  ? Drug use: No  ? Sexual activity: Yes  ?  Birth control/protection: None  ?Other Topics Concern  ? Not on file  ?Social History Narrative  ? Not on file  ? ?Social Determinants of Health  ? ?Financial Resource Strain: Not on file  ?Food Insecurity: Not on file  ?Transportation Needs: Not on file  ?Physical Activity: Not on file  ?Stress: Not on file  ?Social Connections: Not on file  ? ?  ?Observations/Objective: ?Appears well in NAD ?Breathing normally ? ?Assessment and Plan: ? ?Acute cystitis: ?Discussed with her that it is very difficult to know for sure what is going on since this is a virtual visit, but I am concerned about her symptoms and I agree that she is likely dehydrated.  Stressed that she needs to push fluids ?Discussed that some of her symptoms are suggestive of a UTI and we can go ahead and treat empirically although it would feel better if we knew for sure that is what was going on ?Start Augmentin 875-125 mg twice daily x7 days ?Discussed that if she is not feeling better she needs to come in for evaluation  and she agrees ?Note given for work-to be off yesterday and today.  She will return tomorrow. ? ? ?Follow Up Instructions: ? ?  ?I discussed the assessment and treatment plan with the patient. The patient was provided an opportunity to ask questions and all were answered. The patient agreed with the plan and demonstrated an understanding of the instructions. ?  ?The patient was advised to call back or seek an in-person evaluation if the symptoms worsen or if the condition fails to improve as anticipated. ? ? ? ?Pincus Sanes, MD ? ?

## 2021-07-10 ENCOUNTER — Encounter: Payer: Self-pay | Admitting: Internal Medicine

## 2021-07-10 ENCOUNTER — Telehealth (INDEPENDENT_AMBULATORY_CARE_PROVIDER_SITE_OTHER): Payer: Federal, State, Local not specified - PPO | Admitting: Internal Medicine

## 2021-07-10 DIAGNOSIS — N3000 Acute cystitis without hematuria: Secondary | ICD-10-CM

## 2021-07-10 MED ORDER — AMOXICILLIN-POT CLAVULANATE 875-125 MG PO TABS: 1.0000 | ORAL_TABLET | Freq: Two times a day (BID) | ORAL | 0 refills | Status: AC

## 2021-07-24 ENCOUNTER — Other Ambulatory Visit: Payer: Self-pay | Admitting: Internal Medicine

## 2021-07-26 ENCOUNTER — Encounter: Payer: Self-pay | Admitting: Family Medicine

## 2021-07-26 ENCOUNTER — Telehealth (INDEPENDENT_AMBULATORY_CARE_PROVIDER_SITE_OTHER): Payer: Federal, State, Local not specified - PPO | Admitting: Family Medicine

## 2021-07-26 DIAGNOSIS — R197 Diarrhea, unspecified: Secondary | ICD-10-CM

## 2021-07-26 DIAGNOSIS — R5383 Other fatigue: Secondary | ICD-10-CM

## 2021-07-26 NOTE — Progress Notes (Signed)
MyChart Video Visit    Virtual Visit via Video Note   This visit type was conducted due to national recommendations for restrictions regarding the COVID-19 Pandemic (e.g. social distancing) in an effort to limit this patient's exposure and mitigate transmission in our community. This patient is at least at moderate risk for complications without adequate follow up. This format is felt to be most appropriate for this patient at this time. Physical exam was limited by quality of the video and audio technology used for the visit. CMA was able to get the patient set up on a video visit.  Patient location: Home. Patient and provider in visit Provider location: Office  I discussed the limitations of evaluation and management by telemedicine and the availability of in person appointments. The patient expressed understanding and agreed to proceed.  Visit Date: 07/26/2021  Today's healthcare provider: Hetty Blend, NP-C     Subjective:    Patient ID: Christine Ramirez, female    DOB: [birth year], 56 y.o.   MRN: [id number]  Chief Complaint  Patient presents with   Diarrhea    X2 days   Fatigue    HPI  Complains of a 2-3 day history of diarrhea and abdominal pain. Also has fatigue.   She took Imodium and the diarrhea stopped yesterday.   No fever, chills, dizziness, chest pain, palpitations, nausea or vomiting.  States she is not urinating a lot but her urine is not dark.  She felt more dehydrated but now is feeling some better.   She has not been able to work since getting sick. Needs a work note.   Hx of gastric bypass approximately one year ago.      Past Medical History:  Diagnosis Date   Anemia    Anxiety    Arthritis    osteo knees   COPD (chronic obstructive pulmonary disease) (HCC)    chronic bronchitis   Depression    Fatty liver    GERD (gastroesophageal reflux disease)    H/O typhoid fever    age 25   Headache    Heart murmur    as a child    Hepatitis    age 42   Hyperlipidemia    Hypertension    Jaundice    age 71   Pneumonia    in past   PONV (postoperative nausea and vomiting)    Scarlet fever    age 50   Shortness of breath dyspnea     Past Surgical History:  Procedure Laterality Date   ABDOMINAL EXPLORATION SURGERY  1975   age 2 at [facility]-open procedure   APPENDECTOMY     CHOLECYSTECTOMY N/A 03/19/2015   Procedure: LAPAROSCOPIC CHOLECYSTECTOMY WITH INTRAOPERATIVE CHOLANGIOGRAM;  Surgeon: Gaynelle Adu, MD;  Location: [facility]S;  Service: General;  Laterality: N/A;   CHOLECYSTECTOMY     COLONOSCOPY WITH PROPOFOL N/A 07/24/2016   Procedure: COLONOSCOPY WITH PROPOFOL;  Surgeon: Charlott Rakes, MD;  Location: [facility] ENDOSCOPY;  Service: Endoscopy;  Laterality: N/A;   DILATION AND CURETTAGE OF UTERUS  2000   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   Procedure: LAPAROSCOPIC ROUX-EN-Y GASTRIC BYPASS WITH UPPER ENDOSCOPY;  Surgeon: Gaynelle Adu, MD;  Location: [facility] ORS;  Service: General;  Laterality: N/A;   KNEE SURGERY Right     Family History  Problem Relation Age of Onset   Cancer Mother    Melanoma Mother    Arthritis Father    Cancer Maternal Grandmother    Heart disease Maternal Grandmother  Cancer Maternal Grandfather    Heart disease Maternal Grandfather    Depression Paternal Grandmother    Alcohol abuse Paternal Grandfather     Social History   Socioeconomic History   Marital status: Married    Spouse name: Not on file   Number of children: Not on file   Years of education: Not on file   Highest education level: Not on file  Occupational History   Not on file  Tobacco Use   Smoking status: Never   Smokeless tobacco: Never  Vaping Use   Vaping Use: Never used  Substance and Sexual Activity   Alcohol use: Not Currently    Comment: rarely   Drug use: No   Sexual activity: Yes    Birth control/protection: None  Other Topics Concern   Not on file  Social History Narrative   Not on file    Social Determinants of Health   Financial Resource Strain: Not on file  Food Insecurity: Not on file  Transportation Needs: Not on file  Physical Activity: Not on file  Stress: Not on file  Social Connections: Not on file  Intimate Partner Violence: Not on file    Outpatient Medications Prior to Visit  Medication Sig Dispense Refill   acetaminophen (TYLENOL) 500 MG tablet Take 1,000 mg by mouth every 6 (six) hours as needed for moderate pain.     albuterol (PROVENTIL) (2.5 MG/3ML) 0.083% nebulizer solution Take 3 mLs (2.5 mg total) by nebulization every 6 (six) hours as needed for wheezing or shortness of breath. 60 mL 1   DULoxetine (CYMBALTA) 30 MG capsule Take 1 capsule (30 mg total) by mouth daily. 90 capsule 3   ondansetron (ZOFRAN-ODT) 4 MG disintegrating tablet Take 1 tablet (4 mg total) by mouth every 6 (six) hours as needed for nausea or vomiting. 20 tablet 0   benzonatate (TESSALON) 200 MG capsule Take 1 capsule (200 mg total) by mouth 3 (three) times daily as needed. 60 capsule 0   predniSONE (DELTASONE) 20 MG tablet Take 2 tablets (40 mg total) by mouth daily with breakfast. 10 tablet 0   No facility-administered medications prior to visit.    No Known Allergies  ROS     Objective:    Physical Exam  There were no vitals taken for this visit. Wt Readings from Last 3 Encounters:  03/18/21 251 lb 3.2 oz (113.9 kg)  09/28/20 (!) 308 lb 10.3 oz (140 kg)  09/18/20 (!) 307 lb 4 oz (139.4 kg)   Alert and oriented in no acute distress.  Respirations unlabored.  She is speaking complete sentences without difficulty.     Assessment & Plan:   Problem List Items Addressed This Visit       Other   Fatigue   Other Visit Diagnoses     Diarrhea, unspecified type    -  Primary      Unclear etiology for diarrhea.  She is not in any acute distress.  No sign of an infectious process.  Symptoms are improving and diarrhea has resolved.  Counseling done on hydrating to  prevent her needing to go to the hospital for IV fluids.  I will provide her with an out of work note for May 31, June 1 and June 2.  I have discontinued Johann Capers. Anschutz's predniSONE and benzonatate. I am also having her maintain her acetaminophen, ondansetron, DULoxetine, and albuterol.  No orders of the defined types were placed in this encounter.   I discussed the  assessment and treatment plan with the patient. The patient was provided an opportunity to ask questions and all were answered. The patient agreed with the plan and demonstrated an understanding of the instructions.   The patient was advised to call back or seek an in-person evaluation if the symptoms worsen or if the condition fails to improve as anticipated.  I provided 19 minutes of face-to-face time during this encounter.   Hetty BlendVickie Sabena Winner, NP-C [facility] [place] [phone] (phone) [phone] (fax)  [facility]

## 2021-08-15 ENCOUNTER — Other Ambulatory Visit: Payer: Self-pay | Admitting: Obstetrics and Gynecology

## 2021-08-15 DIAGNOSIS — K760 Fatty (change of) liver, not elsewhere classified: Secondary | ICD-10-CM | POA: Diagnosis not present

## 2021-08-15 DIAGNOSIS — Z1231 Encounter for screening mammogram for malignant neoplasm of breast: Secondary | ICD-10-CM

## 2021-08-15 DIAGNOSIS — R7303 Prediabetes: Secondary | ICD-10-CM | POA: Diagnosis not present

## 2021-08-15 DIAGNOSIS — M170 Bilateral primary osteoarthritis of knee: Secondary | ICD-10-CM | POA: Diagnosis not present

## 2021-08-19 ENCOUNTER — Other Ambulatory Visit: Payer: Self-pay | Admitting: Internal Medicine

## 2021-08-19 ENCOUNTER — Ambulatory Visit: Payer: Federal, State, Local not specified - PPO

## 2021-08-23 ENCOUNTER — Other Ambulatory Visit: Payer: Self-pay | Admitting: Internal Medicine

## 2021-08-28 ENCOUNTER — Ambulatory Visit: Payer: Federal, State, Local not specified - PPO

## 2021-12-04 ENCOUNTER — Other Ambulatory Visit: Payer: Self-pay | Admitting: Internal Medicine

## 2021-12-05 NOTE — Telephone Encounter (Signed)
Please advise 

## 2021-12-06 ENCOUNTER — Telehealth: Payer: Self-pay | Admitting: Internal Medicine

## 2021-12-06 NOTE — Telephone Encounter (Signed)
Patient is requesting a refill on her duluxetine delayed release 30 mg tablets  Please send to [facility] [place] on [address]  Patient is coming in next week for an appointment

## 2021-12-06 NOTE — Telephone Encounter (Signed)
Please advise 

## 2021-12-06 NOTE — Telephone Encounter (Signed)
I'm not sure I understand. Last physical 02/2021 why would we not refill? Please refill without apt

## 2021-12-09 ENCOUNTER — Other Ambulatory Visit: Payer: Self-pay | Admitting: *Deleted

## 2021-12-09 MED ORDER — DULOXETINE HCL 30 MG PO CPEP: 30.0000 mg | ORAL_CAPSULE | Freq: Every day | ORAL | 3 refills | Status: DC

## 2021-12-09 NOTE — Telephone Encounter (Signed)
sent 

## 2021-12-11 ENCOUNTER — Encounter: Payer: Self-pay | Admitting: Internal Medicine

## 2021-12-11 ENCOUNTER — Ambulatory Visit: Payer: Federal, State, Local not specified - PPO | Admitting: Internal Medicine

## 2021-12-11 ENCOUNTER — Ambulatory Visit
Admission: RE | Admit: 2021-12-11 | Discharge: 2021-12-11 | Disposition: A | Discharge status: Home / Self Care | Payer: Federal, State, Local not specified - PPO | Source: Ambulatory Visit | Attending: Obstetrics and Gynecology | Admitting: Obstetrics and Gynecology

## 2021-12-11 DIAGNOSIS — R21 Rash and other nonspecific skin eruption: Secondary | ICD-10-CM | POA: Diagnosis not present

## 2021-12-11 DIAGNOSIS — M546 Pain in thoracic spine: Secondary | ICD-10-CM

## 2021-12-11 DIAGNOSIS — G8929 Other chronic pain: Secondary | ICD-10-CM | POA: Diagnosis not present

## 2021-12-11 DIAGNOSIS — Z1231 Encounter for screening mammogram for malignant neoplasm of breast: Secondary | ICD-10-CM | POA: Diagnosis not present

## 2021-12-11 MED ORDER — NYSTATIN-TRIAMCINOLONE 100000-0.1 UNIT/GM-% EX OINT: 1.0000 | TOPICAL_OINTMENT | Freq: Two times a day (BID) | CUTANEOUS | 3 refills | Status: DC

## 2021-12-11 MED ORDER — DULOXETINE HCL 60 MG PO CPEP: 60.0000 mg | ORAL_CAPSULE | Freq: Every day | ORAL | 3 refills | Status: DC

## 2021-12-11 NOTE — Patient Instructions (Signed)
We have sent in the cream to use twice a day. We have sent in the cymbalta to take 60 mg daily.

## 2021-12-11 NOTE — Progress Notes (Unsigned)
   Subjective:   Patient ID: Christine Ramirez, female    DOB: [birth year], 56 y.o.   MRN: [id number]  HPI The patient is a 56 YO female coming in for skin rash and cyst on stomach at scar incision.  Review of Systems  Constitutional: Negative.   HENT: Negative.    Eyes: Negative.   Respiratory:  Negative for cough, chest tightness and shortness of breath.   Cardiovascular:  Negative for chest pain, palpitations and leg swelling.  Gastrointestinal:  Negative for abdominal distention, abdominal pain, constipation, diarrhea, nausea and vomiting.  Musculoskeletal: Negative.   Skin:  Positive for rash.  Neurological: Negative.   Psychiatric/Behavioral: Negative.      Objective:  Physical Exam Constitutional:      Appearance: She is well-developed.  HENT:     Head: Normocephalic and atraumatic.  Cardiovascular:     Rate and Rhythm: Normal rate and regular rhythm.  Pulmonary:     Effort: Pulmonary effort is normal. No respiratory distress.     Breath sounds: Normal breath sounds. No wheezing or rales.  Abdominal:     General: Bowel sounds are normal. There is no distension.     Palpations: Abdomen is soft.     Tenderness: There is no abdominal tenderness. There is no rebound.  Musculoskeletal:     Cervical back: Normal range of motion.  Skin:    General: Skin is warm and dry.     Findings: Rash present.  Neurological:     Mental Status: She is alert and oriented to person, place, and time.     Coordination: Coordination normal.     Vitals:   12/11/21 1055  BP: 122/78  Pulse: 80  SpO2: 98%  Weight: 211 lb (95.7 kg)  Height: 5\' 1"  (1.549 m)    Assessment & Plan:

## 2021-12-12 ENCOUNTER — Encounter: Payer: Self-pay | Admitting: Internal Medicine

## 2021-12-12 DIAGNOSIS — R21 Rash and other nonspecific skin eruption: Secondary | ICD-10-CM | POA: Insufficient documentation

## 2021-12-12 DIAGNOSIS — M546 Pain in thoracic spine: Secondary | ICD-10-CM | POA: Insufficient documentation

## 2021-12-12 NOTE — Assessment & Plan Note (Signed)
Due to candida on skin and intertriginous. Rx nystatin/triamcinolone ointment. Hypertrophic mammaries are contributing to this rash as it is under the breasts bilaterally.

## 2021-12-12 NOTE — Assessment & Plan Note (Signed)
Due to hypertrophic mammary glands and is also causing pain in the shoulders due to need for adequate support. This is causing decrease in QOL.

## 2021-12-18 ENCOUNTER — Ambulatory Visit: Payer: Federal, State, Local not specified - PPO | Admitting: Internal Medicine

## 2022-04-03 ENCOUNTER — Encounter: Payer: Self-pay | Admitting: Internal Medicine

## 2022-04-03 ENCOUNTER — Ambulatory Visit: Payer: Federal, State, Local not specified - PPO | Attending: Internal Medicine | Admitting: Internal Medicine

## 2022-04-03 VITALS — BP 124/72 | HR 69 | Ht 61.0 in | Wt 216.2 lb

## 2022-04-03 DIAGNOSIS — E782 Mixed hyperlipidemia: Secondary | ICD-10-CM | POA: Diagnosis not present

## 2022-04-03 DIAGNOSIS — I10 Essential (primary) hypertension: Secondary | ICD-10-CM | POA: Diagnosis not present

## 2022-04-03 DIAGNOSIS — I700 Atherosclerosis of aorta: Secondary | ICD-10-CM | POA: Diagnosis not present

## 2022-04-03 LAB — LIPID PANEL
Chol/HDL Ratio: 3.4 ratio (ref 0.0–4.4)
Cholesterol, Total: 210 mg/dL — ABNORMAL HIGH (ref 100–199)
HDL: 61 mg/dL (ref >39)
LDL Chol Calc (NIH): 137 mg/dL — ABNORMAL HIGH (ref 0–99)
Triglycerides: 65 mg/dL (ref 0–149)
VLDL Cholesterol Cal: 12 mg/dL (ref 5–40)

## 2022-04-03 NOTE — Progress Notes (Signed)
Cardiology Office Note:    Date:  04/03/2022   ID:  Christine Ramirez, DOB Oct 27, 1965, MRN [id number]  PCP:  Christine Broker, MD   [facility]  Cardiologist:  Christine Constant, MD Advanced Practice Provider:  No care team member to display Electrophysiologist:  None      CC: Secondary prevention follow up  History of Present Illness:    Christine Ramirez is a 57 y.o. female with a hx of NAFLD, COPD, Morbid Obesity, HTN, HLD, who presents for evaluation 05/14/20. 2017 saw Dr. Elease Ramirez prior to potential surgery with bening work up. 2022: Saw me with benign work up.  Patient notes that she is doing great.   Since last visit notes she has lost ~ 120 pounds . There are no interval hospital/ED visit.   Still has rare chocolate binges that she relates to menopause. She is having hot flashes.  No chest pain or pressure .  No SOB/DOE and no PND/Orthopnea.  No weight gain or leg swelling.  No palpitations or syncope.   Past Medical History:  Diagnosis Date   Anemia    Anxiety    Arthritis    osteo knees   COPD (chronic obstructive pulmonary disease) (HCC)    chronic bronchitis   Depression    Fatty liver    GERD (gastroesophageal reflux disease)    H/O typhoid fever    age 30   Headache    Heart murmur    as a child   Hepatitis    age 14   Hyperlipidemia    Hypertension    Jaundice    age 10   Pneumonia    in past   PONV (postoperative nausea and vomiting)    Scarlet fever    age 23   Shortness of breath dyspnea     Past Surgical History:  Procedure Laterality Date   ABDOMINAL EXPLORATION SURGERY  1975   age 74 at [facility]-open procedure   APPENDECTOMY     CHOLECYSTECTOMY N/A 03/19/2015   Procedure: LAPAROSCOPIC CHOLECYSTECTOMY WITH INTRAOPERATIVE CHOLANGIOGRAM;  Surgeon: Christine Adu, MD;  Location: [facility];  Service: General;  Laterality: N/A;   CHOLECYSTECTOMY     COLONOSCOPY WITH PROPOFOL N/A 07/24/2016   Procedure:  COLONOSCOPY WITH PROPOFOL;  Surgeon: Christine Rakes, MD;  Location: [facility] ENDOSCOPY;  Service: Endoscopy;  Laterality: N/A;   DILATION AND CURETTAGE OF UTERUS  2000   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   Procedure: LAPAROSCOPIC ROUX-EN-Y GASTRIC BYPASS WITH UPPER ENDOSCOPY;  Surgeon: Christine Adu, MD;  Location: [facility];  Service: General;  Laterality: N/A;   KNEE SURGERY Right     Current Medications: Current Meds  Medication Sig   acetaminophen (TYLENOL) 500 MG tablet Take 1,000 mg by mouth every 6 (six) hours as needed for moderate pain.   albuterol (PROVENTIL) (2.5 MG/3ML) 0.083% nebulizer solution Take 3 mLs (2.5 mg total) by nebulization every 6 (six) hours as needed for wheezing or shortness of breath.   DULoxetine (CYMBALTA) 60 MG capsule Take 1 capsule (60 mg total) by mouth daily.   nystatin-triamcinolone ointment (MYCOLOG) Apply 1 Application topically 2 (two) times daily.   ondansetron (ZOFRAN-ODT) 4 MG disintegrating tablet Take 1 tablet (4 mg total) by mouth every 6 (six) hours as needed for nausea or vomiting.     Allergies:   Patient has no known allergies.   Social History   Socioeconomic History   Marital status: Married    Spouse name: Not  on file   Number of children: Not on file   Years of education: Not on file   Highest education level: Not on file  Occupational History   Not on file  Tobacco Use   Smoking status: Never   Smokeless tobacco: Never  Vaping Use   Vaping Use: Never used  Substance and Sexual Activity   Alcohol use: Not Currently    Comment: rarely   Drug use: No   Sexual activity: Yes    Birth control/protection: None  Other Topics Concern   Not on file  Social History Narrative   Not on file   Social Determinants of Health   Financial Resource Strain: Not on file  Food Insecurity: Not on file  Transportation Needs: Not on file  Physical Activity: Not on file  Stress: Not on file  Social Connections: Not on file    Social: Comes  with husband who is also my patient, former Mining engineer.  Family History: The patient's family history includes Alcohol abuse in her paternal grandfather; Arthritis in her father; Breast cancer (age of onset: 15) in her mother; Cancer in her maternal grandfather, maternal grandmother, and mother; Depression in her paternal grandmother; Heart disease in her maternal grandfather and maternal grandmother; Melanoma in her mother. History of coronary artery disease notable for grandfather. History of heart failure notable for CHF in [facility]. History of arrhythmia notable for no members. No history of bicuspid aortic valve or aortic aneurysm or dissection.    ROS:   Please see the history of present illness.    All other systems reviewed and are negative.  EKGs/Labs/Other Studies Reviewed:    The following studies were reviewed today:  EKG:   04/03/22: SR rate 69 05/14/20: SR 89 WNL  Cardiac Studies & Procedures       ECHOCARDIOGRAM  ECHOCARDIOGRAM COMPLETE 06/08/2020  Narrative ECHOCARDIOGRAM REPORT    Patient Name:   Christine Ramirez Date of Exam: 06/08/2020 Medical Rec #:  [id number]          Height:       61.0 in Accession #:    [id number]         Weight:       315.9 lb Date of Birth:  12/23/1965          BSA:          2.294 m Patient Age:    43 years           BP:           140/70 mmHg Patient Gender: F                  HR:           79 bpm. Exam Location:  [address]  Procedure: 2D Echo, Cardiac Doppler and Color Doppler  Indications:    R01.1 Murmur  History:        Patient has no prior history of Echocardiogram examinations. COPD, Signs/Symptoms:Murmur; Risk Factors:Morbid obesity, Hypertension and Dyslipidemia.  Sonographer:    Christine Ramirez RDCS Referring Phys: [id number] [facility] A Christine Ramirez   Sonographer Comments: Technically difficult study due to poor echo windows, patient is morbidly obese, suboptimal parasternal window and suboptimal subcostal  window. Image acquisition challenging due to patient body habitus and Image acquisition challenging due to COPD. IMPRESSIONS   1. Left ventricular ejection fraction, by estimation, is 55 to 60%. The left ventricle has normal function. The left ventricle has no regional wall motion abnormalities. There  is mild concentric left ventricular hypertrophy. Left ventricular diastolic parameters were normal. 2. Right ventricular systolic function is normal. The right ventricular size is normal. There is normal pulmonary artery systolic pressure. The estimated right ventricular systolic pressure is 16.0 mmHg. 3. The mitral valve is normal in structure. Trivial mitral valve regurgitation. No evidence of mitral stenosis. 4. The aortic valve was not well visualized. There is mild calcification of the aortic valve. There is mild thickening of the aortic valve. Aortic valve regurgitation is mild. Mild aortic valve sclerosis is present, with no evidence of aortic valve stenosis. 5. The inferior vena cava is normal in size with greater than 50% respiratory variability, suggesting right atrial pressure of 3 mmHg.  Comparison(s): No prior Echocardiogram.  FINDINGS Left Ventricle: Left ventricular ejection fraction, by estimation, is 55 to 60%. The left ventricle has normal function. The left ventricle has no regional wall motion abnormalities. The left ventricular internal cavity size was normal in size. There is mild concentric left ventricular hypertrophy. Left ventricular diastolic parameters were normal.  Right Ventricle: The right ventricular size is normal. No increase in right ventricular wall thickness. Right ventricular systolic function is normal. There is normal pulmonary artery systolic pressure. The tricuspid regurgitant velocity is 2.11 m/s, and with an assumed right atrial pressure of 3 mmHg, the estimated right ventricular systolic pressure is 73.7 mmHg.  Left Atrium: Left atrial size was normal  in size.  Right Atrium: Right atrial size was normal in size.  Pericardium: There is no evidence of pericardial effusion.  Mitral Valve: The mitral valve is normal in structure. Mild mitral annular calcification. Trivial mitral valve regurgitation. No evidence of mitral valve stenosis.  Tricuspid Valve: The tricuspid valve is normal in structure. Tricuspid valve regurgitation is trivial.  Aortic Valve: The aortic valve was not well visualized. There is mild calcification of the aortic valve. There is mild thickening of the aortic valve. Aortic valve regurgitation is mild. Aortic regurgitation PHT measures 360 msec. Mild aortic valve sclerosis is present, with no evidence of aortic valve stenosis.  Pulmonic Valve: The pulmonic valve was not well visualized. Pulmonic valve regurgitation is trivial.  Aorta: The aortic root and ascending aorta are structurally normal, with no evidence of dilitation.  Venous: The inferior vena cava is normal in size with greater than 50% respiratory variability, suggesting right atrial pressure of 3 mmHg.  IAS/Shunts: No atrial level shunt detected by color flow Doppler.   LEFT VENTRICLE PLAX 2D LVIDd:         4.50 cm  Diastology LVIDs:         3.10 cm  LV e' medial:    10.10 cm/s LV PW:         1.00 cm  LV E/e' medial:  10.7 LV IVS:        1.10 cm  LV e' lateral:   12.00 cm/s LVOT diam:     2.00 cm  LV E/e' lateral: 9.0 LV SV:         83 LV SV Index:   36 LVOT Area:     3.14 cm   RIGHT VENTRICLE             IVC RV S prime:     12.00 cm/s  IVC diam: 1.70 cm TAPSE (M-mode): 2.1 cm RVSP:           20.8 mmHg  LEFT ATRIUM             Index  RIGHT ATRIUM           Index LA diam:        3.50 cm 1.53 cm/m  RA Pressure: 3.00 mmHg LA Vol (A2C):   37.2 ml 16.22 ml/m RA Area:     11.30 cm LA Vol (A4C):   49.8 ml 21.71 ml/m RA Volume:   24.10 ml  10.51 ml/m LA Biplane Vol: 43.9 ml 19.14 ml/m AORTIC VALVE LVOT Vmax:   128.00 cm/s LVOT Vmean:   78.800 cm/s LVOT VTI:    0.265 m AI PHT:      360 msec  AORTA Ao Root diam: 3.00 cm Ao Asc diam:  3.20 cm  MV E velocity: 108.00 cm/s  TRICUSPID VALVE MV A velocity: 80.50 cm/s   TR Peak grad:   17.8 mmHg MV E/A ratio:  1.34         TR Vmax:        211.00 cm/s Estimated RAP:  3.00 mmHg RVSP:           20.8 mmHg  SHUNTS Systemic VTI:  0.26 m Systemic Diam: 2.00 cm  Gwyndolyn Kaufman MD Electronically signed by Gwyndolyn Kaufman MD Signature Date/Time: 06/08/2020/11:01:18 AM    Final             Recent Labs: No results found for requested labs within last 365 days.  Recent Lipid Panel    Component Value Date/Time   CHOL 209 (H) 01/18/2020 1614   TRIG 130.0 01/18/2020 1614   HDL 51.20 01/18/2020 1614   CHOLHDL 4 01/18/2020 1614   VLDL 26.0 01/18/2020 1614   LDLCALC 132 (H) 01/18/2020 1614    Physical Exam:    VS:  BP 124/72   Pulse 69   Ht 5\' 1"  (1.549 m)   Wt 216 lb 3.2 oz (98.1 kg)   SpO2 98%   BMI 40.85 kg/m     Wt Readings from Last 3 Encounters:  04/03/22 216 lb 3.2 oz (98.1 kg)  12/11/21 211 lb (95.7 kg)  03/18/21 251 lb 3.2 oz (113.9 kg)    Gen: no distress,  morbid obesity  Neck: No JVD,  Cardiac: No Rubs or Gallops, soft systolic murmur, RRR +2 radial pulses Respiratory: Clear to auscultation bilaterally, normal effort, normal  respiratory rate GI: Soft, nontender, non-distended  MS: No  edema;  moves all extremities Integument: Skin feels warm Neuro:  At time of evaluation, alert and oriented to person/place/time/situation  Psych: Normal affect, patient feels feel  ASSESSMENT:    1. Essential hypertension   2. Morbid obesity ([place])   3. Mixed hyperlipidemia   4. Aortic atherosclerosis (HCC)     PLAN:    HTN Morbid Obesity - BP at goal with weight loss - discussed exercise start given her former history of aerobics  - now controlled without medication  HLD Aortic atherosclerosis (09/2020 CT Abdomen Pelvis) - discussed diet around  hot flashes and chocolate, specifically dark chocolate - reviewed CT - discussed Pilates for Sleep and gave education - Fasting Lipids today  One year me or APP  Time Spent Directly with Patient:   I have spent a total of 40 minutes with the patient reviewing notes, imaging, EKGs, labs and examining the patient as well as establishing an assessment and plan that was discussed personally with the patient.  > 50% of time was spent in direct patient care and family.        Medication Adjustments/Labs and Tests Ordered: Current medicines are reviewed at  length with the patient today.  Concerns regarding medicines are outlined above.  No orders of the defined types were placed in this encounter.  No orders of the defined types were placed in this encounter.   Patient Instructions  Medication Instructions:  Your physician recommends that you continue on your current medications as directed. Please refer to the Current Medication list given to you today.  *If you need a refill on your cardiac medications before your next appointment, please call your pharmacy*   Lab Work: Lipids If you have labs (blood work) drawn today and your tests are completely normal, you will receive your results only by: [place] (if you have MyChart) OR A paper copy in the mail If you have any lab test that is abnormal or we need to change your treatment, we will call you to review the results.   Follow-Up: At [facility], you and your health needs are our priority.  As part of our continuing mission to provide you with exceptional heart care, we have created designated Provider Care Teams.  These Care Teams include your primary Cardiologist (physician) and Advanced Practice Providers (APPs -  Physician Assistants and Nurse Practitioners) who all work together to provide you with the care you need, when you need it.  Your next appointment:   1 year(s)  Provider:   Werner Lean,  MD       Signed, Werner Lean, MD  04/03/2022 9:26 AM    [place]

## 2022-04-03 NOTE — Patient Instructions (Signed)
Medication Instructions:  Your physician recommends that you continue on your current medications as directed. Please refer to the Current Medication list given to you today.  *If you need a refill on your cardiac medications before your next appointment, please call your pharmacy*   Lab Work: Lipids If you have labs (blood work) drawn today and your tests are completely normal, you will receive your results only by: [place] (if you have MyChart) OR A paper copy in the mail If you have any lab test that is abnormal or we need to change your treatment, we will call you to review the results.   Follow-Up: At [facility], you and your health needs are our priority.  As part of our continuing mission to provide you with exceptional heart care, we have created designated Provider Care Teams.  These Care Teams include your primary Cardiologist (physician) and Advanced Practice Providers (APPs -  Physician Assistants and Nurse Practitioners) who all work together to provide you with the care you need, when you need it.  Your next appointment:   1 year(s)  Provider:   Werner Lean, MD

## 2022-04-04 ENCOUNTER — Telehealth: Payer: Self-pay

## 2022-04-04 ENCOUNTER — Ambulatory Visit: Payer: Federal, State, Local not specified - PPO | Admitting: Internal Medicine

## 2022-04-04 DIAGNOSIS — E782 Mixed hyperlipidemia: Secondary | ICD-10-CM

## 2022-04-04 MED ORDER — ROSUVASTATIN CALCIUM 5 MG PO TABS: 5.0000 mg | ORAL_TABLET | Freq: Every day | ORAL | 3 refills | Status: DC

## 2022-04-04 NOTE — Telephone Encounter (Signed)
-----   Message from Werner Lean, MD sent at 04/04/2022  1:12 PM EST ----- Results: LDL above goal Plan: Off rosuvastatin 5 mg and f/u labs   Werner Lean, MD

## 2022-04-04 NOTE — Telephone Encounter (Signed)
The patient has been notified of the result and verbalized understanding.  All questions (if any) were answered. Bedelia Person Lourie Retz, RN 04/04/2022 3:22 PM   Pt to come in for f/u labs on 07/11/22.

## 2022-04-14 DIAGNOSIS — M1712 Unilateral primary osteoarthritis, left knee: Secondary | ICD-10-CM | POA: Diagnosis not present

## 2022-04-14 DIAGNOSIS — M25562 Pain in left knee: Secondary | ICD-10-CM | POA: Diagnosis not present

## 2022-05-02 DIAGNOSIS — M25562 Pain in left knee: Secondary | ICD-10-CM | POA: Diagnosis not present

## 2022-05-18 DIAGNOSIS — M25562 Pain in left knee: Secondary | ICD-10-CM | POA: Diagnosis not present

## 2022-05-23 ENCOUNTER — Telehealth: Payer: Self-pay

## 2022-05-23 DIAGNOSIS — M25562 Pain in left knee: Secondary | ICD-10-CM | POA: Diagnosis not present

## 2022-05-23 NOTE — Telephone Encounter (Signed)
   Name: BRIGETT VERDEROSA  DOB: [birth year]  MRN: [id number]   Primary Cardiologist: Werner Lean, MD  Chart reviewed as part of pre-operative protocol coverage. Patient was contacted 05/23/2022 in reference to pre-operative risk assessment for pending surgery as outlined below.  ALBERTO DONG was last seen on 04/03/2022 by Dr. Gasper Sells.  Since that day, RHIONNA GIFFIN has done well from a cardiac standpoint.  She denies any new cardiac symptoms or concerns.  Her activity is somewhat limited in the setting of knee pain, however, she is still able to complete greater than 4 METS without difficulty.  Therefore, based on ACC/AHA guidelines, the patient would be at acceptable risk for the planned procedure without further cardiovascular testing.   The patient was advised that if she develops new symptoms prior to surgery to contact our office to arrange for a follow-up visit, and she verbalized understanding.  I will route this recommendation to the requesting party via Epic fax function and remove from pre-op pool. Please call with questions.  Lenna Sciara, NP 05/23/2022, 11:04 AM

## 2022-05-23 NOTE — Telephone Encounter (Addendum)
   Pre-operative Risk Assessment    Patient Name: Christine Ramirez  DOB: 1965-08-03 MRN: [id number]      Request for Surgical Clearance    Procedure:   Left total knee replacement   Date of Surgery:  Clearance TBD                                 Surgeon:  Dr. Susa Day  Surgeon's Group or Practice Name:  [facility] Phone number:  [phone] Fax number:  [phone] Attn: Orson Slick   Type of Clearance Requested:   - Medical    Type of Anesthesia:  Not Indicated   Additional requests/questions:    Tana Conch   05/23/2022, 10:30 AM

## 2022-05-27 ENCOUNTER — Telehealth: Payer: Self-pay | Admitting: Internal Medicine

## 2022-05-27 NOTE — Telephone Encounter (Signed)
We have received Surgical Clearance PW in the fax, to be filled out by provider. Patient requested to send it via Fax within 7-days. Document is located in providers tray at front office.Please advise at [place] [phone] (mobile)   Please fax to:  [phone]

## 2022-07-11 ENCOUNTER — Ambulatory Visit: Payer: Federal, State, Local not specified - PPO

## 2022-07-22 ENCOUNTER — Ambulatory Visit: Payer: Federal, State, Local not specified - PPO | Attending: Internal Medicine

## 2022-12-17 ENCOUNTER — Other Ambulatory Visit: Payer: Self-pay | Admitting: Obstetrics and Gynecology

## 2022-12-17 DIAGNOSIS — Z1231 Encounter for screening mammogram for malignant neoplasm of breast: Secondary | ICD-10-CM

## 2022-12-22 ENCOUNTER — Telehealth: Payer: Self-pay | Admitting: Internal Medicine

## 2022-12-22 NOTE — Telephone Encounter (Signed)
No visit in more than 1 year ok for physical can address then

## 2022-12-22 NOTE — Telephone Encounter (Signed)
Pt sent a Mychart message wanting to get a hepatitis C screening do I need to set up and office visit for pt?

## 2022-12-25 ENCOUNTER — Ambulatory Visit
Admission: RE | Admit: 2022-12-25 | Discharge: 2022-12-25 | Disposition: A | Discharge status: Home / Self Care | Payer: Federal, State, Local not specified - PPO | Source: Ambulatory Visit | Attending: Obstetrics and Gynecology | Admitting: Obstetrics and Gynecology

## 2022-12-25 DIAGNOSIS — Z1231 Encounter for screening mammogram for malignant neoplasm of breast: Secondary | ICD-10-CM

## 2022-12-31 ENCOUNTER — Other Ambulatory Visit: Payer: Self-pay | Admitting: Internal Medicine

## 2023-01-02 ENCOUNTER — Telehealth (INDEPENDENT_AMBULATORY_CARE_PROVIDER_SITE_OTHER): Payer: Federal, State, Local not specified - PPO | Admitting: Internal Medicine

## 2023-01-02 ENCOUNTER — Encounter: Payer: Self-pay | Admitting: Internal Medicine

## 2023-01-02 DIAGNOSIS — I10 Essential (primary) hypertension: Secondary | ICD-10-CM | POA: Diagnosis not present

## 2023-01-02 DIAGNOSIS — R7303 Prediabetes: Secondary | ICD-10-CM | POA: Diagnosis not present

## 2023-01-02 DIAGNOSIS — F331 Major depressive disorder, recurrent, moderate: Secondary | ICD-10-CM

## 2023-01-02 MED ORDER — DULOXETINE HCL 60 MG PO CPEP: 120.0000 mg | ORAL_CAPSULE | Freq: Every day | ORAL | 3 refills | Status: DC

## 2023-01-02 NOTE — Assessment & Plan Note (Signed)
Increase cymbalta to 120 mg daily to see if this helps and try employee counseling through work to help also. Can fill out FMLA if needed.

## 2023-01-02 NOTE — Assessment & Plan Note (Signed)
Checking HgA1c and vitamin D and B12 and ferritin. S/P bariatric surgery. Current weight around 217 lb

## 2023-01-02 NOTE — Assessment & Plan Note (Signed)
Checking CMP and adjust as needed. Off medication and she has not checked recently.

## 2023-01-02 NOTE — Assessment & Plan Note (Signed)
Checking HgA1c and adjust as needed.  

## 2023-01-02 NOTE — Progress Notes (Signed)
Virtual Visit via Video Note  I connected with [name] on 01/02/23 at  4:00 PM EST by a video enabled telemedicine application and verified that I am speaking with the correct person using two identifiers.  The patient and the provider were at separate locations throughout the entire encounter. Patient location: normal, Provider location: work   I discussed the limitations of evaluation and management by telemedicine and the availability of in person appointments. The patient expressed understanding and agreed to proceed. The patient and the provider were the only parties present for the visit unless noted in HPI below.  History of Present Illness: The patient is a 57 y.o. female with visit for having more stress and anxiety. Crying inappropriately and more stress. Started with mom's illness May.  Observations/Objective: Appearance: normal, breathing appears normal, casual grooming, mental status is A and O times 3  Assessment and Plan: See problem oriented charting  Follow Up Instructions: increase cymbalta to 120 mg daily  I discussed the assessment and treatment plan with the patient. The patient was provided an opportunity to ask questions and all were answered. The patient agreed with the plan and demonstrated an understanding of the instructions.   The patient was advised to call back or seek an in-person evaluation if the symptoms worsen or if the condition fails to improve as anticipated.  Myrlene Broker, MD

## 2023-01-05 DIAGNOSIS — M170 Bilateral primary osteoarthritis of knee: Secondary | ICD-10-CM | POA: Diagnosis not present

## 2023-01-14 DIAGNOSIS — K080 Exfoliation of teeth due to systemic causes: Secondary | ICD-10-CM | POA: Diagnosis not present

## 2023-01-15 ENCOUNTER — Encounter: Payer: Self-pay | Admitting: Plastic Surgery

## 2023-01-15 ENCOUNTER — Ambulatory Visit: Payer: Federal, State, Local not specified - PPO | Admitting: Plastic Surgery

## 2023-01-15 VITALS — BP 133/77 | HR 69 | Ht 60.0 in | Wt 223.4 lb

## 2023-01-15 DIAGNOSIS — M546 Pain in thoracic spine: Secondary | ICD-10-CM

## 2023-01-15 DIAGNOSIS — Z6841 Body Mass Index (BMI) 40.0 and over, adult: Secondary | ICD-10-CM

## 2023-01-15 DIAGNOSIS — N62 Hypertrophy of breast: Secondary | ICD-10-CM | POA: Diagnosis not present

## 2023-01-15 DIAGNOSIS — M542 Cervicalgia: Secondary | ICD-10-CM | POA: Diagnosis not present

## 2023-01-15 DIAGNOSIS — Z9884 Bariatric surgery status: Secondary | ICD-10-CM

## 2023-01-15 DIAGNOSIS — M793 Panniculitis, unspecified: Secondary | ICD-10-CM | POA: Diagnosis not present

## 2023-01-15 DIAGNOSIS — R21 Rash and other nonspecific skin eruption: Secondary | ICD-10-CM

## 2023-01-15 NOTE — Progress Notes (Signed)
Referring Provider Myrlene Broker, MD [address] [place],  [place] [address]   CC:  Chief Complaint  Patient presents with   Consult           Christine Ramirez is an 57 y.o. female.  HPI: Christine Ramirez is a 57 year old female who presents today for evaluation for breast reduction and panniculectomy.  She underwent bariatric surgery in 2022 and is lost approximately 127 pounds though she has gained some of that weight back.  She notes upper back and neck pain which she feels is due to the large size of her breast and she has a very large pannus which extends down to her thighs and she has ongoing rashes on the posterior aspect.  She is interested in surgical reduction of both of these areas.  Of note the patient also has left knee pain and is in the process of being evaluated for a knee replacement.  States that the knee pain limits her ability to exercise.  No Known Allergies  Outpatient Encounter Medications as of 01/15/2023  Medication Sig   acetaminophen (TYLENOL) 500 MG tablet Take 1,000 mg by mouth every 6 (six) hours as needed for moderate pain.   albuterol (PROVENTIL) (2.5 MG/3ML) 0.083% nebulizer solution Take 3 mLs (2.5 mg total) by nebulization every 6 (six) hours as needed for wheezing or shortness of breath.   DULoxetine (CYMBALTA) 60 MG capsule Take 2 capsules (120 mg total) by mouth daily.   nystatin-triamcinolone ointment (MYCOLOG) Apply 1 Application topically 2 (two) times daily.   ondansetron (ZOFRAN-ODT) 4 MG disintegrating tablet Take 1 tablet (4 mg total) by mouth every 6 (six) hours as needed for nausea or vomiting.   rosuvastatin (CRESTOR) 5 MG tablet Take 1 tablet (5 mg total) by mouth daily.   No facility-administered encounter medications on file as of 01/15/2023.     Past Medical History:  Diagnosis Date   Anemia    Anxiety    Arthritis    osteo knees   COPD (chronic obstructive pulmonary disease) (HCC)    chronic bronchitis   Depression     Fatty liver    GERD (gastroesophageal reflux disease)    H/O typhoid fever    age 81   Headache    Heart murmur    as a child   Hepatitis    age 22   Hyperlipidemia    Hypertension    Jaundice    age 89   Pneumonia    in past   PONV (postoperative nausea and vomiting)    Scarlet fever    age 61   Shortness of breath dyspnea     Past Surgical History:  Procedure Laterality Date   ABDOMINAL EXPLORATION SURGERY  1975   age 65 at [facility]-open procedure   APPENDECTOMY     CHOLECYSTECTOMY N/A 03/19/2015   Procedure: LAPAROSCOPIC CHOLECYSTECTOMY WITH INTRAOPERATIVE CHOLANGIOGRAM;  Surgeon: Gaynelle Adu, MD;  Location: [facility];  Service: General;  Laterality: N/A;   CHOLECYSTECTOMY     COLONOSCOPY WITH PROPOFOL N/A 07/24/2016   Procedure: COLONOSCOPY WITH PROPOFOL;  Surgeon: Charlott Rakes, MD;  Location: [facility] ENDOSCOPY;  Service: Endoscopy;  Laterality: N/A;   DILATION AND CURETTAGE OF UTERUS  2000   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   Procedure: LAPAROSCOPIC ROUX-EN-Y GASTRIC BYPASS WITH UPPER ENDOSCOPY;  Surgeon: Gaynelle Adu, MD;  Location: [facility];  Service: General;  Laterality: N/A;   KNEE SURGERY Right     Family History  Problem Relation  Age of Onset   Breast cancer Mother 34   Cancer Mother    Melanoma Mother    Arthritis Father    Cancer Maternal Grandmother    Heart disease Maternal Grandmother    Cancer Maternal Grandfather    Heart disease Maternal Grandfather    Depression Paternal Grandmother    Alcohol abuse Paternal Grandfather     Social History   Social History Narrative   Not on file     Review of Systems General: Denies fevers, chills, weight loss CV: Denies chest pain, shortness of breath, palpitations Breast: Large size of her breast close upper back and neck pain.  She has no other specific breast complaints Abdomen: Large anterior abdominal wall apron of fat which has rashes on the posterior aspect and also interferes with daily  activities.  Physical Exam    01/15/2023    2:53 PM 04/03/2022    8:44 AM 12/11/2021   10:55 AM  Vitals with BMI  Height 5\' 0"  5\' 1"  5\' 1"   Weight 223 lbs 6 oz 216 lbs 3 oz 211 lbs  BMI 43.63 40.87 39.89  Systolic 133 124 542  Diastolic 77 72 78  Pulse 69 69 80    General:  No acute distress,  Alert and oriented, Non-Toxic, Normal speech and affect Breast: Patient has moderately large breasts with grade 3 ptosis. Pannus: Large pannus which extends to the mid thighs. Mammogram: October 2024 BI-RADS 1 Assessment/Plan Patient has undergone bariatric surgery and has a large amount of excess skin however she has a significant amount of fat as well.  We discussed both breast reduction and panniculectomy in general terms.  Patient currently has a BMI of 43 and I have asked her to lose 25 pounds before we consider surgery.  She feels that she would like to lose significantly more weight then this but is not sure that she is able to.  She is still trying to decide whether she would like to proceed with the knee replacement first or have the pannus removed.  We discussed strategies which may be helpful with weight loss.  These include walking in the pool which will help relieve some of the stress on her knees as well as provide resistance which may increase calorie utilization.  We talked about use of stationary bike.  We also talked about weight lifting which would have the added benefit of strengthening bones and decreasing risk of osteoporosis as well as providing additional strength.  She is going to work on weight loss and then will let me know how she would like to proceed with a breast reduction and with a panniculectomy.  I will not schedule either of these procedures at this time as weight loss will certainly change the amount that I can remove at the time of breast reduction.  Follow-up with me after the beginning of the year.  Santiago Glad 01/15/2023, 5:06 PM

## 2023-02-17 ENCOUNTER — Ambulatory Visit (INDEPENDENT_AMBULATORY_CARE_PROVIDER_SITE_OTHER): Payer: Federal, State, Local not specified - PPO

## 2023-02-17 ENCOUNTER — Encounter: Payer: Self-pay | Admitting: Internal Medicine

## 2023-02-17 ENCOUNTER — Ambulatory Visit: Payer: Federal, State, Local not specified - PPO | Admitting: Internal Medicine

## 2023-02-17 DIAGNOSIS — J22 Unspecified acute lower respiratory infection: Secondary | ICD-10-CM | POA: Insufficient documentation

## 2023-02-17 DIAGNOSIS — Z114 Encounter for screening for human immunodeficiency virus [HIV]: Secondary | ICD-10-CM | POA: Insufficient documentation

## 2023-02-17 DIAGNOSIS — R052 Subacute cough: Secondary | ICD-10-CM

## 2023-02-17 DIAGNOSIS — Z1159 Encounter for screening for other viral diseases: Secondary | ICD-10-CM | POA: Diagnosis not present

## 2023-02-17 DIAGNOSIS — I10 Essential (primary) hypertension: Secondary | ICD-10-CM | POA: Insufficient documentation

## 2023-02-17 DIAGNOSIS — R0989 Other specified symptoms and signs involving the circulatory and respiratory systems: Secondary | ICD-10-CM | POA: Diagnosis not present

## 2023-02-17 DIAGNOSIS — R011 Cardiac murmur, unspecified: Secondary | ICD-10-CM

## 2023-02-17 DIAGNOSIS — R059 Cough, unspecified: Secondary | ICD-10-CM | POA: Diagnosis not present

## 2023-02-17 LAB — URINALYSIS, ROUTINE W REFLEX MICROSCOPIC
Hgb urine dipstick: NEGATIVE
Ketones, ur: NEGATIVE
Leukocytes,Ua: NEGATIVE
Nitrite: NEGATIVE
Specific Gravity, Urine: >=1.03 — AB (ref 1.000–1.030)
Total Protein, Urine: NEGATIVE
Urine Glucose: NEGATIVE
Urobilinogen, UA: 0.2 (ref 0.0–1.0)
pH: 6 (ref 5.0–8.0)

## 2023-02-17 LAB — HEPATIC FUNCTION PANEL
ALT: 22 U/L (ref 0–35)
AST: 22 U/L (ref 0–37)
Albumin: 4 g/dL (ref 3.5–5.2)
Alkaline Phosphatase: 90 U/L (ref 39–117)
Bilirubin, Direct: 0.2 mg/dL (ref 0.0–0.3)
Total Bilirubin: 0.5 mg/dL (ref 0.2–1.2)
Total Protein: 6.8 g/dL (ref 6.0–8.3)

## 2023-02-17 LAB — CBC WITH DIFFERENTIAL/PLATELET
Basophils Absolute: 0 10*3/uL (ref 0.0–0.1)
Basophils Relative: 0.4 % (ref 0.0–3.0)
Eosinophils Absolute: 0.1 10*3/uL (ref 0.0–0.7)
Eosinophils Relative: 1.8 % (ref 0.0–5.0)
HCT: 39.1 % (ref 36.0–46.0)
Hemoglobin: 12.6 g/dL (ref 12.0–15.0)
Lymphocytes Relative: 23.7 % (ref 12.0–46.0)
Lymphs Abs: 1.6 10*3/uL (ref 0.7–4.0)
MCHC: 32.1 g/dL (ref 30.0–36.0)
MCV: 92.6 fL (ref 78.0–100.0)
Monocytes Absolute: 0.5 10*3/uL (ref 0.1–1.0)
Monocytes Relative: 7 % (ref 3.0–12.0)
Neutro Abs: 4.7 10*3/uL (ref 1.4–7.7)
Neutrophils Relative %: 67.1 % (ref 43.0–77.0)
Platelets: 248 10*3/uL (ref 150.0–400.0)
RBC: 4.23 Mil/uL (ref 3.87–5.11)
RDW: 14.2 % (ref 11.5–15.5)
WBC: 7 10*3/uL (ref 4.0–10.5)

## 2023-02-17 LAB — HEMOGLOBIN A1C: Hgb A1c MFr Bld: 5.5 % (ref 4.6–6.5)

## 2023-02-17 LAB — BASIC METABOLIC PANEL
BUN: 11 mg/dL (ref 6–23)
CO2: 28 meq/L (ref 19–32)
Calcium: 8.8 mg/dL (ref 8.4–10.5)
Chloride: 106 meq/L (ref 96–112)
Creatinine, Ser: 0.66 mg/dL (ref 0.40–1.20)
GFR: 96.8 mL/min (ref >60.00)
Glucose, Bld: 92 mg/dL (ref 70–99)
Potassium: 4 meq/L (ref 3.5–5.1)
Sodium: 141 meq/L (ref 135–145)

## 2023-02-17 LAB — TSH: TSH: 1.95 u[IU]/mL (ref 0.35–5.50)

## 2023-02-17 MED ORDER — OLMESARTAN MEDOXOMIL 20 MG PO TABS: 20.0000 mg | ORAL_TABLET | Freq: Every day | ORAL | 0 refills | Status: DC

## 2023-02-17 MED ORDER — CEFDINIR 300 MG PO CAPS: 300.0000 mg | ORAL_CAPSULE | Freq: Two times a day (BID) | ORAL | 0 refills | Status: AC

## 2023-02-17 MED ORDER — HYDROCODONE BIT-HOMATROP MBR 5-1.5 MG/5ML PO SOLN: 5.0000 mL | Freq: Three times a day (TID) | ORAL | 0 refills | Status: AC | PRN

## 2023-02-17 NOTE — Progress Notes (Unsigned)
Subjective:  Patient ID: Christine Ramirez, female    DOB: 1965-07-17  Age: 57 y.o. MRN: [id number]  CC: Cough and Hypertension   HPI Christine Ramirez presents for an acute visit -  Discussed the use of AI scribe software for clinical note transcription with the patient, who gave verbal consent to proceed.  History of Present Illness   The patient, with a history of heart murmur and gastric bypass surgery, presents with symptoms suggestive of an upper respiratory infection that began approximately a week ago. The initial symptoms were a sore and scratchy throat, which progressed to include cough and nasal congestion. The patient reports coughing up mucus that has varied in color from clear to yellowish and light green.  The patient also reports experiencing shortness of breath but denies wheezing and chest pain. She has had a low-grade fever intermittently, but attributes this to menopause-related hot flashes. The patient also reports fatigue and excessive sleep, which she attributes to the use of Nyquil for symptom management.  The patient's symptoms have caused her to miss work, and she expresses concern about returning to work while still symptomatic.       Outpatient Medications Prior to Visit  Medication Sig Dispense Refill   acetaminophen (TYLENOL) 500 MG tablet Take 1,000 mg by mouth every 6 (six) hours as needed for moderate pain.     albuterol (PROVENTIL) (2.5 MG/3ML) 0.083% nebulizer solution Take 3 mLs (2.5 mg total) by nebulization every 6 (six) hours as needed for wheezing or shortness of breath. 60 mL 1   DULoxetine (CYMBALTA) 60 MG capsule Take 2 capsules (120 mg total) by mouth daily. 180 capsule 3   nystatin-triamcinolone ointment (MYCOLOG) Apply 1 Application topically 2 (two) times daily. 100 g 3   ondansetron (ZOFRAN-ODT) 4 MG disintegrating tablet Take 1 tablet (4 mg total) by mouth every 6 (six) hours as needed for nausea or vomiting. 20 tablet 0   rosuvastatin  (CRESTOR) 5 MG tablet Take 1 tablet (5 mg total) by mouth daily. 90 tablet 3   No facility-administered medications prior to visit.    ROS Review of Systems  Constitutional:  Positive for fatigue, fever and unexpected weight change (wt gain). Negative for activity change, appetite change, chills and diaphoresis.  HENT:  Positive for postnasal drip, rhinorrhea and sore throat. Negative for sinus pressure and trouble swallowing.   Respiratory:  Positive for cough. Negative for shortness of breath and wheezing.   Cardiovascular:  Negative for chest pain, palpitations and leg swelling.  Gastrointestinal:  Negative for abdominal pain, constipation, diarrhea, nausea and vomiting.  Genitourinary: Negative.  Negative for difficulty urinating, dysuria, frequency, hematuria and urgency.  Musculoskeletal:  Positive for myalgias.  Skin: Negative.   Neurological:  Positive for weakness.  Hematological:  Negative for adenopathy. Does not bruise/bleed easily.  Psychiatric/Behavioral: Negative.      Objective:  BP (!) 142/90 (BP Location: Left Arm, Patient Position: Sitting, Cuff Size: Large)   Pulse 80   Temp 97.8 F (36.6 C) (Temporal)   Resp 16   Ht 5' (1.524 m)   Wt 226 lb (102.5 kg)   SpO2 100%   BMI 44.14 kg/m   BP Readings from Last 3 Encounters:  02/17/23 (!) 142/90  01/15/23 133/77  04/03/22 124/72    Wt Readings from Last 3 Encounters:  02/17/23 226 lb (102.5 kg)  01/15/23 223 lb 6.4 oz (101.3 kg)  04/03/22 216 lb 3.2 oz (98.1 kg)    Physical Exam Constitutional:  General: She is not in acute distress.    Appearance: She is obese. She is not ill-appearing, toxic-appearing or diaphoretic.  HENT:     Nose: Nose normal.     Mouth/Throat:     Mouth: Mucous membranes are moist.  Eyes:     General: No scleral icterus.    Conjunctiva/sclera: Conjunctivae normal.  Cardiovascular:     Rate and Rhythm: Normal rate and regular rhythm.     Heart sounds: Murmur heard.      Systolic murmur is present with a grade of 2/6.     No friction rub. No gallop.     Comments: 2/6 SEM Pulmonary:     Effort: Pulmonary effort is normal.     Breath sounds: No stridor. No wheezing, rhonchi or rales.  Abdominal:     General: Abdomen is protuberant. There is no distension.     Palpations: Abdomen is soft. There is no hepatomegaly, splenomegaly or mass.     Tenderness: There is no abdominal tenderness. There is no guarding.  Musculoskeletal:        General: Normal range of motion.     Cervical back: Neck supple.     Right lower leg: No edema.     Left lower leg: No edema.  Lymphadenopathy:     Cervical: No cervical adenopathy.  Skin:    General: Skin is dry.     Coloration: Skin is not pale.  Neurological:     General: No focal deficit present.     Mental Status: Mental status is at baseline.  Psychiatric:        Mood and Affect: Mood normal.        Behavior: Behavior normal.     Lab Results  Component Value Date   WBC 7.0 02/17/2023   HGB 12.6 02/17/2023   HCT 39.1 02/17/2023   PLT 248.0 02/17/2023   GLUCOSE 92 02/17/2023   CHOL 210 (H) 04/03/2022   TRIG 65 04/03/2022   HDL 61 04/03/2022   LDLCALC 137 (H) 04/03/2022   ALT 22 02/17/2023   AST 22 02/17/2023   NA 141 02/17/2023   K 4.0 02/17/2023   CL 106 02/17/2023   CREATININE 0.66 02/17/2023   BUN 11 02/17/2023   CO2 28 02/17/2023   TSH 1.95 02/17/2023   HGBA1C 5.5 02/17/2023    MM 3D SCREENING MAMMOGRAM BILATERAL BREAST Result Date: 12/31/2022 CLINICAL DATA:  Screening. EXAM: DIGITAL SCREENING BILATERAL MAMMOGRAM WITH TOMOSYNTHESIS AND CAD TECHNIQUE: Bilateral screening digital craniocaudal and mediolateral oblique mammograms were obtained. Bilateral screening digital breast tomosynthesis was performed. The images were evaluated with computer-aided detection. COMPARISON:  Previous exam(s). ACR Breast Density Category a: The breasts are almost entirely fatty. FINDINGS: There are no findings  suspicious for malignancy. IMPRESSION: No mammographic evidence of malignancy. A result letter of this screening mammogram will be mailed directly to the patient. RECOMMENDATION: Screening mammogram in one year. (Code:[id number]-01Y) BI-RADS CATEGORY  1: Negative. Electronically Signed   By: Amie Portland M.D.   On: 12/31/2022 09:41   DG Chest 2 View Result Date: 02/17/2023 CLINICAL DATA:  57 year old female with cough and congestion, fatigue for 1 week. EXAM: CHEST - 2 VIEW COMPARISON:  Chest radiographs 03/14/2015. FINDINGS: Improved lung volumes compared to 2017. Mediastinal contours remain normal. Visualized tracheal air column is within normal limits. No pneumothorax, pulmonary edema, pleural effusion or confluent lung opacity. Lung markings are within normal limits. No acute osseous abnormality identified. Negative visible bowel gas. IMPRESSION: No acute cardiopulmonary abnormality.  Electronically Signed   By: Odessa Fleming M.D.   On: 02/17/2023 09:10     Assessment & Plan:  Morbid obesity (HCC) -     Hepatic function panel; Future -     Hemoglobin A1c; Future -     TSH; Future  Primary hypertension -     Basic metabolic panel; Future -     CBC with Differential/Platelet; Future -     Hepatic function panel; Future -     TSH; Future -     Urinalysis, Routine w reflex microscopic; Future -     Olmesartan Medoxomil; Take 1 tablet (20 mg total) by mouth daily.  Dispense: 90 tablet; Refill: 0  Subacute cough -     DG Chest 2 View; Future  Encounter for screening for HIV -     HIV Antibody (routine testing w rflx); Future  Need for hepatitis C screening test -     Hepatitis C antibody; Future  LRTI (lower respiratory tract infection) -     Cefdinir; Take 1 capsule (300 mg total) by mouth 2 (two) times daily for 7 days.  Dispense: 14 capsule; Refill: 0 -     HYDROcodone Bit-Homatrop MBr; Take 5 mLs by mouth every 8 (eight) hours as needed for up to 8 days for cough.  Dispense: 120 mL; Refill:  0  Heart murmur -     ECHOCARDIOGRAM COMPLETE; Future     Follow-up: Return in about 3 months (around 05/18/2023).  Sanda Linger, MD

## 2023-02-17 NOTE — Patient Instructions (Signed)
Hypertension, Adult High blood pressure (hypertension) is when the force of blood pumping through the arteries is too strong. The arteries are the blood vessels that carry blood from the heart throughout the body. Hypertension forces the heart to work harder to pump blood and may cause arteries to become narrow or stiff. Untreated or uncontrolled hypertension can lead to a heart attack, heart failure, a stroke, kidney disease, and other problems. A blood pressure reading consists of a higher number over a lower number. Ideally, your blood pressure should be below 120/80. The first ("top") number is called the systolic pressure. It is a measure of the pressure in your arteries as your heart beats. The second ("bottom") number is called the diastolic pressure. It is a measure of the pressure in your arteries as the heart relaxes. What are the causes? The exact cause of this condition is not known. There are some conditions that result in high blood pressure. What increases the risk? Certain factors may make you more likely to develop high blood pressure. Some of these risk factors are under your control, including: Smoking. Not getting enough exercise or physical activity. Being overweight. Having too much fat, sugar, calories, or salt (sodium) in your diet. Drinking too much alcohol. Other risk factors include: Having a personal history of heart disease, diabetes, high cholesterol, or kidney disease. Stress. Having a family history of high blood pressure and high cholesterol. Having obstructive sleep apnea. Age. The risk increases with age. What are the signs or symptoms? High blood pressure may not cause symptoms. Very high blood pressure (hypertensive crisis) may cause: Headache. Fast or irregular heartbeats (palpitations). Shortness of breath. Nosebleed. Nausea and vomiting. Vision changes. Severe chest pain, dizziness, and seizures. How is this diagnosed? This condition is diagnosed by  measuring your blood pressure while you are seated, with your arm resting on a flat surface, your legs uncrossed, and your feet flat on the floor. The cuff of the blood pressure monitor will be placed directly against the skin of your upper arm at the level of your heart. Blood pressure should be measured at least twice using the same arm. Certain conditions can cause a difference in blood pressure between your right and left arms. If you have a high blood pressure reading during one visit or you have normal blood pressure with other risk factors, you may be asked to: Return on a different day to have your blood pressure checked again. Monitor your blood pressure at home for 1 week or longer. If you are diagnosed with hypertension, you may have other blood or imaging tests to help your health care provider understand your overall risk for other conditions. How is this treated? This condition is treated by making healthy lifestyle changes, such as eating healthy foods, exercising more, and reducing your alcohol intake. You may be referred for counseling on a healthy diet and physical activity. Your health care provider may prescribe medicine if lifestyle changes are not enough to get your blood pressure under control and if: Your systolic blood pressure is above 130. Your diastolic blood pressure is above 80. Your personal target blood pressure may vary depending on your medical conditions, your age, and other factors. Follow these instructions at home: Eating and drinking  Eat a diet that is high in fiber and potassium, and low in sodium, added sugar, and fat. An example of this eating plan is called the DASH diet. DASH stands for Dietary Approaches to Stop Hypertension. To eat this way: Eat   plenty of fresh fruits and vegetables. Try to fill one half of your plate at each meal with fruits and vegetables. Eat whole grains, such as whole-wheat pasta, brown rice, or whole-grain bread. Fill about one  fourth of your plate with whole grains. Eat or drink low-fat dairy products, such as skim milk or low-fat yogurt. Avoid fatty cuts of meat, processed or cured meats, and poultry with skin. Fill about one fourth of your plate with lean proteins, such as fish, chicken without skin, beans, eggs, or tofu. Avoid pre-made and processed foods. These tend to be higher in sodium, added sugar, and fat. Reduce your daily sodium intake. Many people with hypertension should eat less than 1,500 mg of sodium a day. Do not drink alcohol if: Your health care provider tells you not to drink. You are pregnant, may be pregnant, or are planning to become pregnant. If you drink alcohol: Limit how much you have to: 0-1 drink a day for women. 0-2 drinks a day for men. Know how much alcohol is in your drink. In the [place] one drink equals one 12 oz bottle of beer (355 mL), one 5 oz glass of wine (148 mL), or one 1 oz glass of hard liquor (44 mL). Lifestyle  Work with your health care provider to maintain a healthy body weight or to lose weight. Ask what an ideal weight is for you. Get at least 30 minutes of exercise that causes your heart to beat faster (aerobic exercise) most days of the week. Activities may include walking, swimming, or biking. Include exercise to strengthen your muscles (resistance exercise), such as Pilates or lifting weights, as part of your weekly exercise routine. Try to do these types of exercises for 30 minutes at least 3 days a week. Do not use any products that contain nicotine or tobacco. These products include cigarettes, chewing tobacco, and vaping devices, such as e-cigarettes. If you need help quitting, ask your health care provider. Monitor your blood pressure at home as told by your health care provider. Keep all follow-up visits. This is important. Medicines Take over-the-counter and prescription medicines only as told by your health care provider. Follow directions carefully. Blood  pressure medicines must be taken as prescribed. Do not skip doses of blood pressure medicine. Doing this puts you at risk for problems and can make the medicine less effective. Ask your health care provider about side effects or reactions to medicines that you should watch for. Contact a health care provider if you: Think you are having a reaction to a medicine you are taking. Have headaches that keep coming back (recurring). Feel dizzy. Have swelling in your ankles. Have trouble with your vision. Get help right away if you: Develop a severe headache or confusion. Have unusual weakness or numbness. Feel faint. Have severe pain in your chest or abdomen. Vomit repeatedly. Have trouble breathing. These symptoms may be an emergency. Get help right away. Call 911. Do not wait to see if the symptoms will go away. Do not drive yourself to the hospital. Summary Hypertension is when the force of blood pumping through your arteries is too strong. If this condition is not controlled, it may put you at risk for serious complications. Your personal target blood pressure may vary depending on your medical conditions, your age, and other factors. For most people, a normal blood pressure is less than 120/80. Hypertension is treated with lifestyle changes, medicines, or a combination of both. Lifestyle changes include losing weight, eating a healthy,   low-sodium diet, exercising more, and limiting alcohol. This information is not intended to replace advice given to you by your health care provider. Make sure you discuss any questions you have with your health care provider. Document Revised: 12/18/2020 Document Reviewed: 12/18/2020 Elsevier Patient Education  2024 [employer]  

## 2023-02-18 LAB — HIV ANTIBODY (ROUTINE TESTING W REFLEX): HIV 1&2 Ab, 4th Generation: NONREACTIVE

## 2023-02-18 LAB — HEPATITIS C ANTIBODY: Hepatitis C Ab: NONREACTIVE

## 2023-02-23 IMAGING — CT CT ABD-PELV W/ CM
2 of 5 series · 16 of 46 positions shown, 18 images · IV contrast (omnipaque)
Comparison: None.

CLINICAL DATA: Bowel obstruction suspected, abdominal pain, status
post gastric bypass

EXAM:
CT ABDOMEN AND PELVIS WITH CONTRAST
TECHNIQUE: Multidetector CT imaging of the abdomen and pelvis was performed
using the standard protocol following bolus administration of
intravenous contrast.
CONTRAST:  80mL OMNIPAQUE IOHEXOL 350 MG/ML SOLN, additional oral
enteric contrast

[Series 2: axial st · axial · 0.98mm/px · z∈[-448,-13]mm · 13 of 101 slices shown, 15 images]
[im 7/101  soft-tissue]
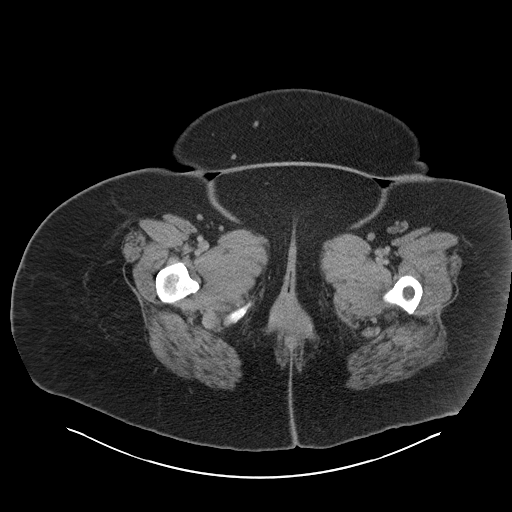
[im 7/101  bone]
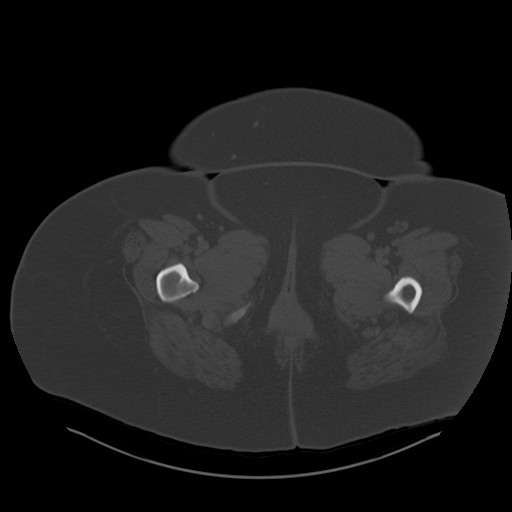
[im 14/101  soft-tissue]
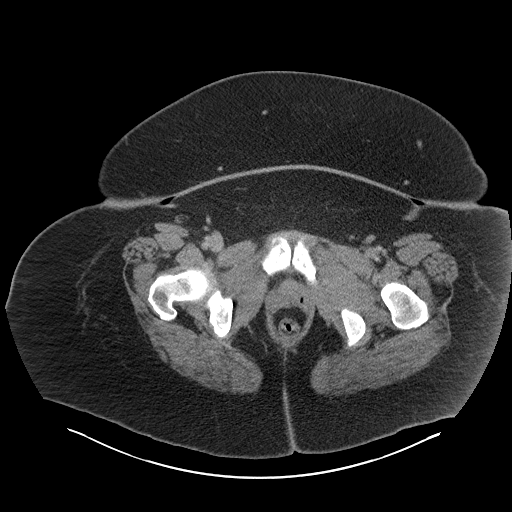
[im 21/101  soft-tissue]
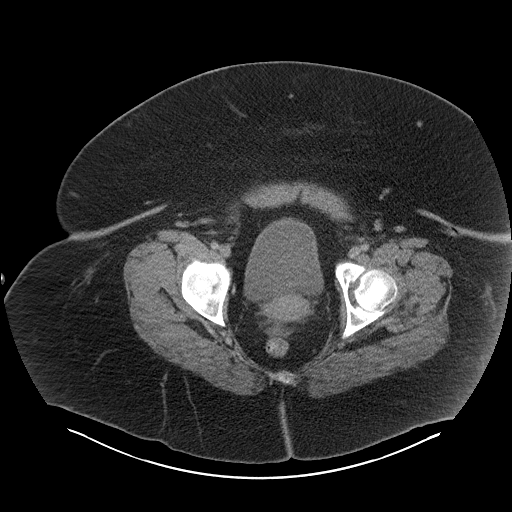
[im 27/101  soft-tissue]
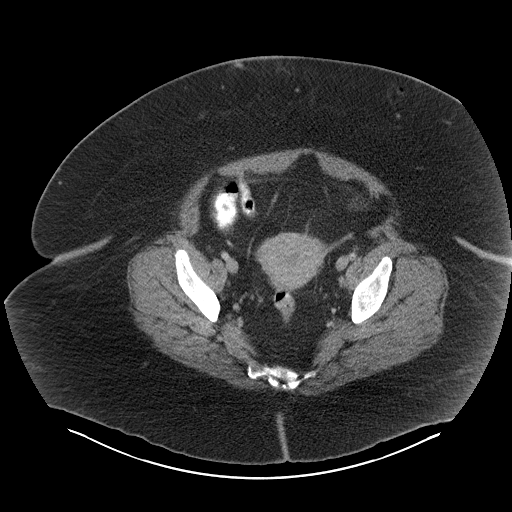
[im 34/101  soft-tissue]
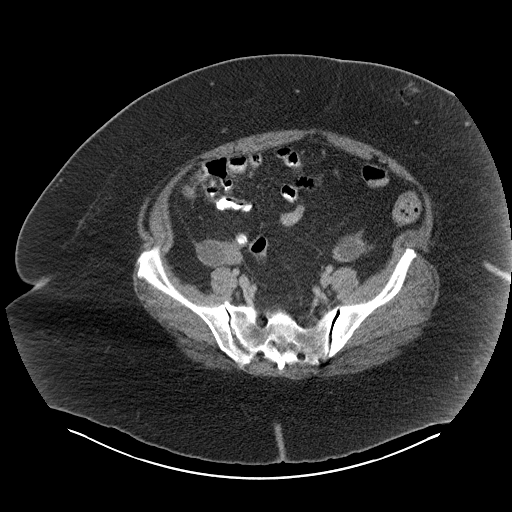
[im 41/101  soft-tissue]
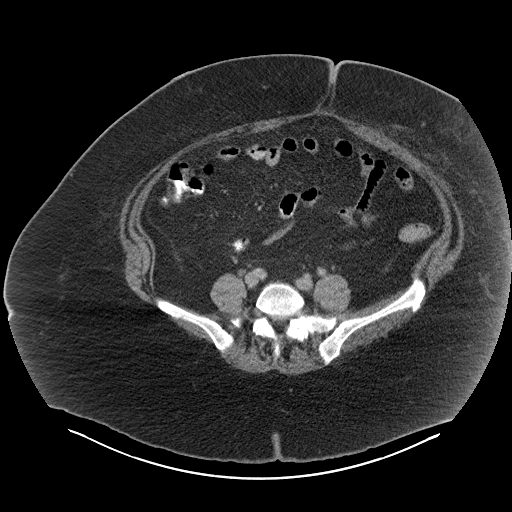
[im 54/101  soft-tissue]
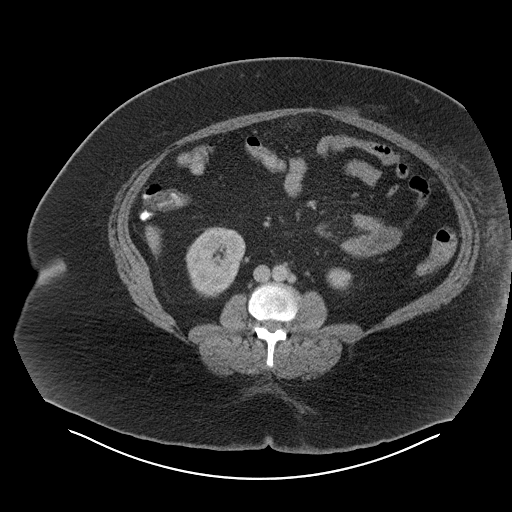
[im 61/101  soft-tissue]
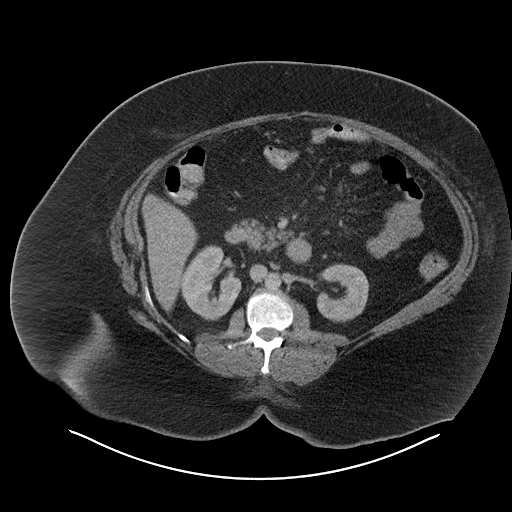
[im 67/101  soft-tissue]
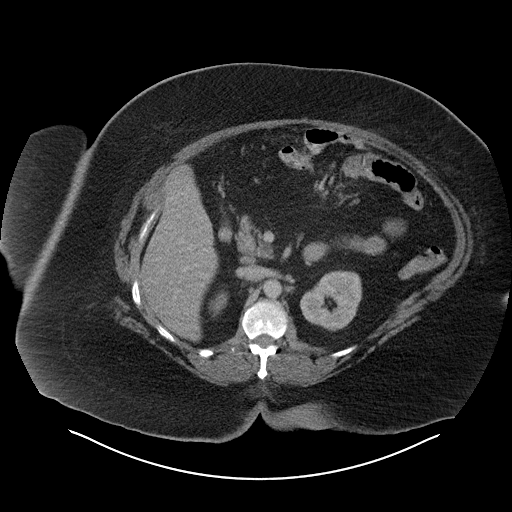
[im 67/101  bone]
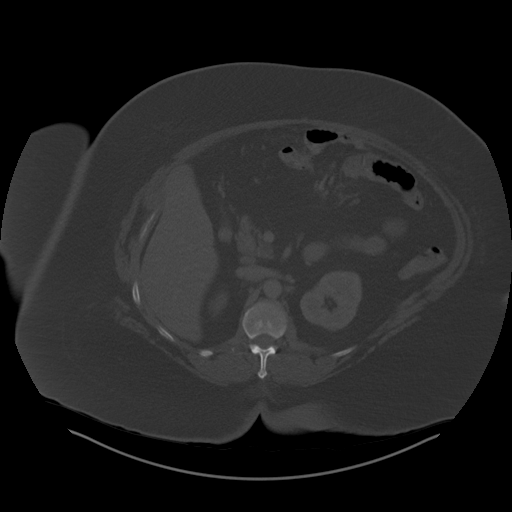
[im 74/101  soft-tissue]
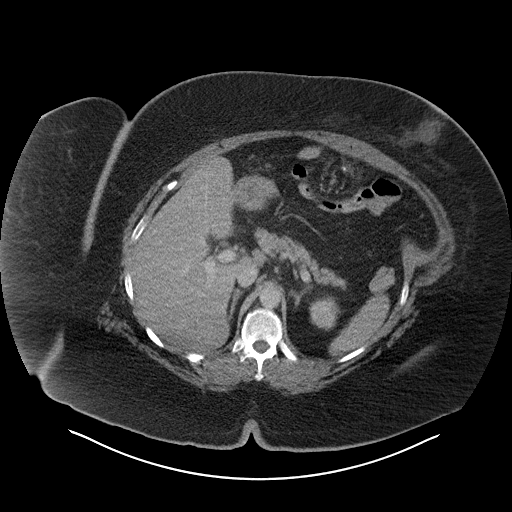
[im 81/101  soft-tissue]
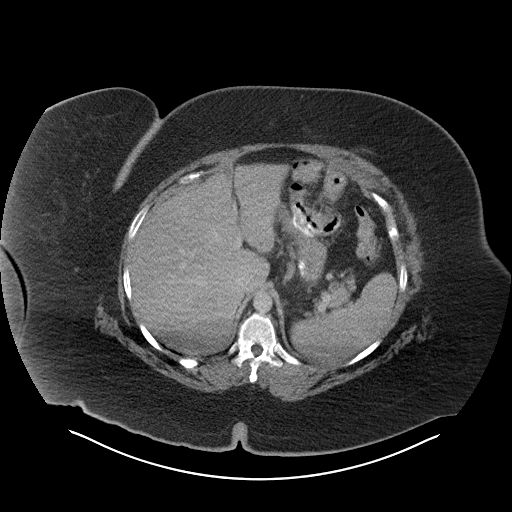
[im 87/101  soft-tissue]
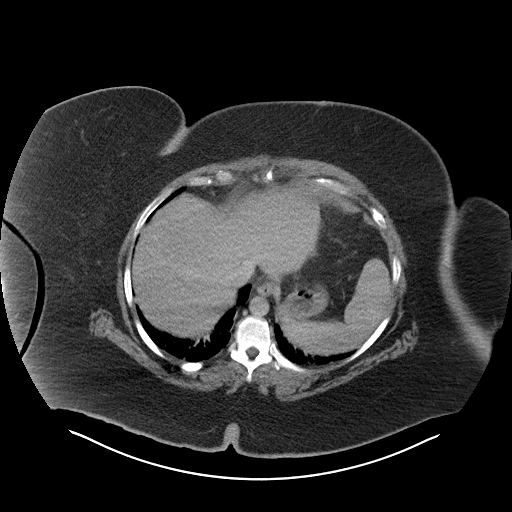
[im 94/101  soft-tissue]
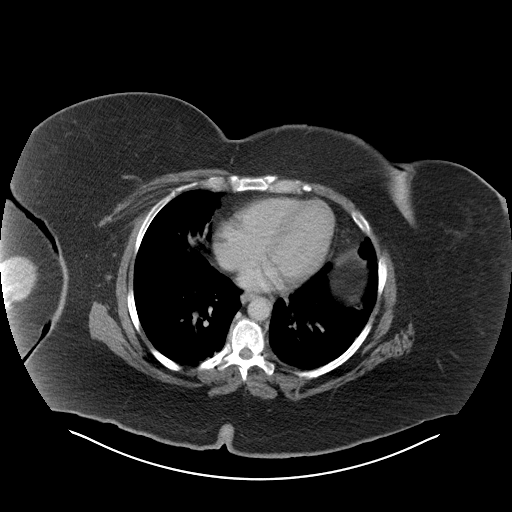

[Series 4: coronal st · coronal · 0.96mm/px · 3 of 201 slices shown]
[im 67/201  soft-tissue]
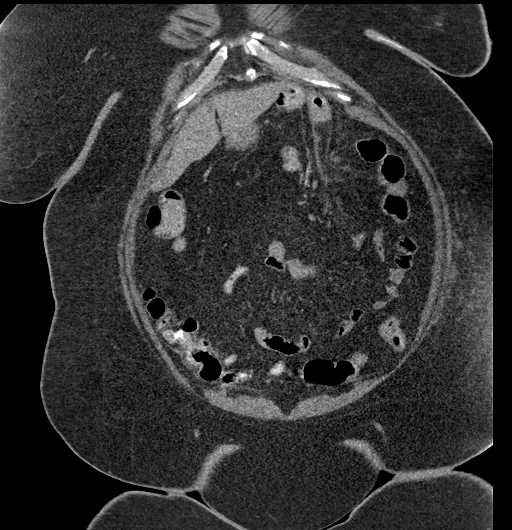
[im 89/201  soft-tissue]
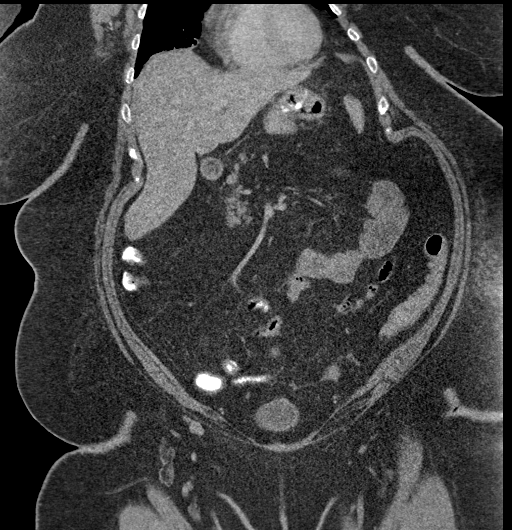
[im 112/201  soft-tissue]
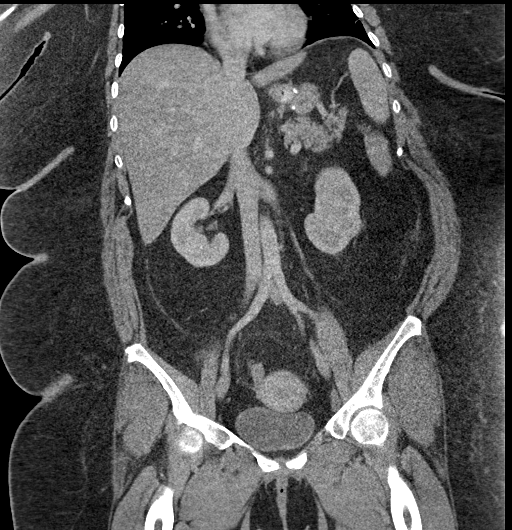

[16 of 46 positions shown; findings below may reference images not displayed]

FINDINGS: Lower chest: No acute abnormality. Bandlike scarring and or
atelectasis of the bilateral lung bases.

Hepatobiliary: No focal liver abnormality is seen. Status post
cholecystectomy. No biliary dilatation.

Pancreas: Unremarkable. No pancreatic ductal dilatation or
surrounding inflammatory changes.

Spleen: Normal in size without significant abnormality.

Adrenals/Urinary Tract: Adrenal glands are unremarkable. Kidneys are
normal, without renal calculi, solid lesion, or hydronephrosis.
Bladder is unremarkable.

Stomach/Bowel: Postoperative findings of Roux type gastric bypass.
No evidence of bowel wall thickening, distention, or inflammatory
changes.

Vascular/Lymphatic: Scattered aortic atherosclerosis. No enlarged
abdominal or pelvic lymph nodes.

Reproductive: No mass or other significant abnormality. Bilateral
ovarian cysts measuring 2.7 cm on the right and 2.0 cm on the left
(series 2, image 70). No follow-up imaging recommended. Note: This
recommendation does not apply to premenarchal patients and to those
with increased risk (genetic, family history, elevated tumor markers
or other high-risk factors) of ovarian cancer. Reference: JACR [DATE]):[id number]

Other: No abdominal wall hernia or abnormality. No abdominopelvic
ascites.

Musculoskeletal: No acute or significant osseous findings.
IMPRESSION: 1. No acute CT findings of the abdomen or pelvis to explain pain.
2. Postoperative findings of Roux type gastric bypass. No evidence
of bowel obstruction or other complication.
3. Status post cholecystectomy.

Aortic Atherosclerosis ([id number]-[id number]).

## 2023-02-26 ENCOUNTER — Other Ambulatory Visit: Payer: Self-pay | Admitting: Internal Medicine

## 2023-03-02 ENCOUNTER — Telehealth: Payer: Self-pay | Admitting: Internal Medicine

## 2023-03-02 NOTE — Telephone Encounter (Signed)
 Patient dropped off document FMLA, to be filled out by provider. Patient requested to send it back via Fax within 7-days. Document is located in providers tray at front office.Please advise at Mobile [phone] (mobile)

## 2023-03-04 ENCOUNTER — Ambulatory Visit: Payer: Self-pay | Admitting: Internal Medicine

## 2023-03-04 ENCOUNTER — Telehealth (INDEPENDENT_AMBULATORY_CARE_PROVIDER_SITE_OTHER): Payer: BC Managed Care – PPO | Admitting: Nurse Practitioner

## 2023-03-04 ENCOUNTER — Encounter: Payer: Self-pay | Admitting: Nurse Practitioner

## 2023-03-04 DIAGNOSIS — I10 Essential (primary) hypertension: Secondary | ICD-10-CM | POA: Diagnosis not present

## 2023-03-04 DIAGNOSIS — T3695XA Adverse effect of unspecified systemic antibiotic, initial encounter: Secondary | ICD-10-CM

## 2023-03-04 DIAGNOSIS — J329 Chronic sinusitis, unspecified: Secondary | ICD-10-CM | POA: Insufficient documentation

## 2023-03-04 DIAGNOSIS — R319 Hematuria, unspecified: Secondary | ICD-10-CM

## 2023-03-04 DIAGNOSIS — B379 Candidiasis, unspecified: Secondary | ICD-10-CM | POA: Diagnosis not present

## 2023-03-04 MED ORDER — BENZONATATE 200 MG PO CAPS: 200.0000 mg | ORAL_CAPSULE | Freq: Two times a day (BID) | ORAL | 0 refills | Status: DC | PRN

## 2023-03-04 MED ORDER — PREDNISONE 20 MG PO TABS: 20.0000 mg | ORAL_TABLET | Freq: Every day | ORAL | 0 refills | Status: DC

## 2023-03-04 MED ORDER — DOXYCYCLINE HYCLATE 100 MG PO TABS: 100.0000 mg | ORAL_TABLET | Freq: Two times a day (BID) | ORAL | 0 refills | Status: DC

## 2023-03-04 MED ORDER — FLUCONAZOLE 150 MG PO TABS: 150.0000 mg | ORAL_TABLET | Freq: Every day | ORAL | 0 refills | Status: DC

## 2023-03-04 NOTE — Telephone Encounter (Signed)
Placed inside office box 

## 2023-03-04 NOTE — Assessment & Plan Note (Signed)
 Has been present for about 3 weeks, treatment failure with cefdinir .  Will change to doxycycline .  Patient reports last menstrual period was greater than 1 year ago and feels there is no risk for pregnancy currently.  Will also treat with prednisone  20 mg daily x 5 days if no improvement after 48 hours of being on doxycycline .  She will continue the cough syrup as prescribed by last provider at night and avoid driving while taking this medication.  She will take benzonatate  cough suppression during the day as needed.

## 2023-03-04 NOTE — Telephone Encounter (Signed)
 Copied from CRM [id number]. Topic: Clinical - Red Word Triage >> Mar 04, 2023  7:49 AM Christine Ramirez wrote: [facility] that prompted transfer to Nurse Triage: Patient states she's weak and achy, coughing up green dark green mucus, now not coming up as much, eyes are red, feel very fatigued. Chest pressure and headaches comes and goes. Called off of work today to weak to get dressed, 2 weeks ago was given antibiotics 2 weeks ago finished those and still sick. Felt warm to the touch  Chief Complaint: cold symptoms & fever after ABX Symptoms: nasal/chest congestion,chest discomfort due to congestion, fatigue, fever, body aches, sore throat, nasal drainage Frequency: since on 02/17/23 given ABX and has become worse not better Pertinent Negatives: Patient denies chest pain Disposition: [] ED /[] Urgent Care (no appt availability in office) / [x] Appointment(In office/virtual)/ []  [facility]/ [] Home Care/ [] Refused Recommended Disposition /[] [facility]/ []  Follow-up with PCP Additional Notes: virtual appt made today  Reason for Disposition  Fever present > 3 days (72 hours)  Answer Assessment - Initial Assessment Questions 1. ONSET: When did the nasal discharge start?      Last office 02/17/23 and placed on ABX and now worse 2. AMOUNT: How much discharge is there?      Moderate 3. COUGH: Do you have a cough? If Yes, ask: Describe the color of your sputum (clear, white, yellow, green)     green amount  4. RESPIRATORY DISTRESS: Describe your breathing.      Some chest tightness 5. FEVER: Do you have a fever? If Yes, ask: What is your temperature, how was it measured, and when did it start?     Pt states feels warm all over 6. SEVERITY: Overall, how bad are you feeling right now? (e.g., doesn't interfere with normal activities, staying home from school/work, staying in bed)      Not able to go to work due to feeling tired and weak 7. OTHER SYMPTOMS: Do you have any  other symptoms? (e.g., sore throat, earache, wheezing, vomiting)     Nasal drainage, sore throat, body aches, nausea, nasal & chest congestion 8. PREGNANCY: Is there any chance you are pregnant? When was your last menstrual period?     no  Protocols used: Common Cold-A-AH

## 2023-03-04 NOTE — Assessment & Plan Note (Signed)
 Chronic Patient to continue olmesartan 20 mg daily Patient to return to office sometime next week for BMP to monitor kidney function.  She reports her understanding.

## 2023-03-04 NOTE — Assessment & Plan Note (Signed)
 Acute Send in Diflucan 150 mg tablet to be taken once

## 2023-03-04 NOTE — Progress Notes (Signed)
 Established Patient Office Visit  An audio/visual tele-health visit was completed today for this patient. I connected with  Christine Ramirez on 03/04/23 utilizing audio/visual technology and verified that I am speaking with the correct person using two identifiers. The patient was located at their home, and I was located at the office of [facility] at [facility] during the encounter. I discussed the limitations of evaluation and management by telemedicine. The patient expressed understanding and agreed to proceed.   Subjective   Patient ID: Christine Ramirez, female    DOB: Jun 28, 1965  Age: 58 y.o. MRN: [id number]  Chief Complaint  Patient presents with   Nasal Congestion    Nasal and chest congestion, was seen in December for this issue and still having it, mucus in green color, fatigue    Acute visit for cough. Has been present for about 3 weeks.  She is also experiencing intermittent chills with congestion, sinus pain, sore throat, productive cough, and bodyaches.  She has completed course of cefdinir  without improvement in her symptoms.  She also reports that she was started on olmesartan  at last office visit, but started this only 3 days ago.  So far is tolerating the medication well.  Does not have vital signs available today to discuss.  She also mention she thinks she is experiencing vaginal yeast infection as side effect from cefdinir .  She also noted that red blood cells were seen on last urinalysis.    Review of Systems  Constitutional:  Positive for diaphoresis (believes this is related to menopause) and malaise/fatigue. Negative for chills (at times) and fever.  HENT:  Positive for congestion, sinus pain and sore throat.   Respiratory:  Positive for cough and sputum production.   Cardiovascular:  Positive for chest pain (tightness).  Musculoskeletal:  Positive for myalgias.      Objective:     LMP  (LMP Unknown) Comment: patient reports she believes her LMP  was >1 year ago   Physical Exam Comprehensive physical exam not completed today as office visit was conducted remotely.  No evidence of acute respiratory distress.  Patient was alert and oriented, and appeared to have appropriate judgment.   No results found for any visits on 03/04/23.    The ASCVD Risk score (Arnett DK, et al., 2019) failed to calculate for the following reasons:   The systolic blood pressure is missing    Assessment & Plan:   Problem List Items Addressed This Visit       Cardiovascular and Mediastinum   Essential hypertension   Chronic Patient to continue olmesartan  20 mg daily Patient to return to office sometime next week for BMP to monitor kidney function.  She reports her understanding.      Relevant Orders   Basic metabolic panel     Respiratory   Sinusitis - Primary   Has been present for about 3 weeks, treatment failure with cefdinir .  Will change to doxycycline .  Patient reports last menstrual period was greater than 1 year ago and feels there is no risk for pregnancy currently.  Will also treat with prednisone  20 mg daily x 5 days if no improvement after 48 hours of being on doxycycline .  She will continue the cough syrup as prescribed by last provider at night and avoid driving while taking this medication.  She will take benzonatate  cough suppression during the day as needed.      Relevant Medications   benzonatate  (TESSALON ) 200 MG capsule  doxycycline  (VIBRA -TABS) 100 MG tablet   fluconazole  (DIFLUCAN ) 150 MG tablet   predniSONE  (DELTASONE ) 20 MG tablet     Other   Hematuria   Etiology unclear Patient to return to office for metabolic panel to be checked next week will also check urine testing for follow-up.  Further recommendations may be made based upon these results.      Relevant Orders   Urinalysis, Routine w reflex microscopic   Urine Culture   Microalbumin / creatinine urine ratio   Antibiotic-induced yeast infection    Acute Send in Diflucan  150 mg tablet to be taken once      Relevant Medications   fluconazole  (DIFLUCAN ) 150 MG tablet    Return if symptoms worsen or fail to improve within 7-10 days.    Lauraine FORBES Pereyra, NP

## 2023-03-04 NOTE — Assessment & Plan Note (Signed)
 Etiology unclear Patient to return to office for metabolic panel to be checked next week will also check urine testing for follow-up.  Further recommendations may be made based upon these results.

## 2023-03-05 NOTE — Telephone Encounter (Signed)
 I will need more information if this is a new FMLA request, renewal etc. If new what condition and what is patient asking for?

## 2023-03-11 ENCOUNTER — Encounter (HOSPITAL_COMMUNITY): Payer: Self-pay | Admitting: Internal Medicine

## 2023-03-23 ENCOUNTER — Telehealth: Payer: Self-pay | Admitting: Internal Medicine

## 2023-03-23 NOTE — Telephone Encounter (Signed)
It is not denied but we have sent her a mychart message requesting information about the FMLA (I.e. new or renewal and what medical condition this is for, what time off she is requesting, continuous or intermittent). We cannot fill out an fmla without this information

## 2023-03-23 NOTE — Telephone Encounter (Signed)
Patient husband called back in the fmla paper work is a renewal the  last four digits of  the case  is [id number] patient was out from dec 2024 and [place]  2025  02/13/2023 -  02/18/2023  vitally 03-04-2023/03-06-2023 she came back due to her needing a extra round antibiotics patient husband woul like a call back regarding this

## 2023-03-23 NOTE — Telephone Encounter (Signed)
Copied from CRM [id number]. Topic: General - Other >> Mar 23, 2023  3:34 PM Chantha C wrote: Reason for CRM: Patient's husband Ronaldo Miyamoto is asking why was the FMLA forms denied, patient states it's urgent and needs to be faxed today. Please call Ronaldo Miyamoto back at [phone]

## 2023-03-24 NOTE — Telephone Encounter (Signed)
Called patient and lvm

## 2023-03-24 NOTE — Telephone Encounter (Signed)
Copied from CRM [id number]. Topic: General - Call Back - No Documentation >> Mar 24, 2023 12:01 PM Corin V wrote: Reason for CRM: Patient's husband stated he missed a call from the office. He believes it is about FMLA paperwork. Please call Ronaldo Miyamoto at [phone].

## 2023-03-24 NOTE — Telephone Encounter (Signed)
Copied from CRM [id number]. Topic: General - Other >> Mar 24, 2023  1:31 PM Elizebeth Brooking wrote: Reason for CRM: Patient husband called in regarding FMLA paperwork and the status . I call the CAL for further assistance, nurse was busy at the time but wanted me to get information regarding the condition and dates and reasons   12/20 -12/24 : Bad cough , believed it to be bronchitis  visit on 12/24 01/08-01/10 : Continuation for antibiotics - Vitural Visit  televist on 01/08   Has not heard back from doctors, has to have doctors to write that up   Would like a callback with this matter

## 2023-03-24 NOTE — Telephone Encounter (Signed)
Pt's spouse is requesting a call from Dr. Frutoso Chase CMA regarding pw

## 2023-03-24 NOTE — Telephone Encounter (Signed)
Copied from CRM [id number]. Topic: General - Other >> Mar 24, 2023  8:29 AM Irine Seal wrote: Reason for CRM: Patient's husband Ronaldo Miyamoto, called back in regarding the  fmla paper work is a renewal,  would like a call back regarding this  checking for an update He is wanting to know if the clinic will add that the delay is not at the fault of his wife. Since she filed an extension and it has now passed, he is worried this will effect her employment.

## 2023-03-25 ENCOUNTER — Encounter: Payer: Self-pay | Admitting: Nurse Practitioner

## 2023-03-25 NOTE — Telephone Encounter (Signed)
Copied from CRM [id number]. Topic: General - Other >> Mar 25, 2023 11:01 AM Elizebeth Brooking wrote: Reason for CRM: Patient Husband called in stated that if they can please get that completed today. Is also asking if Dr.Crawford could give him a callback when this is completed

## 2023-03-25 NOTE — Telephone Encounter (Unsigned)
Copied from CRM [id number]. Topic: Clinical - Medical Advice >> Mar 25, 2023  2:34 PM Shelbie Proctor wrote: Reason for CRM: Patient [id number] states her supervisor FMLA was denied because the FMLA forms was not received from the office. Patient needs to see what can be done. Informed patient telephone 03/02/23. Patient was out of work 02/16/24 and 02/17/24 and went back to work 02/19/24. 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 Was not feeling better from antibotics and second time out of work 02/27/23, 03/04/23, 03/05/23 (holiday so not against patient), 03/06/23 and return back to work on 03/09/23. Also, please document that is wasn't patient fault for not getting the FMLA paperwork filled out on time to her employeer. Please call patient or through MyChart, it's an urgent matter.

## 2023-03-25 NOTE — Telephone Encounter (Signed)
I talked to Jiles Prows and she said she could possibly fill this out but we will need all appropriate information about request including start and stop dates for continuous leave which I do not see in chart

## 2023-03-25 NOTE — Telephone Encounter (Signed)
Pt's husband is requesting a status update. States they do not use mychart and the pt is at work and unable to take calls, please call pt's husband Ronaldo Miyamoto with any questions: [phone]

## 2023-03-26 ENCOUNTER — Telehealth: Payer: Self-pay

## 2023-03-26 NOTE — Telephone Encounter (Signed)
FMLA forms are ready to be picked up

## 2023-04-15 ENCOUNTER — Other Ambulatory Visit: Payer: Self-pay | Admitting: Internal Medicine

## 2023-04-15 DIAGNOSIS — I10 Essential (primary) hypertension: Secondary | ICD-10-CM

## 2023-04-23 ENCOUNTER — Encounter (HOSPITAL_COMMUNITY): Payer: Self-pay | Admitting: *Deleted

## 2023-04-23 NOTE — Progress Notes (Deleted)
 Cardiology Office Note:  .    Date:  04/23/2023  ID:  Christine Ramirez, DOB Jul 18, 1965, MRN [id number] PCP: Myrlene Broker, MD  [facility] Providers Cardiologist:  Christell Constant, MD { Click to update primary MD,subspecialty MD or APP then REFRESH:1}    CC: *** Consulted for the evaluation of *** at the behest of ***   History of Present Illness: Christine Ramirez is a 58 y.o. female ***  @Discussed  the use of AI scribe software for clinical note transcription with the patient, who gave verbal consent to proceed.  History of Present Illness             Relevant histories: .  Social *** ROS: As per HPI.   Studies Reviewed: .   Cardiac Studies & Procedures   ______________________________________________________________________________________________     ECHOCARDIOGRAM  ECHOCARDIOGRAM COMPLETE 06/08/2020  Narrative ECHOCARDIOGRAM REPORT    Patient Name:   Christine Ramirez Date of Exam: 06/08/2020 Medical Rec #:  [id number]          Height:       61.0 in Accession #:    [id number]         Weight:       315.9 lb Date of Birth:  08/11/1965          BSA:          2.294 m Patient Age:    54 years           BP:           140/70 mmHg Patient Gender: F                  HR:           79 bpm. Exam Location:  [address]  Procedure: 2D Echo, Cardiac Doppler and Color Doppler  Indications:    R01.1 Murmur  History:        Patient has no prior history of Echocardiogram examinations. COPD, Signs/Symptoms:Murmur; Risk Factors:Morbid obesity, Hypertension and Dyslipidemia.  Sonographer:    Samule Ohm RDCS Referring Phys: [id number] [facility] A Izora Ribas   Sonographer Comments: Technically difficult study due to poor echo windows, patient is morbidly obese, suboptimal parasternal window and suboptimal subcostal window. Image acquisition challenging due to patient body habitus and Image acquisition challenging due to  COPD. IMPRESSIONS   1. Left ventricular ejection fraction, by estimation, is 55 to 60%. The left ventricle has normal function. The left ventricle has no regional wall motion abnormalities. There is mild concentric left ventricular hypertrophy. Left ventricular diastolic parameters were normal. 2. Right ventricular systolic function is normal. The right ventricular size is normal. There is normal pulmonary artery systolic pressure. The estimated right ventricular systolic pressure is 20.8 mmHg. 3. The mitral valve is normal in structure. Trivial mitral valve regurgitation. No evidence of mitral stenosis. 4. The aortic valve was not well visualized. There is mild calcification of the aortic valve. There is mild thickening of the aortic valve. Aortic valve regurgitation is mild. Mild aortic valve sclerosis is present, with no evidence of aortic valve stenosis. 5. The inferior vena cava is normal in size with greater than 50% respiratory variability, suggesting right atrial pressure of 3 mmHg.  Comparison(s): No prior Echocardiogram.  FINDINGS Left Ventricle: Left ventricular ejection fraction, by estimation, is 55 to 60%. The left ventricle has normal function. The left ventricle has no regional wall motion abnormalities. The left ventricular internal cavity size was normal  in size. There is mild concentric left ventricular hypertrophy. Left ventricular diastolic parameters were normal.  Right Ventricle: The right ventricular size is normal. No increase in right ventricular wall thickness. Right ventricular systolic function is normal. There is normal pulmonary artery systolic pressure. The tricuspid regurgitant velocity is 2.11 m/s, and with an assumed right atrial pressure of 3 mmHg, the estimated right ventricular systolic pressure is 20.8 mmHg.  Left Atrium: Left atrial size was normal in size.  Right Atrium: Right atrial size was normal in size.  Pericardium: There is no evidence of  pericardial effusion.  Mitral Valve: The mitral valve is normal in structure. Mild mitral annular calcification. Trivial mitral valve regurgitation. No evidence of mitral valve stenosis.  Tricuspid Valve: The tricuspid valve is normal in structure. Tricuspid valve regurgitation is trivial.  Aortic Valve: The aortic valve was not well visualized. There is mild calcification of the aortic valve. There is mild thickening of the aortic valve. Aortic valve regurgitation is mild. Aortic regurgitation PHT measures 360 msec. Mild aortic valve sclerosis is present, with no evidence of aortic valve stenosis.  Pulmonic Valve: The pulmonic valve was not well visualized. Pulmonic valve regurgitation is trivial.  Aorta: The aortic root and ascending aorta are structurally normal, with no evidence of dilitation.  Venous: The inferior vena cava is normal in size with greater than 50% respiratory variability, suggesting right atrial pressure of 3 mmHg.  IAS/Shunts: No atrial level shunt detected by color flow Doppler.   LEFT VENTRICLE PLAX 2D LVIDd:         4.50 cm  Diastology LVIDs:         3.10 cm  LV e' medial:    10.10 cm/s LV PW:         1.00 cm  LV E/e' medial:  10.7 LV IVS:        1.10 cm  LV e' lateral:   12.00 cm/s LVOT diam:     2.00 cm  LV E/e' lateral: 9.0 LV SV:         83 LV SV Index:   36 LVOT Area:     3.14 cm   RIGHT VENTRICLE             IVC RV S prime:     12.00 cm/s  IVC diam: 1.70 cm TAPSE (M-mode): 2.1 cm RVSP:           20.8 mmHg  LEFT ATRIUM             Index       RIGHT ATRIUM           Index LA diam:        3.50 cm 1.53 cm/m  RA Pressure: 3.00 mmHg LA Vol (A2C):   37.2 ml 16.22 ml/m RA Area:     11.30 cm LA Vol (A4C):   49.8 ml 21.71 ml/m RA Volume:   24.10 ml  10.51 ml/m LA Biplane Vol: 43.9 ml 19.14 ml/m AORTIC VALVE LVOT Vmax:   128.00 cm/s LVOT Vmean:  78.800 cm/s LVOT VTI:    0.265 m AI PHT:      360 msec  AORTA Ao Root diam: 3.00 cm Ao Asc diam:   3.20 cm  MV E velocity: 108.00 cm/s  TRICUSPID VALVE MV A velocity: 80.50 cm/s   TR Peak grad:   17.8 mmHg MV E/A ratio:  1.34         TR Vmax:        211.00  cm/s Estimated RAP:  3.00 mmHg RVSP:           20.8 mmHg  SHUNTS Systemic VTI:  0.26 m Systemic Diam: 2.00 cm  Laurance Flatten MD Electronically signed by Laurance Flatten MD Signature Date/Time: 06/08/2020/11:01:18 AM    Final          ______________________________________________________________________________________________      Results           *** Risk Assessment/Calculations:    {Does this patient have ATRIAL FIBRILLATION?:[phone]}       Physical Exam:    VS:  There were no vitals taken for this visit.   Wt Readings from Last 3 Encounters:  02/17/23 226 lb (102.5 kg)  01/15/23 223 lb 6.4 oz (101.3 kg)  04/03/22 216 lb 3.2 oz (98.1 kg)    Gen: *** distress, *** obese/well nourished/malnourished   Neck: No JVD, *** carotid bruit Ears: *** Frank Sign Cardiac: No Rubs or Gallops, *** Murmur, ***cardia, *** radial pulses Respiratory: Clear to auscultation bilaterally, *** effort, ***  respiratory rate GI: Soft, nontender, non-distended *** MS: No *** edema; *** moves all extremities Integument: Skin feels *** Neuro:  At time of evaluation, alert and oriented to person/place/time/situation *** Psych: Normal affect, patient feels ***   ASSESSMENT AND PLAN: .    *** An EKG was ordered for *** and shows ***  Riley Lam, MD FASE [facility] Cardiologist [facility]  [address], [address] [place], [place] [address] [phone]  3:02 PM

## 2023-04-27 ENCOUNTER — Ambulatory Visit: Payer: Federal, State, Local not specified - PPO | Admitting: Internal Medicine

## 2023-04-30 ENCOUNTER — Telehealth: Admitting: Family Medicine

## 2023-04-30 DIAGNOSIS — F4321 Adjustment disorder with depressed mood: Secondary | ICD-10-CM | POA: Diagnosis not present

## 2023-04-30 DIAGNOSIS — W19XXXA Unspecified fall, initial encounter: Secondary | ICD-10-CM | POA: Diagnosis not present

## 2023-04-30 MED ORDER — BUSPIRONE HCL 5 MG PO TABS: 5.0000 mg | ORAL_TABLET | Freq: Three times a day (TID) | ORAL | 0 refills | Status: DC

## 2023-04-30 NOTE — Progress Notes (Signed)
 Virtual Visit Consent   [name], you are scheduled for a virtual visit with a [facility] provider today. Just as with appointments in the office, your consent must be obtained to participate. Your consent will be active for this visit and any virtual visit you may have with one of our providers in the next 365 days. If you have a MyChart account, a copy of this consent can be sent to you electronically.  As this is a virtual visit, video technology does not allow for your provider to perform a traditional examination. This may limit your provider's ability to fully assess your condition. If your provider identifies any concerns that need to be evaluated in person or the need to arrange testing (such as labs, EKG, etc.), we will make arrangements to do so. Although advances in technology are sophisticated, we cannot ensure that it will always work on either your end or our end. If the connection with a video visit is poor, the visit may have to be switched to a telephone visit. With either a video or telephone visit, we are not always able to ensure that we have a secure connection.  By engaging in this virtual visit, you consent to the provision of healthcare and authorize for your insurance to be billed (if applicable) for the services provided during this visit. Depending on your insurance coverage, you may receive a charge related to this service.  I need to obtain your verbal consent now. Are you willing to proceed with your visit today? ANYSA TACEY has provided verbal consent on 04/30/2023 for a virtual visit (video or telephone). Freddy Finner, NP  Date: 04/30/2023 10:54 AM   Virtual Visit via Video Note   I, Freddy Finner, connected with  Christine Ramirez  ([id number], 1965-10-30) on 04/30/23 at 10:45 AM EST by a video-enabled telemedicine application and verified that I am speaking with the correct person using two identifiers.  Location: Patient: Virtual Visit Location  Patient: Home Provider: Virtual Visit Location Provider: Home Office   I discussed the limitations of evaluation and management by telemedicine and the availability of in person appointments. The patient expressed understanding and agreed to proceed.    History of Present Illness: Christine Ramirez is a 58 y.o. who identifies as a female who was assigned female at birth, and is being seen today for depression.  Loss her cat of 18 years this week and it has been a hard couple of days. She was trying to get ready for work today and slipped on her bathroom rug. She reports some pain in Left thigh, but no bleeding, hitting head, or changes in mobility.    Problems:  Patient Active Problem List   Diagnosis Date Noted   Sinusitis 03/04/2023   Hematuria 03/04/2023   Antibiotic-induced yeast infection 03/04/2023   Primary hypertension 02/17/2023   Subacute cough 02/17/2023   Need for hepatitis C screening test 02/17/2023   Encounter for screening for HIV 02/17/2023   LRTI (lower respiratory tract infection) 02/17/2023   Aortic atherosclerosis (HCC) 04/03/2022   Rash 12/12/2021   Thoracic back pain 12/12/2021   OSA (obstructive sleep apnea) - mild 09/18/2020   Prediabetes 09/18/2020   Mixed hyperlipidemia 05/14/2020   Heart murmur 05/14/2020   Pain of both hip joints 09/01/2018   Tear of medial meniscus of knee 11/04/2017   Routine general medical examination at a health care facility 07/10/2017   Lumbar back pain with radiculopathy affecting lower extremity 03/17/2017  Chronic diarrhea 02/28/2016   Osteoarthritis 09/11/2015   Bilateral chronic knee pain 03/18/2015   Morbid obesity (HCC) 12/15/2014   Essential hypertension 12/15/2014   Hypersomnia 02/28/2014   Fatigue 02/28/2014   Snoring 02/28/2014   Depression 08/14/2011    Allergies: No Known Allergies Medications:  Current Outpatient Medications:    acetaminophen (TYLENOL) 500 MG tablet, Take 1,000 mg by mouth every 6 (six)  hours as needed for moderate pain., Disp: , Rfl:    albuterol (PROVENTIL) (2.5 MG/3ML) 0.083% nebulizer solution, Take 3 mLs (2.5 mg total) by nebulization every 6 (six) hours as needed for wheezing or shortness of breath., Disp: 60 mL, Rfl: 1   DULoxetine (CYMBALTA) 60 MG capsule, Take 2 capsules (120 mg total) by mouth daily., Disp: 180 capsule, Rfl: 3   nystatin-triamcinolone ointment (MYCOLOG), Apply 1 Application topically 2 (two) times daily., Disp: 100 g, Rfl: 3   olmesartan (BENICAR) 20 MG tablet, TAKE 1 TABLET BY MOUTH EVERY DAY, Disp: 90 tablet, Rfl: 0   ondansetron (ZOFRAN-ODT) 4 MG disintegrating tablet, Take 1 tablet (4 mg total) by mouth every 6 (six) hours as needed for nausea or vomiting., Disp: 20 tablet, Rfl: 0   rosuvastatin (CRESTOR) 5 MG tablet, TAKE 1 TABLET (5 MG TOTAL) BY MOUTH DAILY., Disp: 90 tablet, Rfl: 0  Observations/Objective: Patient is well-developed, well-nourished in no acute distress.  Resting comfortably  at home.  Head is normocephalic, atraumatic.  No labored breathing.  Speech is clear and coherent with logical content.  Patient is alert and oriented at baseline.  Tearful  Assessment and Plan:   1. Situational depression (Primary)  - busPIRone (BUSPAR) 5 MG tablet; Take 1 tablet (5 mg total) by mouth 3 (three) times daily.  Dispense: 90 tablet; Refill: 0   2. Fall, initial encounter  Death of beloved cat- of 18 yrs. Tearful during visit- added Buspar to help with mood. She is anxious about returning to work. Continued Cymbalta. She also had a fall- but reports just sore, strict follow up if any changes. I advised her to follow up with PCP, and send note to PCP, to make sure she is improving with her mood regarding the loss of her cat.  Reviewed side effects, risks and benefits of medication.    Patient acknowledged agreement and understanding of the plan.   Past Medical, Surgical, Social History, Allergies, and Medications have been  Reviewed.    Follow Up Instructions: I discussed the assessment and treatment plan with the patient. The patient was provided an opportunity to ask questions and all were answered. The patient agreed with the plan and demonstrated an understanding of the instructions.  A copy of instructions were sent to the patient via MyChart unless otherwise noted below.    The patient was advised to call back or seek an in-person evaluation if the symptoms worsen or if the condition fails to improve as anticipated.    Freddy Finner, NP

## 2023-04-30 NOTE — Patient Instructions (Addendum)
  Christine Ramirez, thank you for joining Freddy Finner, NP for today's virtual visit.  While this provider is not your primary care provider (PCP), if your PCP is located in our provider database this encounter information will be shared with them immediately following your visit.   A [facility] MyChart account gives you access to today's visit and all your visits, tests, and labs performed at [facility] " click here if you don't have a [facility] MyChart account or go to [email or web address]  Consent: (Patient) Christine Ramirez provided verbal consent for this virtual visit at the beginning of the encounter.  Current Medications:  Current Outpatient Medications:    busPIRone (BUSPAR) 5 MG tablet, Take 1 tablet (5 mg total) by mouth 3 (three) times daily., Disp: 90 tablet, Rfl: 0   acetaminophen (TYLENOL) 500 MG tablet, Take 1,000 mg by mouth every 6 (six) hours as needed for moderate pain., Disp: , Rfl:    albuterol (PROVENTIL) (2.5 MG/3ML) 0.083% nebulizer solution, Take 3 mLs (2.5 mg total) by nebulization every 6 (six) hours as needed for wheezing or shortness of breath., Disp: 60 mL, Rfl: 1   DULoxetine (CYMBALTA) 60 MG capsule, Take 2 capsules (120 mg total) by mouth daily., Disp: 180 capsule, Rfl: 3   nystatin-triamcinolone ointment (MYCOLOG), Apply 1 Application topically 2 (two) times daily., Disp: 100 g, Rfl: 3   olmesartan (BENICAR) 20 MG tablet, TAKE 1 TABLET BY MOUTH EVERY DAY, Disp: 90 tablet, Rfl: 0   ondansetron (ZOFRAN-ODT) 4 MG disintegrating tablet, Take 1 tablet (4 mg total) by mouth every 6 (six) hours as needed for nausea or vomiting., Disp: 20 tablet, Rfl: 0   rosuvastatin (CRESTOR) 5 MG tablet, TAKE 1 TABLET (5 MG TOTAL) BY MOUTH DAILY., Disp: 90 tablet, Rfl: 0   Medications ordered in this encounter:  Meds ordered this encounter  Medications   busPIRone (BUSPAR) 5 MG tablet    Sig: Take 1 tablet (5 mg total) by mouth 3 (three) times daily.     Dispense:  90 tablet    Refill:  0    Supervising Provider:   Merrilee Jansky [[id number]]     *If you need refills on other medications prior to your next appointment, please contact your pharmacy*  Follow-Up: Call back or seek an in-person evaluation if the symptoms worsen or if the condition fails to improve as anticipated.  [facility] [phone]  Other Instructions  -start buspar -work note in letters -follow up with PCP   -strict in person if thigh hurts or ability to move changes     If you have been instructed to have an in-person evaluation today at a local Urgent Care facility, please use the link below. It will take you to a list of all of our available [facility], including address, phone number and hours of operation. Please do not delay care.  [facility]  If you or a family member do not have a primary care provider, use the link below to schedule a visit and establish care. When you choose a [facility] primary care physician or advanced practice provider, you gain a long-term partner in health. Find a Primary Care Provider  Learn more about [facility] in-office and virtual care options: [facility] Get Care Now

## 2023-05-20 ENCOUNTER — Other Ambulatory Visit: Payer: Self-pay | Admitting: Internal Medicine

## 2023-05-25 DIAGNOSIS — K080 Exfoliation of teeth due to systemic causes: Secondary | ICD-10-CM | POA: Diagnosis not present

## 2023-06-22 ENCOUNTER — Other Ambulatory Visit: Payer: Self-pay | Admitting: Internal Medicine

## 2023-06-22 DIAGNOSIS — I10 Essential (primary) hypertension: Secondary | ICD-10-CM

## 2023-07-30 DIAGNOSIS — E782 Mixed hyperlipidemia: Secondary | ICD-10-CM | POA: Diagnosis not present

## 2023-07-30 DIAGNOSIS — I10 Essential (primary) hypertension: Secondary | ICD-10-CM | POA: Diagnosis not present

## 2023-07-30 DIAGNOSIS — Z9884 Bariatric surgery status: Secondary | ICD-10-CM | POA: Diagnosis not present

## 2023-08-20 ENCOUNTER — Ambulatory Visit: Admitting: Internal Medicine

## 2023-11-16 DIAGNOSIS — M170 Bilateral primary osteoarthritis of knee: Secondary | ICD-10-CM | POA: Diagnosis not present

## 2023-11-18 ENCOUNTER — Encounter: Payer: Self-pay | Admitting: Internal Medicine

## 2023-11-18 ENCOUNTER — Telehealth: Payer: Self-pay

## 2023-11-18 ENCOUNTER — Other Ambulatory Visit: Payer: Self-pay

## 2023-11-18 ENCOUNTER — Ambulatory Visit (INDEPENDENT_AMBULATORY_CARE_PROVIDER_SITE_OTHER): Admitting: Internal Medicine

## 2023-11-18 DIAGNOSIS — M25562 Pain in left knee: Secondary | ICD-10-CM

## 2023-11-18 DIAGNOSIS — Z9884 Bariatric surgery status: Secondary | ICD-10-CM

## 2023-11-18 DIAGNOSIS — I10 Essential (primary) hypertension: Secondary | ICD-10-CM | POA: Diagnosis not present

## 2023-11-18 DIAGNOSIS — M25561 Pain in right knee: Secondary | ICD-10-CM | POA: Diagnosis not present

## 2023-11-18 DIAGNOSIS — G8929 Other chronic pain: Secondary | ICD-10-CM

## 2023-11-18 MED ORDER — ROSUVASTATIN CALCIUM 5 MG PO TABS: 5.0000 mg | ORAL_TABLET | Freq: Every day | ORAL | 0 refills | Status: DC

## 2023-11-18 MED ORDER — ZEPBOUND 5 MG/0.5ML ~~LOC~~ SOAJ: 5.0000 mg | SUBCUTANEOUS | 0 refills | Status: AC

## 2023-11-18 MED ORDER — ZEPBOUND 2.5 MG/0.5ML ~~LOC~~ SOAJ: 2.5000 mg | SUBCUTANEOUS | 0 refills | Status: AC

## 2023-11-18 MED ORDER — OLMESARTAN MEDOXOMIL 20 MG PO TABS: 20.0000 mg | ORAL_TABLET | Freq: Every day | ORAL | 0 refills | Status: DC

## 2023-11-18 MED ORDER — ZEPBOUND 7.5 MG/0.5ML ~~LOC~~ SOAJ: 7.5000 mg | SUBCUTANEOUS | 0 refills | Status: AC

## 2023-11-18 MED ORDER — DULOXETINE HCL 60 MG PO CPEP: 120.0000 mg | ORAL_CAPSULE | Freq: Every day | ORAL | 3 refills | Status: AC

## 2023-11-18 MED ORDER — ZEPBOUND 10 MG/0.5ML ~~LOC~~ SOAJ: 10.0000 mg | SUBCUTANEOUS | 0 refills | Status: AC

## 2023-11-18 NOTE — Progress Notes (Signed)
 Subjective:   Patient ID: Christine Ramirez, female    DOB: 1965/11/12, 58 y.o.   MRN: [id number]  The patient is here for weight managment. Pertinent topics discussed: Discussed the use of AI scribe software for clinical note transcription with the patient, who gave verbal consent to proceed.  History of Present Illness Christine Ramirez is a 58 year old female who presents for weight management and evaluation of menopausal symptoms.  She has experienced significant weight loss from her highest weight of 327 lbs to her current weight of 224-225 lbs following gastric bypass surgery. However, she has been unable to lose further weight and has been 'stuck' in the 220s for several years. She has tried various weight loss strategies, including dietary changes. Her weight impacts her knees, as she previously used a cane due to knee pain, which has improved with weight loss.  She reports menopausal symptoms, including hot flashes, night sweats, and temperature fluctuations throughout the day and night. She experiences sweating after eating, which she attributes to changes in digestion post-gastric bypass.  She is currently taking duloxetine , which she finds beneficial. Her dietary habits include consuming protein shakes and struggling with hydration. She mentions a constant state of dehydration, with dark urine, and is trying to increase her water intake. She follows a bariatric diet focusing on protein intake but is exploring new dietary approaches that include more solid foods and balanced meals.  She has a history of constipation and occasional severe abdominal pain, which she associates with bowel movements. She reports regular bowel movements recently, with one to two per day or every other day.  Her social history includes a sedentary lifestyle since college, which she attributes to weight gain. She is trying to focus on herself and her health, aiming to retire in three years. Her husband  is retired and has a history of colon cancer and chemotherapy. Family history includes her mother being in a nursing home, and she expresses concern about her mother's health and the impact on her own life.  PMH, [facility], social history reviewed and updated  Review of Systems  Constitutional: Negative.   HENT: Negative.    Eyes: Negative.   Respiratory:  Negative for cough, chest tightness and shortness of breath.   Cardiovascular:  Negative for chest pain, palpitations and leg swelling.  Gastrointestinal:  Negative for abdominal distention, abdominal pain, constipation, diarrhea, nausea and vomiting.  Musculoskeletal:  Positive for arthralgias.  Skin: Negative.   Neurological: Negative.   Psychiatric/Behavioral: Negative.      Objective:  Physical Exam Constitutional:      Appearance: Normal appearance. She is obese.  HENT:     Head: Normocephalic.  Cardiovascular:     Rate and Rhythm: Normal rate and regular rhythm.  Pulmonary:     Effort: Pulmonary effort is normal.  Musculoskeletal:        General: Normal range of motion.  Skin:    General: Skin is warm and dry.  Neurological:     General: No focal deficit present.     Mental Status: She is alert and oriented to person, place, and time.     Vitals:   11/18/23 0926  BP: 136/80  Pulse: 71  Temp: 98.5 F (36.9 C)  TempSrc: Oral  SpO2: 99%  Weight: 224 lb (101.6 kg)  Height: 4' 11.75 (1.518 m)   Assessment and Plan Assessment & Plan Morbid Obesity status post gastric bypass   Her weight has plateaued at 224-225 lbs, with  difficulty losing additional weight. Concerns include protein intake and artificial sweeteners affecting hunger and calorie intake. Prescribed Zepbound  for weight loss, starting at the lowest dose and titrating up monthly based on tolerance and side effects. Encouraged a balanced diet focusing on natural protein sources and vegetables, with a daily protein intake of 80-90 grams, considering her  activity level and knee pain. Regular follow-up is advised to monitor weight loss progress and adjust treatment.  Knee osteoarthritis   Pain has significantly reduced with weight loss. She prefers to delay knee replacement surgery, focusing on further weight loss to improve mobility. Encouraged weight loss to improve knee symptoms and delay surgery. Advised maintaining physical activity to preserve muscle mass and improve joint function.  Menopausal symptoms   Symptoms include hot flashes, night sweats, and temperature fluctuations, potentially affecting weight loss due to insulin  resistance and metabolic changes.  Dehydration   Chronic dehydration is noted with dark urine and dry mouth. She has difficulty meeting hydration goals due to dietary restrictions post-gastric bypass. Encouraged consistent fluid intake, aiming for at least 50 ounces daily, including water, shakes, and high-water-content foods. Advised sipping fluids throughout the day rather than consuming large amounts at once.  Constipation   Intermittent constipation is present, with improved symptoms under current management, including regular bowel movements every 1-2 days. Continue current management strategies and monitor bowel habits, adjusting dietary fiber and fluid intake as needed.   Assessment & Plan:  I personally spent a total of 42 minutes in the care of the patient today including preparing to see the patient, performing a medically appropriate exam/evaluation, counseling and educating, and placing orders.

## 2023-11-18 NOTE — Telephone Encounter (Signed)
 Copied from CRM #[id number]. Topic: General - Other >> Nov 18, 2023 10:27 AM Mesmerise C wrote: Reason for CRM: Patient needs a note for work for today's appointment would like it to be accessed in [employer]

## 2023-11-18 NOTE — Patient Instructions (Signed)
 We have sent in zepbound  and will see you back 2-3 months to see how you are doing.

## 2023-11-18 NOTE — Assessment & Plan Note (Signed)
 Chronic dehydration is noted with dark urine and dry mouth. She has difficulty meeting hydration goals due to dietary restrictions post-gastric bypass. Encouraged consistent fluid intake, aiming for at least 50 ounces daily, including water, shakes, and high-water-content foods. Advised sipping fluids throughout the day rather than consuming large amounts at once.

## 2023-11-18 NOTE — Assessment & Plan Note (Signed)
 Pain has significantly reduced with weight loss. She prefers to delay knee replacement surgery, focusing on further weight loss to improve mobility. Encouraged weight loss to improve knee symptoms and delay surgery. Advised maintaining physical activity to preserve muscle mass and improve joint function.

## 2023-11-18 NOTE — Assessment & Plan Note (Signed)
 Her weight has plateaued at 224-225 lbs, with difficulty losing additional weight. Concerns include protein intake and artificial sweeteners affecting hunger and calorie intake. Prescribed Zepbound  for weight loss, starting at the lowest dose and titrating up monthly based on tolerance and side effects. Encouraged a balanced diet focusing on natural protein sources and vegetables, with a daily protein intake of 80-90 grams, considering her activity level and knee pain. Regular follow-up is advised to monitor weight loss progress and adjust treatment.

## 2023-11-23 NOTE — Progress Notes (Deleted)
 Cardiology Office Note:    Date:  11/23/2023   ID:  Christine Ramirez, DOB 07/10/1965, MRN [id number]  PCP:  Rollene Almarie LABOR, MD   [facility]  Cardiologist:  Stanly LABOR Leavens, MD Advanced Practice Provider:  No care team member to display Electrophysiologist:  None      CC: Secondary prevention follow up  History of Present Illness:    Christine Ramirez is a 58 y.o. female with a hx of NAFLD, COPD, Morbid Obesity, HTN, HLD, who presents for evaluation 05/14/20. 2017 saw Dr. Alveta prior to potential surgery with bening work up. 2022: Saw me with benign work up. 2024:She has lost ~ 120 pounds .     Past Medical History:  Diagnosis Date   Anemia    Anxiety    Arthritis    osteo knees   COPD (chronic obstructive pulmonary disease) (HCC)    chronic bronchitis   Depression    Fatty liver    GERD (gastroesophageal reflux disease)    H/O typhoid fever    age 45   Headache    Heart murmur    as a child   Hepatitis    age 78   Hyperlipidemia    Hypertension    Jaundice    age 80   Pneumonia    in past   PONV (postoperative nausea and vomiting)    Scarlet fever    age 1   Shortness of breath dyspnea     Past Surgical History:  Procedure Laterality Date   ABDOMINAL EXPLORATION SURGERY  1975   age 41 at [facility]-open procedure   APPENDECTOMY     CHOLECYSTECTOMY N/A 03/19/2015   Procedure: LAPAROSCOPIC CHOLECYSTECTOMY WITH INTRAOPERATIVE CHOLANGIOGRAM;  Surgeon: Camellia Blush, MD;  Location: [facility];  Service: General;  Laterality: N/A;   CHOLECYSTECTOMY     COLONOSCOPY WITH PROPOFOL  N/A 07/24/2016   Procedure: COLONOSCOPY WITH PROPOFOL ;  Surgeon: Dianna Specking, MD;  Location: [facility] ENDOSCOPY;  Service: Endoscopy;  Laterality: N/A;   DILATION AND CURETTAGE OF UTERUS  2000   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     Allergies:   Patient has no known allergies.   Social History   Socioeconomic History   Marital status: Married    Spouse name: Not on file   Number of children: Not on file   Years of education: Not on file   Highest education level: Associate degree: academic program  Occupational History   Not on file  Tobacco Use   Smoking status: Never   Smokeless tobacco: Never  Vaping Use   Vaping status: Never Used  Substance and Sexual Activity   Alcohol use: Not Currently    Comment: rarely   Drug use: No   Sexual activity: Yes    Birth control/protection: None  Other Topics Concern   Not on file  Social History Narrative   Not on file   Social Drivers of Health   Financial Resource Strain: Low Risk  (11/18/2023)   Overall Financial Resource Strain (CARDIA)    Difficulty of Paying Living Expenses: Not very hard  Food Insecurity: No Food Insecurity (11/18/2023)   Hunger Vital Sign  Worried About Programme researcher, broadcasting/film/video in the Last Year: Never true    Ran Out of Food in the Last Year: Never true  Transportation Needs: No Transportation Needs (11/18/2023)   PRAPARE - Administrator, Civil Service (Medical): No    Lack of Transportation (Non-Medical): No  Physical Activity: Inactive (11/18/2023)   Exercise Vital Sign    Days of Exercise per Week: 0 days    Minutes of Exercise per Session: Not on file  Stress: Stress Concern Present (11/18/2023)   [employer] of Occupational Health - Occupational Stress Questionnaire    Feeling of Stress: To some extent  Social Connections: Moderately Integrated (11/18/2023)   Social Connection and Isolation Panel    Frequency of Communication with Friends and Family: Once a week    Frequency of Social Gatherings with  Friends and Family: Once a week    Attends Religious Services: More than 4 times per year    Active Member of [employer] or Organizations: Yes    Attends Engineer, structural: More than 4 times per year    Marital Status: Married    Social: Comes with husband who is also my patient, former Production assistant, radio.  Family History: The patient's family history includes Alcohol abuse in her paternal grandfather; Arthritis in her father; Breast cancer (age of onset: 68) in her mother; Cancer in her maternal grandfather, maternal grandmother, and mother; Depression in her paternal grandmother; Heart disease in her maternal grandfather and maternal grandmother; Melanoma in her mother. History of coronary artery disease notable for grandfather. History of heart failure notable for CHF in [facility]. History of arrhythmia notable for no members. No history of bicuspid aortic valve or aortic aneurysm or dissection.    ROS:   Please see the history of present illness.     Studies Reviewed:      Cardiac Studies & Procedures   ______________________________________________________________________________________________     ECHOCARDIOGRAM  ECHOCARDIOGRAM COMPLETE 06/08/2020  Narrative ECHOCARDIOGRAM REPORT    Patient Name:   Christine Ramirez Date of Exam: 06/08/2020 Medical Rec #:  [id number]          Height:       61.0 in Accession #:    [id number]         Weight:       315.9 lb Date of Birth:  [birth year]          BSA:          2.294 m Patient Age:    54 years           BP:           140/70 mmHg Patient Gender: F                  HR:           79 bpm. Exam Location:  [address]  Procedure: 2D Echo, Cardiac Doppler and Color Doppler  Indications:    R01.1 Murmur  History:        Patient has no prior history of Echocardiogram examinations. COPD, Signs/Symptoms:Murmur; Risk Factors:Morbid obesity, Hypertension and Dyslipidemia.  Sonographer:    Elsie Bohr RDCS Referring Phys:  [id number] [facility] A SANTO   Sonographer Comments: Technically difficult study due to poor echo windows, patient is morbidly obese, suboptimal parasternal window and suboptimal subcostal window. Image acquisition challenging due to patient body habitus and Image acquisition challenging due to COPD. IMPRESSIONS   1. Left ventricular ejection fraction, by estimation, is  55 to 60%. The left ventricle has normal function. The left ventricle has no regional wall motion abnormalities. There is mild concentric left ventricular hypertrophy. Left ventricular diastolic parameters were normal. 2. Right ventricular systolic function is normal. The right ventricular size is normal. There is normal pulmonary artery systolic pressure. The estimated right ventricular systolic pressure is 20.8 mmHg. 3. The mitral valve is normal in structure. Trivial mitral valve regurgitation. No evidence of mitral stenosis. 4. The aortic valve was not well visualized. There is mild calcification of the aortic valve. There is mild thickening of the aortic valve. Aortic valve regurgitation is mild. Mild aortic valve sclerosis is present, with no evidence of aortic valve stenosis. 5. The inferior vena cava is normal in size with greater than 50% respiratory variability, suggesting right atrial pressure of 3 mmHg.  Comparison(s): No prior Echocardiogram.  FINDINGS Left Ventricle: Left ventricular ejection fraction, by estimation, is 55 to 60%. The left ventricle has normal function. The left ventricle has no regional wall motion abnormalities. The left ventricular internal cavity size was normal in size. There is mild concentric left ventricular hypertrophy. Left ventricular diastolic parameters were normal.  Right Ventricle: The right ventricular size is normal. No increase in right ventricular wall thickness. Right ventricular systolic function is normal. There is normal pulmonary artery systolic pressure. The tricuspid  regurgitant velocity is 2.11 m/s, and with an assumed right atrial pressure of 3 mmHg, the estimated right ventricular systolic pressure is 20.8 mmHg.  Left Atrium: Left atrial size was normal in size.  Right Atrium: Right atrial size was normal in size.  Pericardium: There is no evidence of pericardial effusion.  Mitral Valve: The mitral valve is normal in structure. Mild mitral annular calcification. Trivial mitral valve regurgitation. No evidence of mitral valve stenosis.  Tricuspid Valve: The tricuspid valve is normal in structure. Tricuspid valve regurgitation is trivial.  Aortic Valve: The aortic valve was not well visualized. There is mild calcification of the aortic valve. There is mild thickening of the aortic valve. Aortic valve regurgitation is mild. Aortic regurgitation PHT measures 360 msec. Mild aortic valve sclerosis is present, with no evidence of aortic valve stenosis.  Pulmonic Valve: The pulmonic valve was not well visualized. Pulmonic valve regurgitation is trivial.  Aorta: The aortic root and ascending aorta are structurally normal, with no evidence of dilitation.  Venous: The inferior vena cava is normal in size with greater than 50% respiratory variability, suggesting right atrial pressure of 3 mmHg.  IAS/Shunts: No atrial level shunt detected by color flow Doppler.   LEFT VENTRICLE PLAX 2D LVIDd:         4.50 cm  Diastology LVIDs:         3.10 cm  LV e' medial:    10.10 cm/s LV PW:         1.00 cm  LV E/e' medial:  10.7 LV IVS:        1.10 cm  LV e' lateral:   12.00 cm/s LVOT diam:     2.00 cm  LV E/e' lateral: 9.0 LV SV:         83 LV SV Index:   36 LVOT Area:     3.14 cm   RIGHT VENTRICLE             IVC RV S prime:     12.00 cm/s  IVC diam: 1.70 cm TAPSE (M-mode): 2.1 cm RVSP:           20.8 mmHg  LEFT ATRIUM             Index       RIGHT ATRIUM           Index LA diam:        3.50 cm 1.53 cm/m  RA Pressure: 3.00 mmHg LA Vol (A2C):   37.2 ml  16.22 ml/m RA Area:     11.30 cm LA Vol (A4C):   49.8 ml 21.71 ml/m RA Volume:   24.10 ml  10.51 ml/m LA Biplane Vol: 43.9 ml 19.14 ml/m AORTIC VALVE LVOT Vmax:   128.00 cm/s LVOT Vmean:  78.800 cm/s LVOT VTI:    0.265 m AI PHT:      360 msec  AORTA Ao Root diam: 3.00 cm Ao Asc diam:  3.20 cm  MV E velocity: 108.00 cm/s  TRICUSPID VALVE MV A velocity: 80.50 cm/s   TR Peak grad:   17.8 mmHg MV E/A ratio:  1.34         TR Vmax:        211.00 cm/s Estimated RAP:  3.00 mmHg RVSP:           20.8 mmHg  SHUNTS Systemic VTI:  0.26 m Systemic Diam: 2.00 cm  Powell Sorrow MD Electronically signed by Powell Sorrow MD Signature Date/Time: 06/08/2020/11:01:18 AM    Final          ______________________________________________________________________________________________      Recent Labs: 02/17/2023: ALT 22; BUN 11; Creatinine, Ser 0.66; Hemoglobin 12.6; Platelets 248.0; Potassium 4.0; Sodium 141; TSH 1.95  Recent Lipid Panel    Component Value Date/Time   CHOL 210 (H) 04/03/2022 0942   TRIG 65 04/03/2022 0942   HDL 61 04/03/2022 0942   CHOLHDL 3.4 04/03/2022 0942   CHOLHDL 4 01/18/2020 1614   VLDL 26.0 01/18/2020 1614   LDLCALC 137 (H) 04/03/2022 0942    Physical Exam:    VS:  There were no vitals taken for this visit.    Wt Readings from Last 3 Encounters:  11/18/23 224 lb (101.6 kg)  02/17/23 226 lb (102.5 kg)  01/15/23 223 lb 6.4 oz (101.3 kg)    Gen: no distress,  morbid obesity  Neck: No JVD,  Cardiac: No Rubs or Gallops, soft systolic murmur, RRR +2 radial pulses Respiratory: Clear to auscultation bilaterally, normal effort, normal  respiratory rate GI: Soft, nontender, non-distended  MS: No  edema;  moves all extremities Integument: Skin feels warm Neuro:  At time of evaluation, alert and oriented to person/place/time/situation  Psych: Normal affect, patient feels feel   ASSESSMENT/PLAN:    HTN Morbid Obesity - BP at goal with  weight loss - discussed exercise start given her former history of aerobics  - now controlled without medication  HLD Aortic atherosclerosis (09/2020 CT Abdomen Pelvis) - discussed diet around hot flashes and chocolate, specifically dark chocolate - reviewed CT - discussed Pilates for Sleep and gave education - Fasting Lipids today  One year me or APP  Stanly Leavens, MD FASE [facility] Cardiologist [facility]  [address] [place], [place] [address] [phone]  7:45 AM

## 2023-11-24 ENCOUNTER — Ambulatory Visit: Admitting: Internal Medicine

## 2023-11-30 ENCOUNTER — Other Ambulatory Visit (HOSPITAL_COMMUNITY): Payer: Self-pay

## 2023-11-30 ENCOUNTER — Telehealth: Payer: Self-pay

## 2023-11-30 NOTE — Telephone Encounter (Signed)
 Pharmacy Patient Advocate Encounter   Received notification from Onbase that prior authorization for Zepbound  2.5MG /0.5ML pen-injectors  is required/requested.   Insurance verification completed.   The patient is insured through [employer].   Per test claim: PA required; PA submitted to above mentioned insurance via Latent Key/confirmation #/EOC [id number] Status is pending

## 2023-11-30 NOTE — Telephone Encounter (Signed)
 Pharmacy Patient Advocate Encounter  Received notification from [facility] that Prior Authorization for Zepbound  2.5MG /0.5ML pen-injectors  has been DENIED.  See denial reason below. No denial letter attached in CMM. Will attach denial letter to Media tab once received.   PA #/Case ID/Reference #: [id number]

## 2024-01-25 ENCOUNTER — Ambulatory Visit: Attending: Internal Medicine | Admitting: Internal Medicine

## 2024-01-25 VITALS — BP 109/75 | HR 67 | Ht 59.0 in | Wt 220.0 lb

## 2024-01-25 DIAGNOSIS — I351 Nonrheumatic aortic (valve) insufficiency: Secondary | ICD-10-CM

## 2024-01-25 DIAGNOSIS — E782 Mixed hyperlipidemia: Secondary | ICD-10-CM | POA: Diagnosis not present

## 2024-01-25 DIAGNOSIS — I700 Atherosclerosis of aorta: Secondary | ICD-10-CM

## 2024-01-25 DIAGNOSIS — I10 Essential (primary) hypertension: Secondary | ICD-10-CM

## 2024-01-25 NOTE — Patient Instructions (Signed)
 Medication Instructions:   NO CHANGES  *If you need a refill on your cardiac medications before your next appointment, please call your pharmacy*  Lab Work:  FASTING lipid panel to assess cholesterol levels in April 2026  If you have labs (blood work) drawn today and your tests are completely normal, you will receive your results only by: MyChart Message (if you have MyChart) OR A paper copy in the mail If you have any lab test that is abnormal or we need to change your treatment, we will call you to review the results.  Testing/Procedures:  Your physician has requested that you have an echocardiogram. Echocardiography is a painless test that uses sound waves to create images of your heart. It provides your doctor with information about the size and shape of your heart and how well your heart's chambers and valves are working. This procedure takes approximately one hour. There are no restrictions for this procedure. Please do NOT wear cologne, perfume, aftershave, or lotions (deodorant is allowed). Please arrive 15 minutes prior to your appointment time.  Please note: We ask at that you not bring children with you during ultrasound (echo/ vascular) testing. Due to room size and safety concerns, children are not allowed in the ultrasound rooms during exams. Our front office staff cannot provide observation of children in our lobby area while testing is being conducted. An adult accompanying a patient to their appointment will only be allowed in the ultrasound room at the discretion of the ultrasound technician under special circumstances. We apologize for any inconvenience.   Follow-Up: At [facility], you and your health needs are our priority.  As part of our continuing mission to provide you with exceptional heart care, our providers are all part of one team.  This team includes your primary Cardiologist (physician) and Advanced Practice Providers or APPs (Physician Assistants  and Nurse Practitioners) who all work together to provide you with the care you need, when you need it.  Your next appointment:    12 months with Dr. Santo  We recommend signing up for the patient portal called MyChart.  Sign up information is provided on this After Visit Summary.  MyChart is used to connect with patients for Virtual Visits (Telemedicine).  Patients are able to view lab/test results, encounter notes, upcoming appointments, etc.  Non-urgent messages can be sent to your provider as well.   To learn more about what you can do with MyChart, go to [email or web address].   Other Instructions

## 2024-01-25 NOTE — Progress Notes (Signed)
 Cardiology Office Note:    Date:  01/25/2024   ID:  Christine Ramirez, DOB 10-29-1965, MRN [id number]  PCP:  Rollene Almarie LABOR, MD   [facility]  Cardiologist:  Stanly LABOR Leavens, MD Advanced Practice Provider:  No care team member to display Electrophysiologist:  None      CC: Secondary prevention follow up  History of Present Illness:    Christine Ramirez is a 58 y.o. female with a hx of NAFLD, COPD, Morbid Obesity, HTN, HLD, who presents for evaluation 05/14/20. 2017 saw Dr. Alveta prior to potential surgery with bening work up. 2022: Saw me with benign work up. 2024: Lost 120 lbs  Christine Ramirez is a 58 year old female with hypertension and morbid obesity who presents for secondary prevention assessment.  She is experiencing muscle loss but stagnant weight and fat loss while on Ozempic. She plans to start Zepbound  in 2026, pending financial arrangements. Her current medication regimen includes rosuvastatin  5 mg for hyperlipidemia.  Her social history includes significant stress due to recent family events, including attending a funeral for her husband's uncle who passed away from ALS and her mother's debility in a nursing home. She visits her mother weekly, which impacts her available time. Her job is described as busy and somewhat stressful.  No chest pain, shortness of breath, or palpitations.  She is currently on olmesartan  for hypertension management. She has a history of mild aortic regurgitation and aortic atherosclerosis, both noted in 2022.  Past Medical History:  Diagnosis Date   Anemia    Anxiety    Arthritis    osteo knees   COPD (chronic obstructive pulmonary disease) (HCC)    chronic bronchitis   Depression    Fatty liver    GERD (gastroesophageal reflux disease)    H/O typhoid fever    age 59   Headache    Heart murmur    as a child   Hepatitis    age 61   Hyperlipidemia    Hypertension    Jaundice    age 69    Pneumonia    in past   PONV (postoperative nausea and vomiting)    Scarlet fever    age 89   Shortness of breath dyspnea     Past Surgical History:  Procedure Laterality Date   ABDOMINAL EXPLORATION SURGERY  1975   age 31 at [facility]-open procedure   APPENDECTOMY     CHOLECYSTECTOMY N/A 03/19/2015   Procedure: LAPAROSCOPIC CHOLECYSTECTOMY WITH INTRAOPERATIVE CHOLANGIOGRAM;  Surgeon: Camellia Blush, MD;  Location: [facility];  Service: General;  Laterality: N/A;   CHOLECYSTECTOMY     COLONOSCOPY WITH PROPOFOL  N/A 07/24/2016   Procedure: COLONOSCOPY WITH PROPOFOL ;  Surgeon: Dianna Specking, MD;  Location: [facility] ENDOSCOPY;  Service: Endoscopy;  Laterality: N/A;   DILATION AND CURETTAGE OF UTERUS  2000   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   gabapentin  (NEURONTIN ) 100 MG capsule TAKE 1-3 CAPSULES BY MOUTH EVERY DAY AT BEDTIME AS NEEDED   nystatin -triamcinolone  ointment (MYCOLOG) APPLY TO AFFECTED AREA TWICE A DAY   olmesartan  (BENICAR ) 20 MG tablet Take 1 tablet (20 mg total) by mouth daily.  rosuvastatin  (CRESTOR ) 5 MG tablet Take 1 tablet (5 mg total) by mouth daily.   Semaglutide,0.25 or 0.5MG /DOS, (OZEMPIC, 0.25 OR 0.5 MG/DOSE,) 2 MG/3ML SOPN Inject into the skin once a week.   tirzepatide  (ZEPBOUND ) 10 MG/0.5ML Pen Inject 10 mg into the skin once a week.   tirzepatide  (ZEPBOUND ) 2.5 MG/0.5ML Pen Inject 2.5 mg into the skin once a week.   tirzepatide  (ZEPBOUND ) 5 MG/0.5ML Pen Inject 5 mg into the skin once a week.   tirzepatide  (ZEPBOUND ) 7.5 MG/0.5ML Pen Inject 7.5 mg into the skin once a week.     Allergies:   Patient has no known allergies.   Social History    Socioeconomic History   Marital status: Married    Spouse name: Not on file   Number of children: Not on file   Years of education: Not on file   Highest education level: Associate degree: academic program  Occupational History   Not on file  Tobacco Use   Smoking status: Never   Smokeless tobacco: Never  Vaping Use   Vaping status: Never Used  Substance and Sexual Activity   Alcohol use: Not Currently    Comment: rarely   Drug use: No   Sexual activity: Yes    Birth control/protection: None  Other Topics Concern   Not on file  Social History Narrative   Not on file   Social Drivers of Health   Financial Resource Strain: Low Risk  (11/18/2023)   Overall Financial Resource Strain (CARDIA)    Difficulty of Paying Living Expenses: Not very hard  Food Insecurity: No Food Insecurity (11/18/2023)   Hunger Vital Sign    Worried About Running Out of Food in the Last Year: Never true    Ran Out of Food in the Last Year: Never true  Transportation Needs: No Transportation Needs (11/18/2023)   PRAPARE - Administrator, Civil Service (Medical): No    Lack of Transportation (Non-Medical): No  Physical Activity: Inactive (11/18/2023)   Exercise Vital Sign    Days of Exercise per Week: 0 days    Minutes of Exercise per Session: Not on file  Stress: Stress Concern Present (11/18/2023)   [employer] of Occupational Health - Occupational Stress Questionnaire    Feeling of Stress: To some extent  Social Connections: Moderately Integrated (11/18/2023)   Social Connection and Isolation Panel    Frequency of Communication with Friends and Family: Once a week    Frequency of Social Gatherings with Friends and Family: Once a week    Attends Religious Services: More than 4 times per year    Active Member of [employer] or Organizations: Yes    Attends Engineer, Structural: More than 4 times per year    Marital Status: Married    Social: Comes with husband who is also my  patient, former production assistant, radio.  Family History: The patient's family history includes Alcohol abuse in her paternal grandfather; Arthritis in her father; Breast cancer (age of onset: 57) in her mother; Cancer in her maternal grandfather, maternal grandmother, and mother; Depression in her paternal grandmother; Heart disease in her maternal grandfather and maternal grandmother; Melanoma in her mother. History of coronary artery disease notable for grandfather. History of heart failure notable for CHF in [facility]. History of arrhythmia notable for no members. No history of bicuspid aortic valve or aortic aneurysm or dissection.    ROS:   Please see the history of present illness.     EKGs/Labs/Other  Studies Reviewed:    The following studies were reviewed today:  EKG:   04/03/22: SR rate 69 05/14/20: SR 89 WNL  Cardiac Studies & Procedures   ______________________________________________________________________________________________     ECHOCARDIOGRAM  ECHOCARDIOGRAM COMPLETE 06/08/2020  Narrative ECHOCARDIOGRAM REPORT    Patient Name:   Christine Ramirez Date of Exam: 06/08/2020 Medical Rec #:  [id number]          Height:       61.0 in Accession #:    [id number]         Weight:       315.9 lb Date of Birth:  [birth year]          BSA:          2.294 m Patient Age:    54 years           BP:           140/70 mmHg Patient Gender: F                  HR:           79 bpm. Exam Location:  [address]  Procedure: 2D Echo, Cardiac Doppler and Color Doppler  Indications:    R01.1 Murmur  History:        Patient has no prior history of Echocardiogram examinations. COPD, Signs/Symptoms:Murmur; Risk Factors:Morbid obesity, Hypertension and Dyslipidemia.  Sonographer:    Elsie Bohr RDCS Referring Phys: [id number] [facility]   Sonographer Comments: Technically difficult study due to poor echo windows, patient is morbidly obese, suboptimal parasternal window and  suboptimal subcostal window. Image acquisition challenging due to patient body habitus and Image acquisition challenging due to COPD. IMPRESSIONS   1. Left ventricular ejection fraction, by estimation, is 55 to 60%. The left ventricle has normal function. The left ventricle has no regional wall motion abnormalities. There is mild concentric left ventricular hypertrophy. Left ventricular diastolic parameters were normal. 2. Right ventricular systolic function is normal. The right ventricular size is normal. There is normal pulmonary artery systolic pressure. The estimated right ventricular systolic pressure is 20.8 mmHg. 3. The mitral valve is normal in structure. Trivial mitral valve regurgitation. No evidence of mitral stenosis. 4. The aortic valve was not well visualized. There is mild calcification of the aortic valve. There is mild thickening of the aortic valve. Aortic valve regurgitation is mild. Mild aortic valve sclerosis is present, with no evidence of aortic valve stenosis. 5. The inferior vena cava is normal in size with greater than 50% respiratory variability, suggesting right atrial pressure of 3 mmHg.  Comparison(s): No prior Echocardiogram.  FINDINGS Left Ventricle: Left ventricular ejection fraction, by estimation, is 55 to 60%. The left ventricle has normal function. The left ventricle has no regional wall motion abnormalities. The left ventricular internal cavity size was normal in size. There is mild concentric left ventricular hypertrophy. Left ventricular diastolic parameters were normal.  Right Ventricle: The right ventricular size is normal. No increase in right ventricular wall thickness. Right ventricular systolic function is normal. There is normal pulmonary artery systolic pressure. The tricuspid regurgitant velocity is 2.11 m/s, and with an assumed right atrial pressure of 3 mmHg, the estimated right ventricular systolic pressure is 20.8 mmHg.  Left Atrium: Left  atrial size was normal in size.  Right Atrium: Right atrial size was normal in size.  Pericardium: There is no evidence of pericardial effusion.  Mitral Valve: The mitral valve is normal in structure. Mild mitral annular calcification.  Trivial mitral valve regurgitation. No evidence of mitral valve stenosis.  Tricuspid Valve: The tricuspid valve is normal in structure. Tricuspid valve regurgitation is trivial.  Aortic Valve: The aortic valve was not well visualized. There is mild calcification of the aortic valve. There is mild thickening of the aortic valve. Aortic valve regurgitation is mild. Aortic regurgitation PHT measures 360 msec. Mild aortic valve sclerosis is present, with no evidence of aortic valve stenosis.  Pulmonic Valve: The pulmonic valve was not well visualized. Pulmonic valve regurgitation is trivial.  Aorta: The aortic root and ascending aorta are structurally normal, with no evidence of dilitation.  Venous: The inferior vena cava is normal in size with greater than 50% respiratory variability, suggesting right atrial pressure of 3 mmHg.  IAS/Shunts: No atrial level shunt detected by color flow Doppler.   LEFT VENTRICLE PLAX 2D LVIDd:         4.50 cm  Diastology LVIDs:         3.10 cm  LV e' medial:    10.10 cm/s LV PW:         1.00 cm  LV E/e' medial:  10.7 LV IVS:        1.10 cm  LV e' lateral:   12.00 cm/s LVOT diam:     2.00 cm  LV E/e' lateral: 9.0 LV SV:         83 LV SV Index:   36 LVOT Area:     3.14 cm   RIGHT VENTRICLE             IVC RV S prime:     12.00 cm/s  IVC diam: 1.70 cm TAPSE (M-mode): 2.1 cm RVSP:           20.8 mmHg  LEFT ATRIUM             Index       RIGHT ATRIUM           Index LA diam:        3.50 cm 1.53 cm/m  RA Pressure: 3.00 mmHg LA Vol (A2C):   37.2 ml 16.22 ml/m RA Area:     11.30 cm LA Vol (A4C):   49.8 ml 21.71 ml/m RA Volume:   24.10 ml  10.51 ml/m LA Biplane Vol: 43.9 ml 19.14 ml/m AORTIC VALVE LVOT Vmax:    128.00 cm/s LVOT Vmean:  78.800 cm/s LVOT VTI:    0.265 m AI PHT:      360 msec  AORTA Ao Root diam: 3.00 cm Ao Asc diam:  3.20 cm  MV E velocity: 108.00 cm/s  TRICUSPID VALVE MV A velocity: 80.50 cm/s   TR Peak grad:   17.8 mmHg MV E/A ratio:  1.34         TR Vmax:        211.00 cm/s Estimated RAP:  3.00 mmHg RVSP:           20.8 mmHg  SHUNTS Systemic VTI:  0.26 m Systemic Diam: 2.00 cm  Powell Sorrow MD Electronically signed by Powell Sorrow MD Signature Date/Time: 06/08/2020/11:01:18 AM    Final          ______________________________________________________________________________________________      Recent Labs: 02/17/2023: ALT 22; BUN 11; Creatinine, Ser 0.66; Hemoglobin 12.6; Platelets 248.0; Potassium 4.0; Sodium 141; TSH 1.95  Recent Lipid Panel    Component Value Date/Time   CHOL 210 (H) 04/03/2022 0942   TRIG 65 04/03/2022 0942   HDL 61 04/03/2022 0942  CHOLHDL 3.4 04/03/2022 0942   CHOLHDL 4 01/18/2020 1614   VLDL 26.0 01/18/2020 1614   LDLCALC 137 (H) 04/03/2022 0942    Physical Exam:    VS:  BP 109/75   Pulse 67   Ht 4' 11 (1.499 m)   Wt 220 lb (99.8 kg)   SpO2 96%   BMI 44.43 kg/m     Wt Readings from Last 3 Encounters:  01/25/24 220 lb (99.8 kg)  11/18/23 224 lb (101.6 kg)  02/17/23 226 lb (102.5 kg)    Gen: No distress,  morbid obesity  Neck: No JVD,  Cardiac: No Rubs or Gallops, soft diastolic murmur, RRR +2 radial pulses Respiratory: Clear to auscultation bilaterally, normal effort, normal  respiratory rate GI: Soft, nontender, non-distended  MS: No  edema;  moves all extremities Integument: Skin feels warm Neuro:  At time of evaluation, alert and oriented to person/place/time/situation  Psych: Normal affect, patient feels fair  ASSESSMENT/PLAN:    Aortic atherosclerosis and mixed hyperlipidemia Aortic atherosclerosis identified on CT abdomen pelvis in 2022. Mixed hyperlipidemia with a goal of LDL under 70  mg/dL. Current treatment includes rosuvastatin  5 mg. Consideration of increasing rosuvastatin  to 10 mg if Zepbound  is not started due to financial issues. - Will increase rosuvastatin  to 10 mg if Zepbound  is not started due to financial issues - Will recheck labs in the spring of 2026 after starting Zepbound   Essential hypertension Blood pressure is well-controlled on current regimen. - Continue olmesartan   Morbid obesity - prior surgical intervention Current treatment includes Ozempic with noted muscle loss and stagnant weight loss. Plans to start Zepbound  in 2026 pending financial arrangements. Support for gym attendance and additional support is recommended.  Discussed diet and exercise modifications.  Nonrheumatic aortic valve insufficiency (mild aortic regurgitation) Mild aortic regurgitation noted in 2022. No current symptoms such as chest pain, shortness of breath, or palpitations. - Ordered repeat echocardiogram  Longitudinal care: The evaluation and management services provided today reflect the complexity inherent in caring for this patient, including the ongoing longitudinal relationship and management of multiple chronic conditions and/or the need for care coordination. The visit required a comprehensive assessment and management plan tailored to the patient's unique needs Time was spent addressing not only the acute concerns but also the broader context of the patient's health, including preventive care, chronic disease management, and care coordination as appropriate.  Complex longitudinal is necessary for conditions including: secondary prevention in the setting of prior gastric surgery her work as a armed forces technical officer but with stagnant weight loss and HLD/aortic atherosclerosis    Stanly Leavens, MD FASE [facility] Cardiologist [facility]  [address] [place], [place] [address] [phone]  9:26 AM

## 2024-01-29 DIAGNOSIS — M25562 Pain in left knee: Secondary | ICD-10-CM | POA: Diagnosis not present

## 2024-02-02 ENCOUNTER — Ambulatory Visit (HOSPITAL_COMMUNITY)

## 2024-02-12 ENCOUNTER — Other Ambulatory Visit: Payer: Self-pay | Admitting: Internal Medicine

## 2024-03-02 ENCOUNTER — Emergency Department (HOSPITAL_BASED_OUTPATIENT_CLINIC_OR_DEPARTMENT_OTHER)
Admission: EM | Admit: 2024-03-02 | Discharge: 2024-03-02 | Disposition: A | Discharge status: Home / Self Care | Attending: Emergency Medicine | Admitting: Emergency Medicine

## 2024-03-02 ENCOUNTER — Emergency Department (HOSPITAL_BASED_OUTPATIENT_CLINIC_OR_DEPARTMENT_OTHER)

## 2024-03-02 ENCOUNTER — Other Ambulatory Visit: Payer: Self-pay

## 2024-03-02 ENCOUNTER — Ambulatory Visit: Payer: Self-pay

## 2024-03-02 DIAGNOSIS — J449 Chronic obstructive pulmonary disease, unspecified: Secondary | ICD-10-CM | POA: Diagnosis not present

## 2024-03-02 DIAGNOSIS — R109 Unspecified abdominal pain: Secondary | ICD-10-CM

## 2024-03-02 DIAGNOSIS — Z79899 Other long term (current) drug therapy: Secondary | ICD-10-CM | POA: Diagnosis not present

## 2024-03-02 DIAGNOSIS — R110 Nausea: Secondary | ICD-10-CM | POA: Insufficient documentation

## 2024-03-02 DIAGNOSIS — R1084 Generalized abdominal pain: Secondary | ICD-10-CM | POA: Insufficient documentation

## 2024-03-02 DIAGNOSIS — I10 Essential (primary) hypertension: Secondary | ICD-10-CM | POA: Insufficient documentation

## 2024-03-02 LAB — URINALYSIS, ROUTINE W REFLEX MICROSCOPIC
Bilirubin Urine: NEGATIVE
Glucose, UA: NEGATIVE mg/dL
Ketones, ur: NEGATIVE mg/dL
Nitrite: NEGATIVE
Protein, ur: NEGATIVE mg/dL
Specific Gravity, Urine: 1.02 (ref 1.005–1.030)
pH: 6 (ref 5.0–8.0)

## 2024-03-02 LAB — COMPREHENSIVE METABOLIC PANEL WITH GFR
ALT: 31 U/L (ref 0–44)
AST: 26 U/L (ref 15–41)
Albumin: 4.4 g/dL (ref 3.5–5.0)
Alkaline Phosphatase: 96 U/L (ref 38–126)
Anion gap: 10 (ref 5–15)
BUN: 10 mg/dL (ref 6–20)
CO2: 27 mmol/L (ref 22–32)
Calcium: 10.2 mg/dL (ref 8.9–10.3)
Chloride: 106 mmol/L (ref 98–111)
Creatinine, Ser: 0.8 mg/dL (ref 0.44–1.00)
GFR, Estimated: >60 mL/min
Glucose, Bld: 98 mg/dL (ref 70–99)
Potassium: 4.6 mmol/L (ref 3.5–5.1)
Sodium: 143 mmol/L (ref 135–145)
Total Bilirubin: 0.5 mg/dL (ref 0.0–1.2)
Total Protein: 7.7 g/dL (ref 6.5–8.1)

## 2024-03-02 LAB — LIPASE, BLOOD: Lipase: 81 U/L — ABNORMAL HIGH (ref 11–51)

## 2024-03-02 LAB — CBC
HCT: 43.5 % (ref 36.0–46.0)
Hemoglobin: 14.1 g/dL (ref 12.0–15.0)
MCH: 29.7 pg (ref 26.0–34.0)
MCHC: 32.4 g/dL (ref 30.0–36.0)
MCV: 91.8 fL (ref 80.0–100.0)
Platelets: 261 K/uL (ref 150–400)
RBC: 4.74 MIL/uL (ref 3.87–5.11)
RDW: 13.2 % (ref 11.5–15.5)
WBC: 7.5 K/uL (ref 4.0–10.5)
nRBC: 0 % (ref 0.0–0.2)

## 2024-03-02 MED ORDER — LACTATED RINGERS IV BOLUS
500.0000 mL | Freq: Once | INTRAVENOUS | Status: AC
  Administered 2024-03-02: 500 mL via INTRAVENOUS

## 2024-03-02 MED ORDER — ONDANSETRON HCL 4 MG PO TABS: 4.0000 mg | ORAL_TABLET | Freq: Four times a day (QID) | ORAL | 0 refills | Status: AC

## 2024-03-02 MED ORDER — ONDANSETRON 4 MG PO TBDP
4.0000 mg | ORAL_TABLET | Freq: Once | ORAL | Status: AC | PRN
  Administered 2024-03-02: 4 mg via ORAL
  Filled 2024-03-02: qty 1

## 2024-03-02 MED ORDER — LIDOCAINE VISCOUS HCL 2 % MT SOLN
15.0000 mL | Freq: Once | OROMUCOSAL | Status: DC
  Filled 2024-03-02: qty 15

## 2024-03-02 MED ORDER — DICYCLOMINE HCL 10 MG PO CAPS
10.0000 mg | ORAL_CAPSULE | Freq: Once | ORAL | Status: AC
  Administered 2024-03-02: 10 mg via ORAL
  Filled 2024-03-02: qty 1

## 2024-03-02 MED ORDER — ONDANSETRON HCL 4 MG/2ML IJ SOLN
4.0000 mg | Freq: Once | INTRAMUSCULAR | Status: AC
  Administered 2024-03-02: 4 mg via INTRAVENOUS
  Filled 2024-03-02: qty 2

## 2024-03-02 MED ORDER — CEPHALEXIN 500 MG PO CAPS: 500.0000 mg | ORAL_CAPSULE | Freq: Two times a day (BID) | ORAL | 0 refills | Status: AC

## 2024-03-02 MED ORDER — ALUM & MAG HYDROXIDE-SIMETH 200-200-20 MG/5ML PO SUSP
30.0000 mL | Freq: Once | ORAL | Status: DC
  Filled 2024-03-02: qty 30

## 2024-03-02 MED ORDER — IOHEXOL 300 MG/ML  SOLN
100.0000 mL | Freq: Once | INTRAMUSCULAR | Status: AC | PRN
  Administered 2024-03-02: 100 mL via INTRAVENOUS

## 2024-03-02 NOTE — ED Notes (Signed)
 ED Provider at bedside.

## 2024-03-02 NOTE — ED Notes (Signed)
 Patient transported to CT

## 2024-03-02 NOTE — ED Triage Notes (Signed)
 Pt reports fatigue, body aches, hot flashes and nausea and ear pressure, denies fever or upper respiratory symptoms.

## 2024-03-02 NOTE — Telephone Encounter (Signed)
 FYI Only or Action Required?: FYI only for provider: ED advised.  Patient was last seen in primary care on 11/18/2023 by Rollene Almarie LABOR, MD.  Called Nurse Triage reporting Fatigue, Nausea, and Neck Pain.  Symptoms began 3 days ago.  Interventions attempted: Cold rag  Symptoms are: gradually worsening.  Triage Disposition: Go to ED Now (or PCP Triage)  Patient/caregiver understands and will follow disposition?: Yes            Copied from CRM [id number]. Topic: Clinical - Red Word Triage >> Mar 02, 2024 11:17 AM Christine Ramirez wrote: Red Word that prompted transfer to Nurse Triage: Patient thought she had a stomach bug, into 3rd day having constant sweating, cant get cool down, havent been able to eat much, believes she is dehydrated feels feverish. Nausea , dry mouth havent been able to sleep very weak to even drive Reason for Disposition  [1] Drinking very little AND [2] dehydration suspected (e.g., no urine > 12 hours, very dry mouth, very lightheaded)  Answer Assessment - Initial Assessment Questions Patient reports 3rd day of symptoms: Headache comes and goes, sweating/hot flashes, nausea, fatigue/weakness, up all night/difficulty sleeping, numbness or tingling in hands, dry mouth, pale, neck pain spreads to upper back and shoulders; severe cramping in lower abdomen resolved after BM.  She states she has had about 20 oz of fluids in the past 24 hours.  No chest pain or pain radiating to left arm/neck/jaw; unsure of fever- has not checked temperature, no cough or sneezing, diarrhea or vomiting.  Triage stopped due to RN advised ED for suspected dehydration as well as possible cardiac symptoms (indigestion, sweating, nausea, upper back pain). Patient is agreeable to have her husband drive her.  Protocols used: Nausea-A-AH

## 2024-03-02 NOTE — Discharge Instructions (Addendum)
 Thank you for visiting the Emergency Department today. It was a pleasure to be part of your healthcare team.   Your were seen today for abdominal pain, and your test results showed a possible UTI - you have been treated with antibiotics. You should take your medications as directed. If you have any questions about your medicines, please call your pharmacy or healthcare provider.  It is important to watch for warning signs such as worsening pain, fever, trouble breathing, or chest pain. If any of these happen, return to the Emergency Department or call 911.  Thank you for trusting us  with your health.

## 2024-03-02 NOTE — ED Provider Notes (Signed)
 " [place] EMERGENCY DEPARTMENT AT [facility] Provider Note   CSN: [id number] Arrival date & time: 03/02/24  1402     Patient presents with: Nausea   Christine Ramirez is a 59 y.o. female with a past medical history of anxiety, who presents with abdominal pain ongoing for 2 days.  The pain is described as generalized cramping, wax/wane, with no clear radiation.  The patient reports associated symptoms including nausea and some urinary discomfort but denies fever, chills, chest pain, shortness of breath, persistent vomiting, hematemesis, melena, hematochezia, or hematuria.  There is no history of recent abdominal trauma.  The patient reports no recent travel, antibiotic use, or known sick contacts.  Oral intake has been decreased as patient states she is not drinking enough water and bowel movements are regular, with last bowel movement this morning. Patient is concerned this may be complications from her GLP1 shot. No alleviating or exacerbating factors were clearly identified.  The patient is in no acute distress.    HPI     Prior to Admission medications  Medication Sig Start Date End Date Taking? Authorizing Provider  cephALEXin  (KEFLEX ) 500 MG capsule Take 1 capsule (500 mg total) by mouth 2 (two) times daily for 7 days. 03/02/24 03/09/24 Yes Neva Ramaswamy L, PA  ondansetron  (ZOFRAN ) 4 MG tablet Take 1 tablet (4 mg total) by mouth every 6 (six) hours for 7 days. 03/02/24 03/09/24 Yes Walaa Carel L, PA  DULoxetine  (CYMBALTA ) 60 MG capsule Take 2 capsules (120 mg total) by mouth daily. 11/18/23   Rollene Almarie LABOR, MD  gabapentin  (NEURONTIN ) 100 MG capsule TAKE 1-3 CAPSULES BY MOUTH EVERY DAY AT BEDTIME AS NEEDED 05/31/19   [provider]  nystatin -triamcinolone  ointment (MYCOLOG) APPLY TO AFFECTED AREA TWICE A DAY 05/20/23   Rollene Almarie LABOR, MD  olmesartan  (BENICAR ) 20 MG tablet Take 1 tablet (20 mg total) by mouth daily. 11/18/23   Rollene Almarie LABOR, MD  rosuvastatin   (CRESTOR ) 5 MG tablet TAKE 1 TABLET (5 MG TOTAL) BY MOUTH DAILY. 02/12/24   Chandrasekhar, Stanly LABOR, MD  Semaglutide,0.25 or 0.5MG /DOS, (OZEMPIC, 0.25 OR 0.5 MG/DOSE,) 2 MG/3ML SOPN Inject into the skin once a week.    [provider]  tirzepatide  (ZEPBOUND ) 10 MG/0.5ML Pen Inject 10 mg into the skin once a week. 11/18/23   Rollene Almarie LABOR, MD  tirzepatide  (ZEPBOUND ) 2.5 MG/0.5ML Pen Inject 2.5 mg into the skin once a week. 11/18/23   Rollene Almarie LABOR, MD  tirzepatide  (ZEPBOUND ) 5 MG/0.5ML Pen Inject 5 mg into the skin once a week. 11/18/23   Rollene Almarie LABOR, MD  tirzepatide  (ZEPBOUND ) 7.5 MG/0.5ML Pen Inject 7.5 mg into the skin once a week. 11/18/23   Rollene Almarie LABOR, MD    Allergies: Patient has no known allergies.    Review of Systems  Gastrointestinal:  Positive for abdominal pain.    Updated Vital Signs BP (!) 114/59   Pulse 66   Temp 98 F (36.7 C) (Oral)   Resp 16   LMP  (LMP Unknown) Comment: patient reports she believes her LMP was >1 year ago  SpO2 96%   Physical Exam Constitutional:      General: She is awake. She is not in acute distress.    Appearance: She is not toxic-appearing.  HENT:     Head: Normocephalic and atraumatic.     Right Ear: Hearing normal.     Left Ear: Hearing normal.     Nose: Nose normal.  Eyes:  Extraocular Movements: Extraocular movements intact.     Conjunctiva/sclera: Conjunctivae normal.     Pupils: Pupils are equal, round, and reactive to light.  Cardiovascular:     Rate and Rhythm: Normal rate and regular rhythm.     Pulses: Normal pulses.  Pulmonary:     Effort: Pulmonary effort is normal.  Abdominal:     General: Abdomen is flat. Bowel sounds are normal.     Palpations: Abdomen is soft.     Tenderness: There is abdominal tenderness.     Comments: Generalized abdominal tenderness. No CVA tenderness, guarding, or rebound. No hernia appreciated on exam. There is no ecchymosis or additional skin changes  to the abdomen.   Musculoskeletal:        General: Normal range of motion.     Cervical back: Normal range of motion.  Skin:    General: Skin is warm and dry.     Capillary Refill: Capillary refill takes less than 2 seconds.  Neurological:     General: No focal deficit present.     Mental Status: She is alert.  Psychiatric:        Mood and Affect: Mood normal.     (all labs ordered are listed, but only abnormal results are displayed) Labs Reviewed  LIPASE, BLOOD - Abnormal; Notable for the following components:      Result Value   Lipase 81 (*)    All other components within normal limits  URINALYSIS, ROUTINE W REFLEX MICROSCOPIC - Abnormal; Notable for the following components:   Hgb urine dipstick MODERATE (*)    Leukocytes,Ua SMALL (*)    Bacteria, UA FEW (*)    All other components within normal limits  COMPREHENSIVE METABOLIC PANEL WITH GFR  CBC    EKG: EKG Interpretation Date/Time:  Wednesday March 02 2024 14:35:27 EST Ventricular Rate:  78 PR Interval:  148 QRS Duration:  80 QT Interval:  371 QTC Calculation: 423 R Axis:   32  Text Interpretation: Sinus rhythm Low voltage, precordial leads similar to prior Confirmed by Ginger Barefoot ([id number]) on 03/03/2024 11:35:00 AM  Radiology: No results found.    Procedures   Medications Ordered in the ED  ondansetron  (ZOFRAN -ODT) disintegrating tablet 4 mg (4 mg Oral Given 03/02/24 1437)  dicyclomine  (BENTYL ) capsule 10 mg (10 mg Oral Given 03/02/24 1936)  lactated ringers  bolus 500 mL (0 mLs Intravenous Stopped 03/02/24 2046)  ondansetron  (ZOFRAN ) injection 4 mg (4 mg Intravenous Given 03/02/24 1935)  iohexol  (OMNIPAQUE ) 300 MG/ML solution 100 mL (100 mLs Intravenous Contrast Given 03/02/24 1853)                                    Medical Decision Making Amount and/or Complexity of Data Reviewed Labs: ordered. Radiology: ordered.  Risk OTC drugs. Prescription drug management.   Patient presents to the ED for:  Abdominal pain This involves an extensive number of treatment options   Differential diagnosis includes:  Gastritis/gastroenteritis Pancreatitis Other intra-abdominal pathology UTI/urolithiasis Co-morbid conditions: COPD, GERD, hypertension  Additional history/records obtained and reviewed: Additional history obtained from  husband - who presents as good historian   Data Reviewed / Actions Taken: Labs ordered -mildly elevated lipase at 81. Imaging ordered -CT abdomen without acute intra-abdominal findings.  I agree with the radiologists interpretation.  EKG ordered -normal sinus rhythm.  Management / Treatments: Patient given ondansetron , dicyclomine , and LR fluid bolus for symptomatic relief-well-tolerated  reevaluation  of the patient after these medicines showed that the patient improved. I have reviewed the patients home medicines and have made adjustments as needed  ED Course / Reassessments: Problem List:  59 year old female presented for abdominal pain. Initial assessment included history, physical exam, and review of prior medical records. Laboratory studies, imaging, and other ancillary studies were obtained and key results included mildly elevated lipase and urinalysis that was indicative of acute cystitis. Management included antiemetics, antispasmodics, and IV fluids, with ongoing reassessment. Vital signs were obtained and monitored, and the patient remained stable throughout the stay. The patient showed improvement with ED treatment regimen.  Patient's discomfort likely from ongoing UTI.  Patient had a slight elevation in her lipase, likely from recently started GLP-1-patient was advised to contact her primary care before administering her next week's dose in case abdominal pain is from possible adverse reaction to GLP-1 dosing.  Patient to be discharged on antibiotic regimen for UTI with close follow-up with PCP for further evaluation and care.  Disposition: Disposition:  Discharge with close follow-up with PCP for further evaluation and care  Rationale for disposition: stable for discharge   The disposition plan and rationale were discussed with the patient at the bedside, all questions were addressed, and the patient demonstrated understanding.  This note was produced using [employer]. While I have reviewed and verified all clinical information, transcription errors may remain.      Final diagnoses:  Abdominal pain, unspecified abdominal location    ED Discharge Orders          Ordered    cephALEXin  (KEFLEX ) 500 MG capsule  2 times daily        03/02/24 2149    ondansetron  (ZOFRAN ) 4 MG tablet  Every 6 hours        03/02/24 2149               Willma Duwaine CROME, [place] 03/08/24 1328    Dreama Longs, MD 03/08/24 2240  "

## 2024-03-21 ENCOUNTER — Other Ambulatory Visit: Payer: Self-pay | Admitting: Internal Medicine

## 2024-03-21 DIAGNOSIS — I10 Essential (primary) hypertension: Secondary | ICD-10-CM

## 2024-03-22 ENCOUNTER — Other Ambulatory Visit: Payer: Self-pay | Admitting: Internal Medicine
